# Patient Record
Sex: Male | Born: 1940 | Race: White | Hispanic: No | Marital: Married | Smoking: Former smoker
Health system: Southern US, Community
[De-identification: ages and names within clinical notes are randomized; demographics above are authoritative.]

## PROBLEM LIST (undated history)

## (undated) DIAGNOSIS — R0989 Other specified symptoms and signs involving the circulatory and respiratory systems: Secondary | ICD-10-CM

## (undated) DIAGNOSIS — E785 Hyperlipidemia, unspecified: Secondary | ICD-10-CM

## (undated) DIAGNOSIS — Z951 Presence of aortocoronary bypass graft: Secondary | ICD-10-CM

## (undated) DIAGNOSIS — Z87442 Personal history of urinary calculi: Secondary | ICD-10-CM

## (undated) DIAGNOSIS — C4491 Basal cell carcinoma of skin, unspecified: Secondary | ICD-10-CM

## (undated) DIAGNOSIS — H269 Unspecified cataract: Secondary | ICD-10-CM

## (undated) DIAGNOSIS — Z5189 Encounter for other specified aftercare: Secondary | ICD-10-CM

## (undated) DIAGNOSIS — J189 Pneumonia, unspecified organism: Secondary | ICD-10-CM

## (undated) DIAGNOSIS — K5790 Diverticulosis of intestine, part unspecified, without perforation or abscess without bleeding: Secondary | ICD-10-CM

## (undated) DIAGNOSIS — M1990 Unspecified osteoarthritis, unspecified site: Secondary | ICD-10-CM

## (undated) DIAGNOSIS — I10 Essential (primary) hypertension: Secondary | ICD-10-CM

## (undated) DIAGNOSIS — R112 Nausea with vomiting, unspecified: Secondary | ICD-10-CM

## (undated) DIAGNOSIS — T8859XA Other complications of anesthesia, initial encounter: Secondary | ICD-10-CM

## (undated) DIAGNOSIS — Z8489 Family history of other specified conditions: Secondary | ICD-10-CM

## (undated) DIAGNOSIS — I509 Heart failure, unspecified: Secondary | ICD-10-CM

## (undated) DIAGNOSIS — Z9889 Other specified postprocedural states: Secondary | ICD-10-CM

## (undated) DIAGNOSIS — T4145XA Adverse effect of unspecified anesthetic, initial encounter: Secondary | ICD-10-CM

## (undated) DIAGNOSIS — C4490 Unspecified malignant neoplasm of skin, unspecified: Secondary | ICD-10-CM

## (undated) DIAGNOSIS — R911 Solitary pulmonary nodule: Secondary | ICD-10-CM

## (undated) DIAGNOSIS — H3530 Unspecified macular degeneration: Secondary | ICD-10-CM

## (undated) DIAGNOSIS — I7100 Dissection of unspecified site of aorta: Secondary | ICD-10-CM

## (undated) DIAGNOSIS — I712 Thoracic aortic aneurysm, without rupture: Secondary | ICD-10-CM

## (undated) DIAGNOSIS — T7840XA Allergy, unspecified, initial encounter: Secondary | ICD-10-CM

## (undated) DIAGNOSIS — C61 Malignant neoplasm of prostate: Secondary | ICD-10-CM

## (undated) DIAGNOSIS — I499 Cardiac arrhythmia, unspecified: Secondary | ICD-10-CM

## (undated) HISTORY — DX: Unspecified macular degeneration: H35.30

## (undated) HISTORY — PX: OTHER SURGICAL HISTORY: SHX169

## (undated) HISTORY — PX: POLYPECTOMY: SHX149

## (undated) HISTORY — DX: Solitary pulmonary nodule: R91.1

## (undated) HISTORY — DX: Diverticulosis of intestine, part unspecified, without perforation or abscess without bleeding: K57.90

## (undated) HISTORY — DX: Thoracic aortic aneurysm, without rupture: I71.2

## (undated) HISTORY — DX: Hyperlipidemia, unspecified: E78.5

## (undated) HISTORY — DX: Encounter for other specified aftercare: Z51.89

## (undated) HISTORY — DX: Essential (primary) hypertension: I10

## (undated) HISTORY — DX: Dissection of unspecified site of aorta: I71.00

## (undated) HISTORY — DX: Cardiac arrhythmia, unspecified: I49.9

## (undated) HISTORY — DX: Unspecified malignant neoplasm of skin, unspecified: C44.90

## (undated) HISTORY — DX: Allergy, unspecified, initial encounter: T78.40XA

## (undated) HISTORY — PX: COLONOSCOPY: SHX174

## (undated) HISTORY — DX: Pneumonia, unspecified organism: J18.9

## (undated) HISTORY — DX: Unspecified cataract: H26.9

## (undated) HISTORY — DX: Basal cell carcinoma of skin, unspecified: C44.91

## (undated) HISTORY — DX: Other specified symptoms and signs involving the circulatory and respiratory systems: R09.89

## (undated) HISTORY — DX: Malignant neoplasm of prostate: C61

---

## 1993-12-11 HISTORY — PX: PROSTATECTOMY: SHX69

## 1994-08-13 HISTORY — PX: PENILE PROSTHESIS IMPLANT: SHX240

## 1995-03-14 ENCOUNTER — Encounter: Payer: Self-pay | Admitting: Family Medicine

## 1999-05-14 ENCOUNTER — Encounter: Payer: Self-pay | Admitting: Family Medicine

## 1999-05-14 LAB — CONVERTED CEMR LAB: PSA: 0 ng/mL

## 1999-06-14 HISTORY — PX: OTHER SURGICAL HISTORY: SHX169

## 1999-08-21 ENCOUNTER — Other Ambulatory Visit: Admission: RE | Admit: 1999-08-21 | Discharge: 1999-08-21 | Payer: Self-pay | Admitting: Gastroenterology

## 1999-08-21 ENCOUNTER — Encounter (INDEPENDENT_AMBULATORY_CARE_PROVIDER_SITE_OTHER): Payer: Self-pay

## 2000-04-05 ENCOUNTER — Other Ambulatory Visit: Admission: RE | Admit: 2000-04-05 | Discharge: 2000-04-05 | Payer: Self-pay | Admitting: General Surgery

## 2000-05-13 HISTORY — PX: OTHER SURGICAL HISTORY: SHX169

## 2000-06-12 ENCOUNTER — Encounter (INDEPENDENT_AMBULATORY_CARE_PROVIDER_SITE_OTHER): Payer: Self-pay | Admitting: *Deleted

## 2000-06-12 ENCOUNTER — Ambulatory Visit (HOSPITAL_COMMUNITY): Admission: RE | Admit: 2000-06-12 | Discharge: 2000-06-12 | Payer: Self-pay | Admitting: Orthopedic Surgery

## 2000-06-12 ENCOUNTER — Encounter: Payer: Self-pay | Admitting: Orthopedic Surgery

## 2000-12-11 ENCOUNTER — Encounter: Payer: Self-pay | Admitting: Family Medicine

## 2000-12-11 LAB — CONVERTED CEMR LAB: PSA: 0 ng/mL

## 2002-02-10 ENCOUNTER — Encounter: Payer: Self-pay | Admitting: Family Medicine

## 2003-03-14 ENCOUNTER — Encounter: Payer: Self-pay | Admitting: Family Medicine

## 2003-03-14 LAB — CONVERTED CEMR LAB: PSA: 0 ng/mL

## 2004-07-28 ENCOUNTER — Ambulatory Visit: Payer: Self-pay | Admitting: Family Medicine

## 2005-05-14 ENCOUNTER — Ambulatory Visit: Payer: Self-pay | Admitting: Family Medicine

## 2005-05-14 LAB — CONVERTED CEMR LAB: PSA: 0 ng/mL

## 2005-05-15 ENCOUNTER — Ambulatory Visit: Payer: Self-pay | Admitting: Family Medicine

## 2005-06-27 ENCOUNTER — Ambulatory Visit: Payer: Self-pay | Admitting: Family Medicine

## 2005-10-11 HISTORY — PX: WISDOM TOOTH EXTRACTION: SHX21

## 2006-01-23 ENCOUNTER — Ambulatory Visit: Payer: Self-pay | Admitting: Family Medicine

## 2006-05-16 ENCOUNTER — Ambulatory Visit: Payer: Self-pay | Admitting: Family Medicine

## 2006-05-28 ENCOUNTER — Ambulatory Visit: Payer: Self-pay | Admitting: Family Medicine

## 2006-06-13 HISTORY — PX: OTHER SURGICAL HISTORY: SHX169

## 2006-07-08 ENCOUNTER — Ambulatory Visit: Payer: Self-pay | Admitting: Family Medicine

## 2006-07-09 ENCOUNTER — Ambulatory Visit: Payer: Self-pay | Admitting: Family Medicine

## 2007-05-07 ENCOUNTER — Encounter: Payer: Self-pay | Admitting: Family Medicine

## 2007-05-07 DIAGNOSIS — I10 Essential (primary) hypertension: Secondary | ICD-10-CM

## 2007-05-07 DIAGNOSIS — E781 Pure hyperglyceridemia: Secondary | ICD-10-CM

## 2007-06-09 ENCOUNTER — Ambulatory Visit: Payer: Self-pay | Admitting: Family Medicine

## 2007-06-09 LAB — CONVERTED CEMR LAB
ALT: 30 units/L (ref 0–53)
AST: 36 units/L (ref 0–37)
Alkaline Phosphatase: 40 units/L (ref 39–117)
BUN: 15 mg/dL (ref 6–23)
Basophils Relative: 0.4 % (ref 0.0–1.0)
Bilirubin, Direct: 0.2 mg/dL (ref 0.0–0.3)
CO2: 33 meq/L — ABNORMAL HIGH (ref 19–32)
Calcium: 9.6 mg/dL (ref 8.4–10.5)
Creatinine, Ser: 0.9 mg/dL (ref 0.4–1.5)
Eosinophils Relative: 8.6 % — ABNORMAL HIGH (ref 0.0–5.0)
Glucose, Bld: 99 mg/dL (ref 70–99)
HDL: 36.9 mg/dL — ABNORMAL LOW (ref >39.0)
Hemoglobin: 16 g/dL (ref 13.0–17.0)
LDL Cholesterol: 94 mg/dL (ref 0–99)
Lymphocytes Relative: 36.4 % (ref 12.0–46.0)
Monocytes Absolute: 0.9 10*3/uL — ABNORMAL HIGH (ref 0.2–0.7)
Monocytes Relative: 11.1 % — ABNORMAL HIGH (ref 3.0–11.0)
Neutro Abs: 3.4 10*3/uL (ref 1.4–7.7)
Platelets: 181 10*3/uL (ref 150–400)
RDW: 13.1 % (ref 11.5–14.6)
Total Bilirubin: 0.9 mg/dL (ref 0.3–1.2)
Total Protein: 7.5 g/dL (ref 6.0–8.3)
VLDL: 14 mg/dL (ref 0–40)
WBC: 7.8 10*3/uL (ref 4.5–10.5)

## 2007-06-11 ENCOUNTER — Ambulatory Visit: Payer: Self-pay | Admitting: Family Medicine

## 2007-06-19 ENCOUNTER — Ambulatory Visit: Payer: Self-pay | Admitting: Family Medicine

## 2007-07-01 ENCOUNTER — Ambulatory Visit: Payer: Self-pay | Admitting: Family Medicine

## 2007-07-01 LAB — FECAL OCCULT BLOOD, GUAIAC: Fecal Occult Blood: NEGATIVE

## 2007-07-02 ENCOUNTER — Encounter (INDEPENDENT_AMBULATORY_CARE_PROVIDER_SITE_OTHER): Payer: Self-pay | Admitting: *Deleted

## 2007-11-03 ENCOUNTER — Telehealth (INDEPENDENT_AMBULATORY_CARE_PROVIDER_SITE_OTHER): Payer: Self-pay | Admitting: *Deleted

## 2008-02-18 ENCOUNTER — Ambulatory Visit: Payer: Self-pay | Admitting: Internal Medicine

## 2008-02-18 LAB — CONVERTED CEMR LAB
Nitrite: NEGATIVE
Specific Gravity, Urine: 1.01
Urobilinogen, UA: 0.2
WBC Urine, dipstick: NEGATIVE

## 2008-03-22 ENCOUNTER — Telehealth (INDEPENDENT_AMBULATORY_CARE_PROVIDER_SITE_OTHER): Payer: Self-pay | Admitting: Internal Medicine

## 2008-04-21 ENCOUNTER — Ambulatory Visit: Payer: Self-pay | Admitting: Gastroenterology

## 2008-05-05 ENCOUNTER — Encounter: Payer: Self-pay | Admitting: Gastroenterology

## 2008-05-05 ENCOUNTER — Ambulatory Visit: Payer: Self-pay | Admitting: Gastroenterology

## 2008-05-07 ENCOUNTER — Encounter: Payer: Self-pay | Admitting: Gastroenterology

## 2008-05-26 ENCOUNTER — Encounter: Payer: Self-pay | Admitting: Family Medicine

## 2008-06-14 ENCOUNTER — Ambulatory Visit: Payer: Self-pay | Admitting: Family Medicine

## 2008-06-14 LAB — CONVERTED CEMR LAB
ALT: 31 units/L (ref 0–53)
Albumin: 4.4 g/dL (ref 3.5–5.2)
Alkaline Phosphatase: 41 units/L (ref 39–117)
BUN: 15 mg/dL (ref 6–23)
Basophils Relative: 0.6 % (ref 0.0–3.0)
Bilirubin, Direct: 0.2 mg/dL (ref 0.0–0.3)
CO2: 32 meq/L (ref 19–32)
Calcium: 9.6 mg/dL (ref 8.4–10.5)
Cholesterol: 154 mg/dL (ref 0–200)
Glucose, Bld: 93 mg/dL (ref 70–99)
LDL Cholesterol: 99 mg/dL (ref 0–99)
Lymphocytes Relative: 32.1 % (ref 12.0–46.0)
MCHC: 33.2 g/dL (ref 30.0–36.0)
Monocytes Relative: 9.9 % (ref 3.0–12.0)
Neutrophils Relative %: 47.8 % (ref 43.0–77.0)
RBC: 5.66 M/uL (ref 4.22–5.81)
Sodium: 144 meq/L (ref 135–145)
Total CHOL/HDL Ratio: 4
Total Protein: 7.7 g/dL (ref 6.0–8.3)
VLDL: 17 mg/dL (ref 0–40)
WBC: 9.7 10*3/uL (ref 4.5–10.5)

## 2008-06-15 ENCOUNTER — Ambulatory Visit: Payer: Self-pay | Admitting: Family Medicine

## 2008-08-13 DIAGNOSIS — I7100 Dissection of unspecified site of aorta: Secondary | ICD-10-CM

## 2008-08-13 HISTORY — DX: Dissection of unspecified site of aorta: I71.00

## 2008-08-17 ENCOUNTER — Telehealth: Payer: Self-pay | Admitting: Family Medicine

## 2008-12-08 ENCOUNTER — Ambulatory Visit: Payer: Self-pay | Admitting: Family Medicine

## 2008-12-08 LAB — CONVERTED CEMR LAB: PSA: 0 ng/mL — ABNORMAL LOW (ref 0.10–4.00)

## 2008-12-27 ENCOUNTER — Ambulatory Visit: Payer: Self-pay | Admitting: Family Medicine

## 2009-04-03 ENCOUNTER — Encounter: Payer: Self-pay | Admitting: Internal Medicine

## 2009-04-03 ENCOUNTER — Emergency Department (HOSPITAL_COMMUNITY): Admission: EM | Admit: 2009-04-03 | Discharge: 2009-04-03 | Payer: Self-pay | Admitting: Emergency Medicine

## 2009-04-03 ENCOUNTER — Encounter: Payer: Self-pay | Admitting: Family Medicine

## 2009-04-03 DIAGNOSIS — I359 Nonrheumatic aortic valve disorder, unspecified: Secondary | ICD-10-CM

## 2009-04-03 DIAGNOSIS — I2510 Atherosclerotic heart disease of native coronary artery without angina pectoris: Secondary | ICD-10-CM

## 2009-04-06 ENCOUNTER — Encounter: Payer: Self-pay | Admitting: Family Medicine

## 2009-04-09 ENCOUNTER — Encounter: Payer: Self-pay | Admitting: Family Medicine

## 2009-04-13 ENCOUNTER — Encounter: Payer: Self-pay | Admitting: Family Medicine

## 2009-04-20 ENCOUNTER — Ambulatory Visit: Payer: Self-pay | Admitting: Family Medicine

## 2009-04-21 ENCOUNTER — Ambulatory Visit: Payer: Self-pay | Admitting: Internal Medicine

## 2009-04-23 ENCOUNTER — Emergency Department (HOSPITAL_COMMUNITY): Admission: EM | Admit: 2009-04-23 | Discharge: 2009-04-23 | Payer: Self-pay | Admitting: Emergency Medicine

## 2009-04-23 ENCOUNTER — Ambulatory Visit: Payer: Self-pay | Admitting: Internal Medicine

## 2009-04-25 ENCOUNTER — Encounter: Payer: Self-pay | Admitting: Cardiology

## 2009-04-25 ENCOUNTER — Ambulatory Visit: Payer: Self-pay | Admitting: Internal Medicine

## 2009-04-25 DIAGNOSIS — I7100 Dissection of unspecified site of aorta: Secondary | ICD-10-CM

## 2009-04-26 ENCOUNTER — Telehealth: Payer: Self-pay | Admitting: Internal Medicine

## 2009-04-29 ENCOUNTER — Encounter: Payer: Self-pay | Admitting: Internal Medicine

## 2009-05-03 ENCOUNTER — Encounter: Payer: Self-pay | Admitting: Internal Medicine

## 2009-05-03 ENCOUNTER — Ambulatory Visit: Payer: Self-pay

## 2009-05-04 ENCOUNTER — Encounter: Payer: Self-pay | Admitting: Family Medicine

## 2009-05-04 ENCOUNTER — Encounter: Payer: Self-pay | Admitting: Internal Medicine

## 2009-05-05 ENCOUNTER — Encounter (INDEPENDENT_AMBULATORY_CARE_PROVIDER_SITE_OTHER): Payer: Self-pay | Admitting: *Deleted

## 2009-05-05 ENCOUNTER — Telehealth: Payer: Self-pay | Admitting: Internal Medicine

## 2009-05-05 ENCOUNTER — Encounter: Payer: Self-pay | Admitting: Internal Medicine

## 2009-05-11 ENCOUNTER — Encounter (INDEPENDENT_AMBULATORY_CARE_PROVIDER_SITE_OTHER): Payer: Self-pay | Admitting: *Deleted

## 2009-05-11 ENCOUNTER — Ambulatory Visit: Payer: Self-pay | Admitting: Internal Medicine

## 2009-05-11 DIAGNOSIS — I4892 Unspecified atrial flutter: Secondary | ICD-10-CM

## 2009-05-11 DIAGNOSIS — I429 Cardiomyopathy, unspecified: Secondary | ICD-10-CM

## 2009-05-12 ENCOUNTER — Encounter: Payer: Self-pay | Admitting: Internal Medicine

## 2009-05-17 ENCOUNTER — Ambulatory Visit: Payer: Self-pay | Admitting: Family Medicine

## 2009-05-19 ENCOUNTER — Ambulatory Visit: Payer: Self-pay | Admitting: Cardiovascular Disease

## 2009-05-27 ENCOUNTER — Ambulatory Visit: Payer: Self-pay | Admitting: Internal Medicine

## 2009-05-27 DIAGNOSIS — R0989 Other specified symptoms and signs involving the circulatory and respiratory systems: Secondary | ICD-10-CM | POA: Insufficient documentation

## 2009-05-30 ENCOUNTER — Ambulatory Visit: Payer: Self-pay | Admitting: Family Medicine

## 2009-06-01 ENCOUNTER — Encounter: Payer: Self-pay | Admitting: Internal Medicine

## 2009-06-13 ENCOUNTER — Encounter: Payer: Self-pay | Admitting: Internal Medicine

## 2009-06-21 ENCOUNTER — Ambulatory Visit: Payer: Self-pay | Admitting: Family Medicine

## 2009-06-21 LAB — CONVERTED CEMR LAB
Albumin: 3.9 g/dL (ref 3.5–5.2)
Alkaline Phosphatase: 42 units/L (ref 39–117)
BUN: 15 mg/dL (ref 6–23)
Basophils Absolute: 0 10*3/uL (ref 0.0–0.1)
CO2: 30 meq/L (ref 19–32)
Calcium: 9.1 mg/dL (ref 8.4–10.5)
Cholesterol: 135 mg/dL (ref 0–200)
Creatinine, Ser: 1 mg/dL (ref 0.4–1.5)
Glucose, Bld: 91 mg/dL (ref 70–99)
HDL: 34.2 mg/dL — ABNORMAL LOW (ref >39.00)
Hemoglobin: 13.1 g/dL (ref 13.0–17.0)
Lymphocytes Relative: 18.9 % (ref 12.0–46.0)
Monocytes Relative: 11.6 % (ref 3.0–12.0)
Neutro Abs: 4.9 10*3/uL (ref 1.4–7.7)
RBC: 4.53 M/uL (ref 4.22–5.81)
RDW: 14.4 % (ref 11.5–14.6)
Total Protein: 7.8 g/dL (ref 6.0–8.3)
Triglycerides: 73 mg/dL (ref 0.0–149.0)

## 2009-06-23 ENCOUNTER — Telehealth: Payer: Self-pay | Admitting: Internal Medicine

## 2009-06-28 ENCOUNTER — Ambulatory Visit: Payer: Self-pay | Admitting: Family Medicine

## 2009-07-05 ENCOUNTER — Ambulatory Visit: Payer: Self-pay | Admitting: Internal Medicine

## 2009-07-06 ENCOUNTER — Telehealth (INDEPENDENT_AMBULATORY_CARE_PROVIDER_SITE_OTHER): Payer: Self-pay | Admitting: *Deleted

## 2009-07-08 ENCOUNTER — Encounter: Payer: Self-pay | Admitting: Internal Medicine

## 2009-07-11 ENCOUNTER — Ambulatory Visit: Payer: Self-pay

## 2009-07-11 ENCOUNTER — Encounter (HOSPITAL_COMMUNITY): Admission: RE | Admit: 2009-07-11 | Discharge: 2009-08-11 | Payer: Self-pay | Admitting: Internal Medicine

## 2009-07-11 ENCOUNTER — Ambulatory Visit: Payer: Self-pay | Admitting: Cardiology

## 2009-07-13 ENCOUNTER — Encounter: Payer: Self-pay | Admitting: Internal Medicine

## 2009-07-15 ENCOUNTER — Telehealth: Payer: Self-pay | Admitting: Internal Medicine

## 2009-07-18 ENCOUNTER — Ambulatory Visit: Payer: Self-pay | Admitting: Cardiology

## 2009-07-18 ENCOUNTER — Ambulatory Visit (HOSPITAL_COMMUNITY): Admission: RE | Admit: 2009-07-18 | Discharge: 2009-07-18 | Payer: Self-pay | Admitting: Cardiology

## 2009-08-01 ENCOUNTER — Telehealth: Payer: Self-pay | Admitting: Family Medicine

## 2009-09-01 ENCOUNTER — Ambulatory Visit: Payer: Self-pay | Admitting: Internal Medicine

## 2009-09-20 ENCOUNTER — Encounter: Payer: Self-pay | Admitting: Internal Medicine

## 2009-09-20 ENCOUNTER — Ambulatory Visit: Payer: Self-pay

## 2009-09-29 ENCOUNTER — Ambulatory Visit: Payer: Self-pay | Admitting: Internal Medicine

## 2009-11-22 ENCOUNTER — Ambulatory Visit: Payer: Self-pay | Admitting: Internal Medicine

## 2009-11-22 DIAGNOSIS — I493 Ventricular premature depolarization: Secondary | ICD-10-CM

## 2009-12-05 ENCOUNTER — Telehealth: Payer: Self-pay | Admitting: Internal Medicine

## 2010-01-12 ENCOUNTER — Ambulatory Visit: Payer: Self-pay | Admitting: Family Medicine

## 2010-02-28 ENCOUNTER — Ambulatory Visit: Payer: Self-pay

## 2010-02-28 ENCOUNTER — Encounter: Payer: Self-pay | Admitting: Internal Medicine

## 2010-03-21 ENCOUNTER — Encounter (INDEPENDENT_AMBULATORY_CARE_PROVIDER_SITE_OTHER): Payer: Self-pay | Admitting: *Deleted

## 2010-03-21 ENCOUNTER — Ambulatory Visit: Payer: Self-pay | Admitting: Internal Medicine

## 2010-04-03 ENCOUNTER — Telehealth: Payer: Self-pay | Admitting: Internal Medicine

## 2010-06-21 ENCOUNTER — Telehealth (INDEPENDENT_AMBULATORY_CARE_PROVIDER_SITE_OTHER): Payer: Self-pay | Admitting: *Deleted

## 2010-06-22 ENCOUNTER — Ambulatory Visit: Payer: Self-pay | Admitting: Family Medicine

## 2010-06-22 LAB — CONVERTED CEMR LAB
Albumin: 4.3 g/dL (ref 3.5–5.2)
BUN: 14 mg/dL (ref 6–23)
Bilirubin, Direct: 0.1 mg/dL (ref 0.0–0.3)
CO2: 29 meq/L (ref 19–32)
Calcium: 9.6 mg/dL (ref 8.4–10.5)
Creatinine, Ser: 0.9 mg/dL (ref 0.4–1.5)
Glucose, Bld: 84 mg/dL (ref 70–99)
HDL: 34.9 mg/dL — ABNORMAL LOW (ref >39.00)
PSA: 0 ng/mL — ABNORMAL LOW (ref 0.10–4.00)
Total CHOL/HDL Ratio: 4
Total Protein: 7.3 g/dL (ref 6.0–8.3)
Triglycerides: 62 mg/dL (ref 0.0–149.0)

## 2010-06-29 ENCOUNTER — Ambulatory Visit: Payer: Self-pay | Admitting: Family Medicine

## 2010-08-11 ENCOUNTER — Telehealth: Payer: Self-pay | Admitting: Internal Medicine

## 2010-09-10 LAB — CONVERTED CEMR LAB
Calcium: 8.8 mg/dL (ref 8.4–10.5)
Creatinine, Ser: 0.84 mg/dL (ref 0.40–1.50)
HCT: 34.5 % — ABNORMAL LOW (ref 39.0–52.0)
MCV: 92 fL (ref 78.0–100.0)
Platelets: 535 10*3/uL — ABNORMAL HIGH (ref 150–400)
RDW: 14.3 % (ref 11.5–15.5)

## 2010-09-14 NOTE — Assessment & Plan Note (Signed)
Summary: F3M/AMD-1:30 APPT   Visit Type:  Follow-up Primary Provider:  Shaune Leeks MD  CC:  no complaints.  History of Present Illness: Ray Fowler is a 70 y/o male with h/o HTN and hyperlipidemia. 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 CT showed extension of the dissection from the aortic root all the way down to the femoral arteries. Underwent aortic root replacement of hemiarch repair and aortic valve resuspension. Post-op course complicated by persistent HTN and atrial flutter for which we treated with amio (stopped in January).  ECHO showed EF 45-50%. Had cardiac CT in 12/10 after abnormal myvoiew. Coronaries normal except for 25% RCA. 6mm stable pulmonary nodule.   Echo last week (02/2010) EF 55-60% with grade 1 diastolic dysfx and previous AO dissection flap.  Here for routine follow-up. Overall doing very well. Very active. No CP or SOB. No longer taking diuretic. BP remain labile. He has been arranging timing of meds to correspond with BP spikes. SBP ranges 107-150. Average is typically 130. When in [place] had episode and symptomatic hypotension. Is oding better about keeping up with fluids.  Continues to have skipped beats. Come in clusters. Notices at rest but not with activity. No sustained palpitation. Drinks decaf tea and coffee.    Current Medications (verified): 1)  Lipitor 10 Mg Tabs (Atorvastatin Calcium) .... Take 1 Tablet By Mouth At Bedtime 2)  Tricor 145 Mg Tabs (Fenofibrate) .Marland Kitchen.. 1 Tablet By Mouth Once Every Other Day 3)  Mucinex .... As Needed 4)  Aspirin 81 Mg Tbec (Aspirin) .... Take One Tablet By Mouth Every Other Day 5)  Multivitamins  Tabs (Multiple Vitamin) .... Take One By Mouth Daily 6)  Lisinopril 20 Mg Tabs (Lisinopril) .... Take One Tablet By Mouth Twice Daily 7)  Coreg 25 Mg Tabs (Carvedilol) .Marland Kitchen.. 1 Tab By Mouth Twice A Day 8)  Amlodipine Besylate 5 Mg Tabs (Amlodipine Besylate) .... Take One Tablet By Mouth  Daily 9)  Flovent Hfa 110 Mcg/act Aero (Fluticasone Propionate  Hfa) .... As Needed 10)  Flonase 50 Mcg/act Susp (Fluticasone Propionate) .... 2 Once Daily As Needed 11)  Lutein 15-0.7 Mg Caps (Lutein-Zeaxanthin) .... Qd  Allergies (verified): No Known Drug Allergies  Past History:  Past Medical History: Last updated: 11/22/2009 Prostate cancer, hx of Diverticulosis, colon Hypertension Allergic rhinitis Type A Aortic dissection s/p hemi arch replacement and AoV resuspension 8/10    --c/b post-op atrial flutter. treated with amio Echo 45-50% mild MR Cardiac CT RCA 25% otherwise normal R carotid bruit: carotid u/s 2/11 normal Pulmonary nodule - stable on CT 2/11  Review of Systems       As per HPI and past medical history; otherwise all systems negative.   Vital Signs:  Patient profile:   70 year old male Height:      72 inches Weight:      168.50 pounds BMI:     22.94 Pulse rate:   76 / minute BP sitting:   144 / 95  (left arm) Cuff size:   regular  Vitals Entered By: Caralee Ates CMA (March 21, 2010 1:39 PM)  Physical Exam  General:  well appearing. no resp difficulty HEENT: normal Neck: supple. JVP flat. Carotids 2+ bilat; R bruit.  Cor: PMI nondisplaced. Iregular rate & rhythm. No rubs, gallops, murmur. Lungs: clear Abdomen: soft, nontender, nondistended. Good bowel sounds. Extremities: no cyanosis, clubbing, rash, edema Neuro: alert & orientedx3, cranial nerves grossly intact. moves all 4 extremities w/o difficulty.  affect pleasant    Impression & Recommendations:  Problem # 1:  AORTIC DISSECTION (ICD-441.00) Stable. No AI on echo. 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 Also discussed need for tight BP control. Will check renal u/s with next echo to look at kidney.  Problem # 2:  HYPERTENSION (ICD-401.9) BP seems to be drifting up. Previously had  symptomatic hypotension. Will calibrate his cuff then have him take daily BPs for several weeks to see if we need to adjust.   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 Reassured him that these are benign.  Other Orders: EKG w/ Interpretation (93000)  Patient Instructions: 1)  Your physician wants you to follow-up in:  6 months  You will receive a reminder letter in the mail two months in advance. If you don't receive a letter, please call our office to schedule the follow-up appointment. 2)  Your physician has requested that you regularly monitor and record your blood pressure readings at home.  Please use the same machine at the same time of day to check your readings and record them to bring to your follow-up visit.

## 2010-09-14 NOTE — Progress Notes (Signed)
Summary: RX  Phone Note Refill Request Call back at Home Phone [phone] Message from:  SELF on December 05, 2009 2:27 PM  Refills Requested: Medication #1:  COREG 25 MG TABS 1 tab by mouth twice a day [facility]  Initial call taken by: Harlon Flor,  December 05, 2009 2:28 PM    Prescriptions: COREG 25 MG TABS (CARVEDILOL) 1 tab by mouth twice a day  #60 x 6   Entered by:   Charlena Cross, RN, BSN   Authorized by:   Dolores Patty, MD, [facility]   Signed by:   Charlena Cross, RN, BSN on 12/05/2009   Method used:   Electronically to        [facility]retail[facility]       [address]       [place], [place]  [address]       Ph: [phone]       Fax: [phone]   RxID:   [id number]

## 2010-09-14 NOTE — Assessment & Plan Note (Signed)
Summary: YEARLY PHYSICAL/SCHALLER PT   Vital Signs:  Patient profile:   70 year old Fowler Height:      72 inches Weight:      168.50 pounds BMI:     22.94 Temp:     98.2 degrees F oral Pulse rate:   84 / minute Pulse rhythm:   regular BP sitting:   102 / 72  (left arm) Cuff size:   regular  Vitals Entered By: Delilah Shan CMA Duncan Dull) (11/Ray/2011 11:18 AM) CC: Yearly PE   History of Present Illness: H/o aortic dissection, followed by cards, s/p repair.  We talked about renal u/s today since it went down to femoral artery.  No CP/SOB.    Seasonal allergies:  flonase/flovent during ragweed and pollen season, currently.    Elevated Cholesterol: Using medications without problems:yes Muscle aches: no Other complaints:no  Hypertension:      Using medication without problems or lightheadedness: yes- occ, mild and self-limited orthostasis during periods of relative hypovolemia Chest pain with exertion:no Edema:no Short of breath:no Other issues: no  H/o prostate CA s/p resection.  Occ incontinence with stress, but o/w doing well .  labs reviewed with patient.   Allergies (verified): 1)  ! Aspirin  Past History:  Past Medical History: Last updated: 11/22/2009 Prostate cancer, hx of Diverticulosis, colon Hypertension Allergic rhinitis Type A Aortic dissection s/p hemi arch replacement and AoV resuspension 8/10    --c/b post-op atrial flutter. treated with amio Echo 45-50% mild MR Cardiac CT RCA 25% otherwise normal R carotid bruit: carotid u/s 2/11 normal Pulmonary nodule - stable on CT 2/11  Family History: Last updated: 11/Ray/11 Father: Died at age 32 of chronic kidney disease with coronary disease and strokes due to "infection" from [place] Mother: dec 92 5/10  CHF hypertension Sister A  Father, CABG BP + HTN, mother Diab + DM- Mother's side CA + Self Stroke + father  Social History: Last updated: 11/Ray/11 Marital Status: Married,  1964 Children: 3 Occupation: [employer] in maintenance, RETIRED No tobacco currently, none since 1995 alcohol: rare enjoys time with grandkids  Past Surgical History: Radical Prostatectomy (Dr Isabel Caprice) 12/1993 Penile Implant 1996 Flex-- Polyp at 38 cm, Int/Ext Hemm divertics Russella Dar) 11/00 Axillary mass removal, neurofibroma - ongoing left hand numbness  (Kuzma) 10/01 Colonoscopy, polyp Russella Dar) 08/21/99 Colonoscopy polyps, divertics, int hemms '09 05/12/03 Colonoscopy polyps, divertics, int hemms  '12  05/05/08    3 yrs. HOSP [facility]  Thoracic Aortic Dissection Pericardial Tamponade Aortic Regurg 8/22-8/28/2010 Aortic Hemiarch Repl, Aortic Resusp Aortic Valve w/ Repl of Ascending Aorta nad Hemiarch w/Graft 04/03/2009  Family History: Father: Died at age 58 of chronic kidney disease with coronary disease and strokes due to "infection" from [place] Mother: dec 92 5/10  CHF hypertension Sister A  Father, CABG BP + HTN, mother Diab + DM- Mother's side CA + Self Stroke + father  Social History: Marital Status: Married, 1964 Children: 3 Occupation: [employer] in maintenance, RETIRED No tobacco currently, none since 1995 alcohol: rare enjoys time with grandkids  Review of Systems       See HPI.  Otherwise negative.    Physical Exam  General:  GEN: nad, alert and oriented HEENT: mucous membranes moist, nasal epithelium injectec NECK: supple w/o LA, R bruit noted CV: rrr.  scar healed PULM: ctab, no inc wob, no bruit ABD: soft, +bs EXT: no edema SKIN: no acute rash  Rectal:  No external abnormalities noted. Normal sphincter tone. No  rectal masses or tenderness.No prostate tissue felt . stool heme neg.    Impression & Recommendations:  Problem # 1:  BRUIT (ICD-785.9) right, prev u/s done and no change on exam.   Problem # 2:  ALLERGIC RHINITIS, SEASONAL (ICD-477.9) No change in meds.  His updated medication list for this problem includes:     Flonase 50 Mcg/act Susp (Fluticasone propionate) .Marland Kitchen... 2 once daily as needed  Problem # 3:  HYPERTENSION (ICD-401.9) No change in meds.  Controlled.  His updated medication list for this problem includes:    Lisinopril 20 Mg Tabs (Lisinopril) .Marland Kitchen... Take one tablet by mouth twice daily    Coreg 25 Mg Tabs (Carvedilol) .Marland Kitchen... 1 tab by mouth twice a day    Amlodipine Besylate 5 Mg Tabs (Amlodipine besylate) .Marland Kitchen... Take one tablet by mouth daily  Problem # 4:  PROSTATE CANCER, HX OF, GRAPEY (ICD-V10.46) 0.00 PSA and normal exam.   Problem # 5:  HYPERTRIGLYCERIDEMIA (ICD-272.1) cont current meds.  His updated medication list for this problem includes:    Lipitor 10 Mg Tabs (Atorvastatin calcium) .Marland Kitchen... Take 1 tablet by mouth at bedtime    Tricor 145 Mg Tabs (Fenofibrate) .Marland Kitchen... 1 tablet by mouth once every other day  Complete Medication List: 1)  Lipitor 10 Mg Tabs (Atorvastatin calcium) .... Take 1 tablet by mouth at bedtime 2)  Tricor 145 Mg Tabs (Fenofibrate) .Marland Kitchen.. 1 tablet by mouth once every other day 3)  Mucinex  .... As needed 4)  Aspirin 81 Mg Tbec (Aspirin) .... Take one tablet by mouth every other day 5)  Multivitamins Tabs (Multiple vitamin) .... Take one by mouth daily 6)  Lisinopril 20 Mg Tabs (Lisinopril) .... Take one tablet by mouth twice daily 7)  Coreg 25 Mg Tabs (Carvedilol) .Marland Kitchen.. 1 tab by mouth twice a day 8)  Amlodipine Besylate 5 Mg Tabs (Amlodipine besylate) .... Take one tablet by mouth daily 9)  Flovent Hfa 110 Mcg/act Aero (Fluticasone propionate  hfa) .... As needed 10)  Flonase 50 Mcg/act Susp (Fluticasone propionate) .... 2 once daily as needed 11)  Lutein 15-0.7 Mg Caps (Lutein-zeaxanthin) .... Qd  Other Orders: Flu Vaccine 76yrs + MEDICARE PATIENTS (G3875) Administration Flu vaccine - MCR (I4332)  Patient Instructions: 1)  Take care.  Glad to see you today.  Please let me know if you want to set up the ultrasound of your kidneys.   2)  I would recheck your  labs in 1 year at a physical.  Let me know if I can do anything in the meantime.    Orders Added: 1)  Est. Patient Level IV [95188] 2)  Flu Vaccine 79yrs + MEDICARE PATIENTS [Q2039] 3)  Administration Flu vaccine - MCR [G0008]    Current Allergies (reviewed today): ! ASPIRIN  Flu Vaccine Consent Questions     Do you have a history of severe allergic reactions to this vaccine? no    Any prior history of allergic reactions to egg and/or gelatin? no    Do you have a sensitivity to the preservative Thimersol? no    Do you have a past history of Guillan-Barre Syndrome? no    Do you currently have an acute febrile illness? no    Have you ever had a severe reaction to latex? no    Vaccine information given and explained to patient? yes    Are you currently pregnant? no    Lot Number:[id number]   Exp Date:02/10/2011   Site Given  Left Deltoid  IMbmedflu Lugene Fuquay CMA Duncan Dull)  June 29, 2010 12:25 PM

## 2010-09-14 NOTE — Assessment & Plan Note (Signed)
Summary: ekg  Nurse Visit   Allergies: No Known Drug Allergies

## 2010-09-14 NOTE — Progress Notes (Signed)
Summary: TEETH CLEANING/PREMEDICATIONS  Phone Note Call from Patient Call back at Home Phone [phone]   Caller: Patient Call For: BENSIMHON Summary of Call: PATIENT IS HAVING TEETH CLEANING TOMORROW.  TODAY HE IS ONE YEAR OUT FROM HIS HEART SURGERY. DOES HE NEED TO BE PREMEDICATED FOR HIS TEETH CLEANING.  PLEASE CALL PT BACK AT HOME IF HE IS NOT THERE HE SAID TO LEAVE MESSAGE ON ANSWERING MACHINE. Initial call taken by: [place] Carbo,  April 03, 2010 9:57 AM  Follow-up for Phone Call        pt had aortic dissection 03/2009 does he need pre medication for teeth cleaning? Benedict Needy, RN  April 03, 2010 10:24 AM   Dr. Gala Romney suggests the pt premedicate with amoxicillin 2000mg  by mouth x1 dose. LMOM TCB  Follow-up by: Benedict Needy, RN,  April 03, 2010 3:13 PM    New/Updated Medications: AMOXICILLIN 500 MG CAPS (AMOXICILLIN) Take 4 tablets by mouth before dental work. For a total of 2000mg  Prescriptions: AMOXICILLIN 500 MG CAPS (AMOXICILLIN) Take 4 tablets by mouth before dental work. For a total of 2000mg   #4 x 0   Entered by:   Benedict Needy, RN   Authorized by:   Dolores Patty, MD, [facility]   Signed by:   Benedict Needy, RN on 04/03/2010   Method used:   Electronically to        [facility] ([facility]ail[facility]       [address]       [place], [place]  [address]       Ph: [phone]       Fax: [phone]   RxID:   [id number]

## 2010-09-14 NOTE — Assessment & Plan Note (Signed)
Summary: f25m   Visit Type:  Follow-up Primary Provider:  Shaune Leeks MD  CC:  no complaints.  History of Present Illness: Ray Fowler is a 70 y/o male with h/o HTN and hyperlipidemia. 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 CT showed extension of the dissection from the aortic root all the way down to the femoral arteries. Underwent aortic root replacement of hemiarch repair and aortic valve resuspension. Post-op course complicated by persistent HTN and atrial flutter for which we treated with amio (stopped in january).  ECHO showed EF 45-50%. Had cardiac CT in 12/10 after abnormal myvoiew. Coronaries normal except for 25% RCA. 6mm stable pulmonary nodule.   Here for routine follow-up. Overall doing very well. Very active. No CP or SOB. No longer taking diuretic. BP a bit labile. Averages 120/70. Main complaint is skipped beats. Come in clusters. Notices at rest but not with activity. No sustained palpitation. Drinks decaf tea and coffee. If he doesn't drink enough fluids can get lightheaded.     Current Medications (verified): 1)  Lipitor 10 Mg Tabs (Atorvastatin Calcium) .... Take 1 Tablet By Mouth At Bedtime 2)  Tricor 145 Mg Tabs (Fenofibrate) .Marland Kitchen.. 1 Tablet By Mouth Once Every Other Day 3)  Hydrochlorothiazide 12.5 Mg Tabs (Hydrochlorothiazide) .... Take One Tablet By Mouth Daily - On Hold 4)  Mucinex .... As Needed 5)  Aspirin 81 Mg Tbec (Aspirin) .... Take One Tablet By Mouth Daily 6)  Multivitamins  Tabs (Multiple Vitamin) .... Take One By Mouth Daily 7)  Lisinopril 20 Mg Tabs (Lisinopril) .... Take One Tablet By Mouth Twice Daily 8)  Coreg 25 Mg Tabs (Carvedilol) .Marland Kitchen.. 1 Tab By Mouth Twice A Day 9)  Amlodipine Besylate 5 Mg Tabs (Amlodipine Besylate) .... Take One Tablet By Mouth Daily 10)  Flovent Hfa 110 Mcg/act Aero (Fluticasone Propionate  Hfa) .... As Needed 11)  Flonase 50 Mcg/act Susp (Fluticasone Propionate) .... 2 Once Daily As  Needed 12)  Luteine .Marland Kitchen.. 1 By Mouth Daily  Allergies (verified): No Known Drug Allergies  Past History:  Past Medical History: Prostate cancer, hx of Diverticulosis, colon Hypertension Allergic rhinitis Type A Aortic dissection s/p hemi arch replacement and AoV resuspension 8/10    --c/b post-op atrial flutter. treated with amio Echo 45-50% mild MR Cardiac CT RCA 25% otherwise normal R carotid bruit: carotid u/s 2/11 normal Pulmonary nodule - stable on CT 2/11  Review of Systems       As per HPI and past medical history; otherwise all systems negative.   Vital Signs:  Patient profile:   70 year old male Height:      72 inches Weight:      165 pounds BMI:     22.46 Pulse rate:   70 / minute Pulse rhythm:   regular BP sitting:   126 / 82  (left arm) Cuff size:   large  Vitals Entered By: Mercer Pod (November 22, 2009 11:16 AM)  Physical Exam  General:  Gen: well appearing. no resp difficulty HEENT: normal Neck: supple. JVP flat. Carotids 2+ bilat; R bruit.  Cor: PMI nondisplaced. Regular rate & rhythm. No rubs, gallops, murmur. Lungs: clear Abdomen: soft, nontender, nondistended. Good bowel sounds. Extremities: no cyanosis, clubbing, rash, edema Neuro: alert & orientedx3, cranial nerves grossly intact. moves all 4 extremities w/o difficulty. 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 Reassured him that these are benign.  Avoid caffeine.   Problem # 2:  CARDIOMYOPATHY, SECONDARY (ICD-425.9) Doing well. Volume status looks good. No HF symptoms. Repeat echo in 3 months.  Problem # 3:  ATRIAL FLUTTER (ICD-427.32) Post-op. Resolved. Amiodarone stopped.  Problem # 4:  HYPERTENSION (ICD-401.9) Blood pressure well controlled. Continue current regimen. Continue adequate fluid intake to avoid hypotension.   Problem # 5:  AORTIC DISSECTION (ICD-441.00) Stable s/p  repair.

## 2010-09-14 NOTE — Letter (Signed)
Summary: Nadara Eaton letter  [facility]  [address] [place], [place] [address]   Phone: [phone]  Fax: [phone]       03/21/2010 MRN: [id number]  [facility] [address] [place], [place]  [address]  Dear Mr. Dante Gang,  [facility] [place], and [facility] announce the retirement of Arta Silence, M.D., from full-time practice at the [facility] office effective February 09, 2010 and his plans of returning part-time.  It is important to Dr. Hetty Ely and to our practice that you understand that [facility] has seven physicians in our office for your health care needs.  We will continue to offer the same exceptional care that you have today.    Dr. Hetty Ely has spoken to many of you about his plans for retirement and returning part-time in the fall.   We will continue to work with you through the transition to schedule appointments for you in the office and meet the high standards that [facility] is committed to.   Again, it is with great pleasure that we share the news that Dr. Hetty Ely will return to [facility] in October of 2011 with a reduced schedule.    If you have any questions, or would like to request an appointment with one of our physicians, please call us at [phone] and press the option for Scheduling an appointment.  We take pleasure in providing you with excellent patient care and look forward to seeing you at your next office visit.  Our [facility] Physicians are:  Tillman Abide, M.D. Laurita Quint, M.D. Roxy Manns, M.D. Kerby Nora, M.D. Hannah Beat, M.D. Ruthe Mannan, M.D. We proudly welcomed Raechel Ache, M.D. and Eustaquio Boyden, M.D. to the practice in July/August 2011.  Sincerely,  [facility]

## 2010-09-14 NOTE — Assessment & Plan Note (Signed)
Summary: F2M/AMD   Visit Type:  Follow-up Primary Provider:  Shaune Leeks MD  CC:  no complaints.  History of Present Illness: 70 y/o male with h/o HTN and hyperlipidemia. 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 CT showed extension of the dissection from the aortic root all the way down to the femoral arteries. Underwent aortic root replacement of hemiarch repair and aortic valve resuspension. Post-op course complicated by persistent HTN and atrial flutter for which we have been treating him with amiodarone. We have been following him closing for his HTN. ECHO showed EF 45-50%. Given the emergent nature of his procedure does not apper that he was cathed prior to surgery.  Had cardiac CT in 12/10 agter abnormal myvoiew. Coronaries normal except for 25% RCA. 6mm pulmonary nodule.   Here for routine follow-up. Completed with Heart Track. Checking BPs regularly and SBP ranges from 90-140. Average 110-120. When SBP < 100 gets lightheaded and has to sit down and drink water. Happens nearly every day.  No CP or SOB. No palpitations.   Current Medications (verified): 1)  Lipitor 10 Mg Tabs (Atorvastatin Calcium) .... Take 1 Tablet By Mouth At Bedtime 2)  Tricor 145 Mg Tabs (Fenofibrate) .Marland Kitchen.. 1 Tablet By Mouth Once Every Other Day 3)  Hydrochlorothiazide 25 Mg Tabs (Hydrochlorothiazide) .... Take One Tablet By Mouth Daily. 4)  Mucinex .... As Needed 5)  Aspirin 325 Mg Tabs (Aspirin) .... Take One Every Day 6)  Multivitamins  Tabs (Multiple Vitamin) .... Take One By Mouth Daily 7)  Lisinopril 20 Mg Tabs (Lisinopril) .... Take One Tablet By Mouth Twice Daily 8)  Amiodarone Hcl 200 Mg Tabs (Amiodarone Hcl) .Marland Kitchen.. 1 Tab By Mouth Daily 9)  Coreg 25 Mg Tabs (Carvedilol) .Marland Kitchen.. 1 Tab By Mouth Twice A Day 10)  Amlodipine Besylate 5 Mg Tabs (Amlodipine Besylate) .... Take One Tablet By Mouth Daily 11)  Flovent Hfa 110 Mcg/act Aero (Fluticasone Propionate  Hfa) ....  As Needed 12)  Flonase 50 Mcg/act Susp (Fluticasone Propionate) .... 2 Once Daily As Needed 13)  Luteine .Marland Kitchen.. 1 By Mouth Daily  Allergies: No Known Drug Allergies  Past History:  Past Medical History: Prostate cancer, hx of Diverticulosis, colon Hypertension Allergic rhinitis Type A Aortic dissection s/p hemi arch replacement and AoV resuspension 8/10    --c/b post-op atrial flutter. treated with amio Echo 45-50% mild MR Cardiac CT RCA 25% otherwise normal  Review of Systems       As per HPI and past medical history; otherwise all systems negative.   Vital Signs:  Patient profile:   70 year old male Height:      72 inches Weight:      166.75 pounds BMI:     22.70 Pulse rate:   80 / minute Pulse rhythm:   regular BP sitting:   142 / 80  (right arm) Cuff size:   large  Vitals Entered By: Mercer Pod (September 01, 2009 12:14 PM)  Physical Exam  General:  Gen: well appearing. no resp difficulty HEENT: normal Neck: supple. JVP 7-8. Carotids 2+ bilat; R bruit. No lymphadenopathy or thryomegaly appreciated. Cor: PMI nondisplaced. Regular rate & rhythm. No rubs, gallops, murmur. Lungs: clear Abdomen: soft, nontender, nondistended. Good bowel sounds. Extremities: no cyanosis, clubbing, rash, edema Neuro: alert & orientedx3, cranial nerves grossly intact. moves all 4 extremities w/o difficulty. 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  Continue current regimen. BP and HR well controlled.  Problem # 2:  ATRIAL FLUTTER (ICD-427.32) Maintaing SR on amio. As this was post-op, will stop amio and see how he does.  Problem # 3:  HYPERTENSION (ICD-401.9) BP labile but having multiple episdoes of symptomatic hypotension. will cut HCTZ down to 12.5. As neck veins up mildly would not stop completely at this point. Continue to follow BPs.  Problem # 4:  BRUIT (ICD-785.9) R carotid  bruit supsect this is a result of arch repair. will need f/u ultrasound at next visit.  Patient Instructions: 1)  Your physician recommends that you schedule a follow-up appointment in: 3 months 2)  Your physician has recommended you make the following change in your medication: stop amioadrone, decrease aspirin to 81 mg daily, decrease HCTZ to 12.5 mg daily Prescriptions: HYDROCHLOROTHIAZIDE 12.5 MG TABS (HYDROCHLOROTHIAZIDE) Take one tablet by mouth daily.  #30 x 6   Entered by:   Charlena Cross, RN, BSN   Authorized by:   Dolores Patty, MD, [facility]   Signed by:   Charlena Cross, RN, BSN on 09/01/2009   Method used:   Electronically to        [facility] (retail)       [address]       [place], [place]  [address]       Ph: [phone]       Fax: [phone]   RxID:   [id number]   Appended Document: F2M/AMD    Clinical Lists Changes  Orders: Added new Test order of Carotid Duplex (Carotid Duplex) - Signed

## 2010-09-14 NOTE — Assessment & Plan Note (Signed)
Summary: FOLLOW UP / LFW   Vital Signs:  Patient profile:   70 year old male Weight:      164 pounds Temp:     97.9 degrees F oral Pulse rate:   80 / minute Pulse rhythm:   irregular BP sitting:   140 / 90  (left arm) Cuff size:   regular  Vitals Entered By: Sydell Axon LPN (January 12, 1609 9:56 AM) CC: 6 Month follow-up   History of Present Illness: Pt here for 6 month followup. He has been through rehab and did well. All his tests have been fine and he was on vacation and did lots of walking, as much as ten miles a day. He is trying to cont this...is looking at elliptical. Pounding helps make his knee hurt. He is now off Amiodarone. He had some lightheadedness, even in rehab. He is now off fluid pills. He is trying to be very carefully aware of his fluids.   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 Take 1 Tablet By Mouth At Bedtime 2)  Tricor 145 Mg Tabs (Fenofibrate) .Marland Kitchen.. 1 Tablet By Mouth Once Every Other Day 3)  Hydrochlorothiazide 12.5 Mg Tabs (Hydrochlorothiazide) .... Take One Tablet By Mouth Daily - On Hold 4)  Mucinex .... As Needed 5)  Aspirin 81 Mg Tbec (Aspirin) .... Take One  Tablet By Mouth Daily 6)  Multivitamins  Tabs (Multiple Vitamin) .... Take One By Mouth Daily 7)  Lisinopril 20 Mg Tabs (Lisinopril) .... Take One Tablet By Mouth Twice Daily 8)  Coreg 25 Mg Tabs (Carvedilol) .Marland Kitchen.. 1 Tab By Mouth Twice A Day 9)  Amlodipine Besylate 5 Mg Tabs (Amlodipine Besylate) .... Take One Tablet By Mouth Daily 10)  Flovent Hfa 110 Mcg/act Aero (Fluticasone Propionate  Hfa) .... As Needed 11)  Flonase 50 Mcg/act Susp (Fluticasone Propionate) .... 2 Once Daily As Needed 12)  Luteine .Marland Kitchen.. 1 By Mouth Daily  Allergies: No Known Drug Allergies  Physical Exam  General:  Well-developed,well-nourished,in no acute distress; alert,appropriate and cooperative throughout examination, looks good. Head:  Normocephalic and atraumatic without obvious abnormalities. No apparent alopecia but nml male pattern balding. Eyes:  Conjunctiva clear bilaterally.  Ears:  External ear exam shows no significant lesions or deformities.  Otoscopic examination reveals clear canals, tympanic membranes are intact bilaterally without bulging, retraction, inflammation or discharge. Hearing is grossly normal bilaterally. Cerumen almost occluding bilat. Nose:  External nasal examination shows no deformity or inflammation. Nasal mucosa are pink and moist without lesions or exudates. Mouth:  Oral mucosa and oropharynx without lesions or exudates.  Teeth in good repair. Neck:  No deformities, masses, or tenderness noted. Chest Wall:  No deformities, masses, tenderness or gynecomastia noted. Healing midline sternal incision. Lungs:  Normal respiratory  effort, chest expands symmetrically. Lungs are clear to auscultation, no crackles or wheezes. Heart:  Normal rate and regular rhythm. S1 and S2 normal without gallop,  click, rub or other extra sounds. Noovert murmur heard. Abdomen:  Bowel sounds positive,abdomen soft and non-tender without masses, organomegaly or hernias noted.   Impression &  Recommendations:  Problem # 1:  HYPERTENSION (ICD-401.9) Assessment Unchanged BP up slightly today. Rechecked was 140/80...do not want to increase meds as he sometimes gets lightheaeded. Suggested he move Amlodipine to night time intake which would hellp his AM numbers and poss decrease lightheadedness as well. The following medications were removed from the medication list:    Hydrochlorothiazide 12.5 Mg Tabs (Hydrochlorothiazide) .Marland Kitchen... Take one tablet by mouth daily - on hold His updated medication list for this problem includes:    Lisinopril 20 Mg Tabs (Lisinopril) .Marland Kitchen... Take one tablet by mouth twice daily    Coreg 25 Mg Tabs (Carvedilol) .Marland Kitchen... 1 tab by mouth twice a day    Amlodipine Besylate 5 Mg Tabs (Amlodipine besylate) .Marland Kitchen... Take one tablet by mouth daily  BP today: 140/90 Prior BP: 126/82 (11/22/2009)  Labs Reviewed: K+: 4.0 (06/21/2009) Creat: : 1.0 (06/21/2009)   Chol: 135 (06/21/2009)   HDL: 34.20 (06/21/2009)   LDL: 86 (06/21/2009)   TG: 73.0 (06/21/2009)  Problem # 2:  ATRIAL FLUTTER (ICD-427.32) Assessment: Improved Sounds NSR this AM. Cont curr regimen. His updated medication list for this problem includes:    Aspirin 81 Mg Tbec (Aspirin) .Marland Kitchen... Take one tablet by mouth every other day    Coreg 25 Mg Tabs (Carvedilol) .Marland Kitchen... 1 tab by mouth twice a day    Amlodipine Besylate 5 Mg Tabs (Amlodipine besylate) .Marland Kitchen... Take one tablet by mouth daily  Complete Medication List: 1)  Lipitor 10 Mg Tabs (Atorvastatin calcium) .... Take 1 tablet by mouth at bedtime 2)  Tricor 145 Mg Tabs (Fenofibrate) .Marland Kitchen.. 1 tablet by mouth once every other day 3)  Mucinex  .... As needed 4)  Aspirin 81 Mg Tbec (Aspirin) .... Take one tablet by mouth every other day 5)  Multivitamins Tabs (Multiple vitamin) .... Take one by mouth daily 6)  Lisinopril 20 Mg Tabs (Lisinopril) .... Take one tablet by mouth twice daily 7)  Coreg 25 Mg Tabs (Carvedilol) .Marland Kitchen.. 1 tab by mouth twice a day 8)  Amlodipine  Besylate 5 Mg Tabs (Amlodipine besylate) .... Take one tablet by mouth daily 9)  Flovent Hfa 110 Mcg/act Aero (Fluticasone propionate  hfa) .... As needed 10)  Flonase 50 Mcg/act Susp (Fluticasone propionate) .... 2 once daily as needed 11)  Luteine  .Marland Kitchen[facility]land Kitchen. 1 by mouth daily  Patient Instructions: 1)  RTC 6 mos with Dr Para March for Comp Exam, labs prior.  Current Allergies (reviewed today): No known allergies

## 2010-09-14 NOTE — Progress Notes (Signed)
----   Converted from flag ---- ---- 06/20/2010 11:38 PM, Crawford Givens MD wrote: v10.46 psa 401.1 cmet/lipid  ---- 06/20/2010 4:13 PM, Mills Koller wrote: This patient is scheduled for CPX with you, I need lab orders with dx, please. Thanks, Terri  For Thursday AM ------------------------------

## 2010-09-14 NOTE — Progress Notes (Signed)
Summary: Lipitor  Phone Note Call from Patient Call back at Home Phone [phone]   Caller: Self Call For: Bensimhon Summary of Call: Pt is getting a refill on Lipitor and pt wants to know if he should be concerned that it is now generic. Initial call taken by: Harlon Flor,  August 11, 2010 4:39 PM  Follow-up for Phone Call        Pt just wanted to know if there was any concern with Lipitor going generic and if he should continue. Advised pt to continue, the generic is the exact same medication, the only change is that after 6 months there will be a generic medication cost decrease. Pt ok with this. Follow-up by: Lanny Hurst RN,  August 11, 2010 4:54 PM

## 2010-10-04 ENCOUNTER — Telehealth: Payer: Self-pay | Admitting: Internal Medicine

## 2010-10-05 ENCOUNTER — Telehealth: Payer: Self-pay | Admitting: Family Medicine

## 2010-10-10 NOTE — Progress Notes (Signed)
  Phone Note Call from Patient   Caller: Patient Details for Reason: Dental cleaning Feb. 29, 2012 Summary of Call: Does patient need antibiotic prior to dental cleaning/procedures?  He did get antibiotics in the past for dental work.  Please call @  [phone] to let him know either way. Initial call taken by: Bishop Dublin, CMA,  October 04, 2010 10:43 AM  Follow-up for Phone Call        yes. i would give him abx. Dolores Patty, MD, [facility]  October 04, 2010 11:42 PM      New/Updated Medications: AMOXICILLIN 500 MG CAPS (AMOXICILLIN) take 4 tablets prior to dental work for a total of 2000mg . Prescriptions: AMOXICILLIN 500 MG CAPS (AMOXICILLIN) take 4 tablets prior to dental work for a total of 2000mg .  #4 x 0   Entered by:   Bishop Dublin, CMA   Authorized by:   Dolores Patty, MD, [facility]   Signed by:   Bishop Dublin, CMA on 10/05/2010   Method used:   Historical   RxID:   [id number]   Appended Document:  Phoned to [facility] the Amoxicillin 500 mg 4 tablet prior to dental work for a total of 2000mg .

## 2010-10-10 NOTE — Progress Notes (Signed)
Summary: Rx Flovent  Phone Note Refill Request Call back at [phone] Message from:  [facility] on October 05, 2010 3:24 PM  Refills Requested: Medication #1:  FLOVENT HFA 110 MCG/ACT AERO as needed  Method Requested: Electronic Initial call taken by: Sydell Axon LPN,  October 05, 2010 3:24 PM  Follow-up for Phone Call        sent. Follow-up by: Crawford Givens MD,  October 05, 2010 4:37 PM    New/Updated Medications: FLOVENT HFA 110 MCG/ACT AERO (FLUTICASONE PROPIONATE  HFA) 1 puff two times a day, rinse after use Prescriptions: FLOVENT HFA 110 MCG/ACT AERO (FLUTICASONE PROPIONATE  HFA) 1 puff two times a day, rinse after use  #1 x 12   Entered and Authorized by:   Crawford Givens MD   Signed by:   Crawford Givens MD on 10/05/2010   Method used:   Electronically to        [facility]       [address]       [place], [place]  [address]       Ph: [phone]       Fax: [phone]   RxID:   [id number]

## 2010-10-11 ENCOUNTER — Encounter: Payer: Self-pay | Admitting: Internal Medicine

## 2010-10-17 ENCOUNTER — Encounter: Payer: Self-pay | Admitting: Internal Medicine

## 2010-10-17 ENCOUNTER — Ambulatory Visit (INDEPENDENT_AMBULATORY_CARE_PROVIDER_SITE_OTHER): Payer: MEDICARE | Admitting: Internal Medicine

## 2010-10-17 DIAGNOSIS — I10 Essential (primary) hypertension: Secondary | ICD-10-CM

## 2010-10-30 ENCOUNTER — Other Ambulatory Visit: Payer: Self-pay | Admitting: Internal Medicine

## 2010-10-30 DIAGNOSIS — I7100 Dissection of unspecified site of aorta: Secondary | ICD-10-CM

## 2010-10-31 NOTE — Assessment & Plan Note (Signed)
Summary: F6M/401.1/AMD   Visit Type:  Follow-up Primary Provider:  Shaune Leeks MD  CC:  c/o palpitations. denies chest pain and SOB.[facility]  History of Present Illness: Ray Fowler is a 70 y/o male with h/o HTN, freqeunt PVCs and hyperlipidemia. 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 CT showed extension of the dissection from the aortic root all the way down to the femoral arteries. Underwent aortic root replacement of hemiarch repair and aortic valve resuspension. Post-op course complicated by persistent HTN and atrial flutter for which we treated with amio (stopped in January 2011).   ECHO showed EF 45-50%. Had cardiac CT in 12/10 after abnormal myvoiew. Coronaries normal except for 25% RCA. 6mm stable pulmonary nodule.   Last echo (02/2010) EF 55-60% with grade 1 diastolic dysfx and previous AO dissection flap.  Here for routine follow-up. Overall doing very well. Says he feels good unless he tries to do too much like use a chain saw or weedeater. No CP or significant dyspnea.  Continues to have skipped beats. Come in clusters. Notices at rest but not with activity. No sustained palpitations. SBP typically 130-140. Working out on elliptical 4x/week.   Extensive discussion about his dissection repair. Wants to know if both kidneys are still functioning.   Current Medications (verified): 1)  Lipitor 10 Mg Tabs (Atorvastatin Calcium) .... Take 1 Tablet By Mouth At Bedtime 2)  Tricor 145 Mg Tabs (Fenofibrate) .Marland Kitchen.. 1 Tablet By Mouth Once Every Other Day 3)  Mucinex .... As Needed 4)  Aspirin 81 Mg Tbec (Aspirin) .... Take One Tablet By Mouth Every Other Day 5)  Lisinopril 20 Mg Tabs (Lisinopril) .... Take One Tablet By Mouth Twice Daily 6)  Coreg 25 Mg Tabs (Carvedilol) .Marland Kitchen.. 1 Tab By Mouth Twice A Day 7)  Amlodipine Besylate 5 Mg Tabs (Amlodipine Besylate) .... Take One Tablet By Mouth Daily 8)  Flovent Hfa 110 Mcg/act Aero (Fluticasone Propionate  Hfa)  .Marland Kitchen.. 1 Puff Two Times A Day, Rinse After Use 9)  Flonase 50 Mcg/act Susp (Fluticasone Propionate) .... 2 Once Daily As Needed 10)  Lutein 15-0.7 Mg Caps (Lutein-Zeaxanthin) .... Qd 11)  Amoxicillin 500 Mg Caps (Amoxicillin) .... Take 4 Tablets Prior To Dental Work For A Total of 2000mg .  Allergies (verified): 1)  ! Aspirin  Past History:  Past Medical History: Last updated: 11/22/2009 Prostate cancer, hx of Diverticulosis, colon Hypertension Allergic rhinitis Type A Aortic dissection s/p hemi arch replacement and AoV resuspension 8/10    --c/b post-op atrial flutter. treated with amio Echo 45-50% mild MR Cardiac CT RCA 25% otherwise normal R carotid bruit: carotid u/s 2/11 normal Pulmonary nodule - stable on CT 2/11  Past Surgical History: Last updated: 07-20-10 Radical Prostatectomy (Dr Isabel Caprice) 12/1993 Penile Implant 1996 Flex-- Polyp at 38 cm, Int/Ext Hemm divertics Russella Dar) 11/00 Axillary mass removal, neurofibroma - ongoing left hand numbness  (Kuzma) 10/01 Colonoscopy, polyp Russella Dar) 08/21/99 Colonoscopy polyps, divertics, int hemms '09 05/12/03 Colonoscopy polyps, divertics, int hemms  '12  05/05/08    3 yrs. HOSP [facility]  Thoracic Aortic Dissection Pericardial Tamponade Aortic Regurg 8/22-8/28/2010 Aortic Hemiarch Repl, Aortic Resusp Aortic Valve w/ Repl of Ascending Aorta nad Hemiarch w/Graft 04/03/2009  Family History: Last updated: 2010-07-20 Father: Died at age 83 of chronic kidney disease with coronary disease and strokes due to "infection" from [place] Mother: dec 92 5/10  CHF hypertension Sister A  Father, CABG BP + HTN, mother Diab + DM- Mother's side CA + Self Stroke +  father  Social History: Last updated: 06/29/2010 Marital Status: Married, 1964 Children: 3 Occupation: [employer] in maintenance, RETIRED No tobacco currently, none since 1995 alcohol: rare enjoys time with grandkids  Risk Factors: Alcohol Use: <1 (06/28/2009) Caffeine  Use: 0 (06/28/2009) Exercise: yes (06/28/2009)  Risk Factors: Smoking Status: quit (06/28/2009) Passive Smoke Exposure: no (06/28/2009)  Family History: Reviewed history from 06/29/2010 and no changes required. Father: Died at age 80 of chronic kidney disease with coronary disease and strokes due to "infection" from [place] Mother: dec 92 5/10  CHF hypertension Sister A  Father, CABG BP + HTN, mother Diab + DM- Mother's side CA + Self Stroke + father  Social History: Reviewed history from 06/29/2010 and no changes required. Marital Status: Married, 1964 Children: 3 Occupation: [employer] in maintenance, RETIRED No tobacco currently, none since 1995 alcohol: rare enjoys time with grandkids  Review of Systems       As per HPI and past medical history; otherwise all systems negative.   Vital Signs:  Patient profile:   70 year old male Height:      72 inches Weight:      168.75 pounds BMI:     22.97 Pulse rate:   79 / minute BP sitting:   136 / 82  (left arm) Cuff size:   regular  Vitals Entered By: Lysbeth Galas CMA (October 17, 2010 4:49 PM)  Physical Exam  General:  Well appearing. no resp difficulty. HEENT: normal NECK: supple w/o LA, R bruit noted CV: RRR. no murmur  PULM: ctab, no inc wob, no bruit. mininimal end-exp wheeze ABD: soft, +bs.  nontender EXT: no edema, cyanosis or clubbing SKIN: no acute rash    New Orders:     1)  EKG w/ Interpretation (93000)  Due: 10/17/2010     2)  Abdominal Aorta Duplex (Abd Aorta Duplex)  Due: 10/17/2010     3)  Renal Artery Duplex (Renal Artery Duplex)  Due: 10/17/2010   Impression & Recommendations:  Problem # 1:  HYPERTENSION (ICD-401.9) BP remains elevated. 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 He would like to know if both kidneys still  functioning or just one. We will get renal u/s to evaluate.   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 No further work-up at this point.   Other Orders: EKG w/ Interpretation (93000) Abdominal Aorta Duplex (Abd Aorta Duplex) Renal Artery Duplex (Renal Artery Duplex)  Patient Instructions: 1)  Your physician recommends that you schedule a follow-up appointment in: 6 months 2)  Your physician has recommended you make the following change in your medication: INCREASE Amlodipine 5mg  two times a day. 3)  Your physician has requested that you have an abdominal aorta duplex. During this test, an ultrasound is used to evaluate the aorta. Allow 30 minutes for this exam. Do not eat after midnight the day before and avoid carbonated beverages. There are no restrictions or special instructions. 4)  Your physician has requested that you have a renal artery duplex. During this test, an ultrasound is used to evaluate blood flow to the kidneys. Allow one hour for this exam. Do not eat after midnight the day before and avoid carbonated beverages. Take your medications as you usually do. Prescriptions: AMLODIPINE BESYLATE 5 MG TABS (AMLODIPINE BESYLATE) Take one tablet by mouth two  times a day.  #60 x 6   Entered by:   Lanny Hurst RN   Authorized by:   Dolores Patty, MD, [facility]   Signed by:   Lanny Hurst RN on 10/17/2010   Method used:   Electronically to        [facility]       [address]       [place], [place]  [address]       Ph: [phone]       Fax: [phone]   RxID:   [id number]

## 2010-11-06 ENCOUNTER — Other Ambulatory Visit: Payer: Self-pay | Admitting: Family Medicine

## 2010-11-06 DIAGNOSIS — I714 Abdominal aortic aneurysm, without rupture: Secondary | ICD-10-CM

## 2010-11-07 ENCOUNTER — Encounter: Payer: MEDICARE | Admitting: *Deleted

## 2010-11-07 ENCOUNTER — Encounter (INDEPENDENT_AMBULATORY_CARE_PROVIDER_SITE_OTHER): Payer: MEDICARE | Admitting: *Deleted

## 2010-11-07 DIAGNOSIS — I700 Atherosclerosis of aorta: Secondary | ICD-10-CM

## 2010-11-07 DIAGNOSIS — I723 Aneurysm of iliac artery: Secondary | ICD-10-CM

## 2010-11-07 DIAGNOSIS — I701 Atherosclerosis of renal artery: Secondary | ICD-10-CM

## 2010-11-07 DIAGNOSIS — I714 Abdominal aortic aneurysm, without rupture: Secondary | ICD-10-CM

## 2010-11-17 LAB — DIFFERENTIAL
Basophils Relative: 0 % (ref 0–1)
Lymphocytes Relative: 18 % (ref 12–46)
Lymphs Abs: 1.5 10*3/uL (ref 0.7–4.0)
Monocytes Absolute: 1.1 10*3/uL — ABNORMAL HIGH (ref 0.1–1.0)
Monocytes Relative: 13 % — ABNORMAL HIGH (ref 3–12)
Neutro Abs: 5.6 10*3/uL (ref 1.7–7.7)
Neutrophils Relative %: 66 % (ref 43–77)

## 2010-11-17 LAB — BASIC METABOLIC PANEL
CO2: 30 mEq/L (ref 19–32)
Calcium: 9.2 mg/dL (ref 8.4–10.5)
Creatinine, Ser: 0.9 mg/dL (ref 0.4–1.5)
GFR calc Af Amer: >60 mL/min (ref >60)
GFR calc non Af Amer: >60 mL/min (ref >60)
Sodium: 142 mEq/L (ref 135–145)

## 2010-11-17 LAB — POCT I-STAT, CHEM 8
BUN: 17 mg/dL (ref 6–23)
Chloride: 104 mEq/L (ref 96–112)
Creatinine, Ser: 0.8 mg/dL (ref 0.4–1.5)
Glucose, Bld: 134 mg/dL — ABNORMAL HIGH (ref 70–99)
Hemoglobin: 13.3 g/dL (ref 13.0–17.0)
Potassium: 3.7 mEq/L (ref 3.5–5.1)
Sodium: 140 mEq/L (ref 135–145)

## 2010-11-17 LAB — CBC
Hemoglobin: 12.2 g/dL — ABNORMAL LOW (ref 13.0–17.0)
MCHC: 33.1 g/dL (ref 30.0–36.0)
RBC: 4.11 MIL/uL — ABNORMAL LOW (ref 4.22–5.81)
WBC: 8.4 10*3/uL (ref 4.0–10.5)

## 2010-11-17 LAB — POCT CARDIAC MARKERS
CKMB, poc: 2 ng/mL (ref 1.0–8.0)
Myoglobin, poc: 143 ng/mL (ref 12–200)
Troponin i, poc: <0.05 ng/mL (ref 0.00–0.09)

## 2010-11-18 LAB — CROSSMATCH: ABO/RH(D): O POS

## 2010-11-18 LAB — DIFFERENTIAL
Basophils Absolute: 0.1 10*3/uL (ref 0.0–0.1)
Basophils Relative: 1 % (ref 0–1)
Lymphocytes Relative: 42 % (ref 12–46)
Monocytes Relative: 8 % (ref 3–12)
Neutro Abs: 4.9 10*3/uL (ref 1.7–7.7)
Neutrophils Relative %: 42 % — ABNORMAL LOW (ref 43–77)

## 2010-11-18 LAB — POCT CARDIAC MARKERS: Troponin i, poc: <0.05 ng/mL (ref 0.00–0.09)

## 2010-11-18 LAB — POCT I-STAT, CHEM 8
BUN: 16 mg/dL (ref 6–23)
Calcium, Ion: 1.01 mmol/L — ABNORMAL LOW (ref 1.12–1.32)
Chloride: 106 mEq/L (ref 96–112)
Creatinine, Ser: 1.3 mg/dL (ref 0.4–1.5)
Glucose, Bld: 127 mg/dL — ABNORMAL HIGH (ref 70–99)

## 2010-11-18 LAB — APTT: aPTT: 38 seconds — ABNORMAL HIGH (ref 24–37)

## 2010-11-18 LAB — CBC
Hemoglobin: 16.2 g/dL (ref 13.0–17.0)
MCHC: 33.7 g/dL (ref 30.0–36.0)
RBC: 5.23 MIL/uL (ref 4.22–5.81)

## 2010-11-18 LAB — PROTIME-INR: INR: 1.3 (ref 0.00–1.49)

## 2010-11-20 ENCOUNTER — Encounter: Payer: Self-pay | Admitting: Internal Medicine

## 2010-12-28 ENCOUNTER — Other Ambulatory Visit: Payer: Self-pay | Admitting: Internal Medicine

## 2010-12-29 NOTE — Op Note (Signed)
[place]. [facility]  Patient:    CAINAN, TRULL                   MRN: [id number] Proc. Date: 06/12/00 Adm. Date:  [id number] Attending:  Ronne Binning CC:         Angelia Mould. Derrell Lolling, M.D.   Operative Report  PREOPERATIVE DIAGNOSIS:  Mass, left axilla.  POSTOPERATIVE DIAGNOSIS:  Mass, left axilla.  PROCEDURE:  Excision mass, left axilla.  SURGEON:  Nicki Reaper, M.D.  Mammie LorenzoMikey Bussing M. Derrell Lolling, M.D.  ANESTHESIOLOGIST:  Maren Beach, M.D.  CLINICAL NOTE:  The patient is a 70 year old male with a history of a mass in his left axilla.  He has a positive Tinels.  MRI revealed a probable lymph node.  DESCRIPTION OF PROCEDURE:  The patient was brought to the operating room, where general anesthesia was carried out without difficulty, and he was prepped and draped using Betadine scrub and solution.  A transverse incision was made directly over the mass, carried down through subcutaneous tissue. Bleeders were electrocauterized, and dissection was carried down to what appeared to be the lateral interbrachial cutaneous nerve of the forearm, where a mass was encountered within the nerve.  This had a mucinous, degenerative-type structure to it.  With blunt and sharp dissection it was dissected free, preserving fascicles.  The largest component of the nerve was posterior to the mass.  This was dissected entirely and sent to pathology. The wound was copiously irrigated with saline and the subcutaneous tissue was closed with interrupted 4-0 Vicryl and the skin with interrupted 4-0 nylon.  A sterile compressive dressing was applied, and the patient tolerated the procedure well and was taken to the recovery room for observation in satisfactory condition.  He is discharged home to return to [facility] in one week, on [place]. DD:  06/12/00 TD:  06/13/00 Job: [id number]

## 2011-03-28 ENCOUNTER — Other Ambulatory Visit: Payer: Self-pay | Admitting: Family Medicine

## 2011-03-28 ENCOUNTER — Other Ambulatory Visit: Payer: Self-pay | Admitting: Internal Medicine

## 2011-04-02 ENCOUNTER — Ambulatory Visit (INDEPENDENT_AMBULATORY_CARE_PROVIDER_SITE_OTHER): Payer: Medicare Other | Admitting: Cardiovascular Disease

## 2011-04-02 ENCOUNTER — Encounter: Payer: Self-pay | Admitting: Cardiovascular Disease

## 2011-04-02 VITALS — BP 144/80 | HR 70 | Ht 72.0 in | Wt 164.0 lb

## 2011-04-02 DIAGNOSIS — I2510 Atherosclerotic heart disease of native coronary artery without angina pectoris: Secondary | ICD-10-CM

## 2011-04-02 DIAGNOSIS — I4949 Other premature depolarization: Secondary | ICD-10-CM

## 2011-04-02 DIAGNOSIS — I7100 Dissection of unspecified site of aorta: Secondary | ICD-10-CM

## 2011-04-02 DIAGNOSIS — I10 Essential (primary) hypertension: Secondary | ICD-10-CM

## 2011-04-02 NOTE — Assessment & Plan Note (Addendum)
Long discussion about his previous dissection. Currently stable. Renal ultrasound earlier in the year showing perfusion to the left renal artery off the false lumen. Previous notes indicate dissection to the femoral arteries. We'll continue aggressive blood pressure management.

## 2011-04-02 NOTE — Progress Notes (Signed)
Patient ID: Ray Fowler, male    DOB: [birth year], 70 y.o.   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 CT showed extension of the dissection from the aortic root all the way down to the femoral arteries. Underwent aortic root replacement of hemiarch repair and aortic valve resuspension. Post-op course complicated by persistent HTN and atrial flutter treated with amio (stopped in January 2011).  He presents for routine followup and for symptoms of orthostasis.   ECHO showed EF 45-50%. Had cardiac CT in 12/10 after abnormal myvoiew. Coronaries normal except for 25% RCA. 6mm stable pulmonary nodule.     Last echo (02/2010) EF 55-60% with grade 1 diastolic dysfx and previous AO dissection flap.   He reports that in general he is doing well, is active. He does have symptoms from orthostasis which requires him to stand very slowly on a regular basis. The symptoms started when his amlodipine was increased from 5 mg daily to b.i.d. He does not drink very much at baseline. He does exercise with no limitations. He travels frequently.   Renal ultrasound done earlier in the year showing true lumen feeding the left RA and false lumen to the RRA.  EKG shows normal sinus rhythm with 70 beats per minute, no significant ST or T wave changes, rare PVCs    Outpatient Encounter Prescriptions as of 04/02/2011  Medication Sig Dispense Refill  . amLODipine (NORVASC) 5 MG tablet 5 mg. Take two tablets daily.      Marland Kitchen aspirin 81 MG tablet 81 mg. Take one tablet every other day.      Marland Kitchen atorvastatin (LIPITOR) 10 MG tablet Take 10 mg by mouth at bedtime.        . carvedilol (COREG) 25 MG tablet TAKE ONE TABLET BY MOUTH TWICE DAILY  60 tablet  4  . dextromethorphan-guaiFENesin (MUCINEX DM) 30-600 MG per 12 hr tablet Take 1 tablet by mouth as needed.        . fenofibrate (TRICOR) 145 MG tablet         . fluticasone (FLONASE) 50 MCG/ACT nasal spray Place 2 sprays into the nose daily as needed.        . fluticasone (FLOVENT HFA) 110 MCG/ACT inhaler Inhale 1 puff into the lungs as needed.        Marland Kitchen lisinopril (PRINIVIL,ZESTRIL) 20 MG tablet TAKE ONE TABLET BY MOUTH TWICE DAILY  60 tablet  8     Review of Systems  Constitutional: Negative.   HENT: Negative.   Eyes: Negative.   Respiratory: Negative.   Cardiovascular: Negative.        Dizzy with standing.  Gastrointestinal: Negative.   Musculoskeletal: Negative.   Skin: Negative.   Neurological: Negative.   Hematological: Negative.   Psychiatric/Behavioral: Negative.   All other systems reviewed and are negative.    BP 144/80  Pulse 70  Ht 6' (1.829 m)  Wt 164 lb (74.39 kg)  BMI 22.24 kg/m2  Physical Exam  Nursing note and vitals reviewed. Constitutional: He is oriented to person, place, and time. He appears well-developed and well-nourished.  HENT:  Head: Normocephalic.  Nose: Nose normal.  Mouth/Throat: Oropharynx is clear and moist.  Eyes: Conjunctivae are normal. Pupils are equal, round, and reactive to light.  Neck: Normal range of motion. Neck supple. No JVD present.  Cardiovascular: Normal rate, regular rhythm, S1 normal, S2 normal,  normal heart sounds and intact distal pulses.  Exam reveals no gallop and no friction rub.   No murmur heard. Pulmonary/Chest: Effort normal and breath sounds normal. No respiratory distress. He has no wheezes. He has no rales. He exhibits no tenderness.  Abdominal: Soft. Bowel sounds are normal. He exhibits no distension. There is no tenderness.  Musculoskeletal: Normal range of motion. He exhibits no edema and no tenderness.       Well-healed sternotomy scar  Lymphadenopathy:    He has no cervical adenopathy.  Neurological: He is alert and oriented to person, place, and time. Coordination normal.  Skin: Skin is warm and dry. No rash noted. No erythema.  Psychiatric: He has a  normal mood and affect. His behavior is normal. Judgment and thought content normal.           Assessment and Plan

## 2011-04-02 NOTE — Assessment & Plan Note (Signed)
Occasional palpitations noted. Continue beta blocker.

## 2011-04-02 NOTE — Assessment & Plan Note (Signed)
He reports orthostasis, most notable when he stands. We have suggested he stay hydrated, liberalize his salt intake, possibly decrease the amlodipine to 5 mg at lunch, 2.5 mg in the evening or if he continues to have symptoms, 5 mg daily.

## 2011-04-02 NOTE — Patient Instructions (Signed)
You are doing well. Please cut back on the amlodipine to 1/2 tab at lunch and a full pill at dinner. Stay hydrated. Increase salt intake.  Please call us if you have new issues that need to be addressed before your next appt.  We will call you for a follow up Appt. In 6 months

## 2011-04-02 NOTE — Assessment & Plan Note (Signed)
Currently with no symptoms of angina. No further workup at this time. Continue current medication regimen. 

## 2011-05-15 ENCOUNTER — Encounter: Payer: Self-pay | Admitting: Gastroenterology

## 2011-05-30 ENCOUNTER — Other Ambulatory Visit: Payer: Self-pay | Admitting: Internal Medicine

## 2011-06-18 ENCOUNTER — Other Ambulatory Visit: Payer: Self-pay | Admitting: Internal Medicine

## 2011-06-26 ENCOUNTER — Encounter: Payer: Self-pay | Admitting: Gastroenterology

## 2011-07-04 ENCOUNTER — Other Ambulatory Visit: Payer: Self-pay | Admitting: Family Medicine

## 2011-07-04 DIAGNOSIS — I10 Essential (primary) hypertension: Secondary | ICD-10-CM

## 2011-07-04 DIAGNOSIS — C61 Malignant neoplasm of prostate: Secondary | ICD-10-CM

## 2011-07-09 ENCOUNTER — Other Ambulatory Visit (INDEPENDENT_AMBULATORY_CARE_PROVIDER_SITE_OTHER): Payer: Medicare Other

## 2011-07-09 DIAGNOSIS — I10 Essential (primary) hypertension: Secondary | ICD-10-CM

## 2011-07-09 DIAGNOSIS — C61 Malignant neoplasm of prostate: Secondary | ICD-10-CM

## 2011-07-09 LAB — LIPID PANEL
Cholesterol: 134 mg/dL (ref 0–200)
HDL: 38 mg/dL — ABNORMAL LOW (ref >39.00)
Triglycerides: 46 mg/dL (ref 0.0–149.0)
VLDL: 9.2 mg/dL (ref 0.0–40.0)

## 2011-07-09 LAB — COMPREHENSIVE METABOLIC PANEL
Albumin: 4.4 g/dL (ref 3.5–5.2)
Alkaline Phosphatase: 45 U/L (ref 39–117)
BUN: 18 mg/dL (ref 6–23)
Calcium: 9.2 mg/dL (ref 8.4–10.5)
Creatinine, Ser: 1 mg/dL (ref 0.4–1.5)
GFR: 79.61 mL/min (ref >60.00)
Glucose, Bld: 100 mg/dL — ABNORMAL HIGH (ref 70–99)
Potassium: 4.1 mEq/L (ref 3.5–5.1)

## 2011-07-10 ENCOUNTER — Encounter: Payer: Self-pay | Admitting: Family Medicine

## 2011-07-11 ENCOUNTER — Encounter: Payer: Self-pay | Admitting: Family Medicine

## 2011-07-11 ENCOUNTER — Ambulatory Visit (INDEPENDENT_AMBULATORY_CARE_PROVIDER_SITE_OTHER): Payer: Medicare Other | Admitting: Family Medicine

## 2011-07-11 VITALS — BP 148/84 | HR 76 | Temp 98.5°F | Ht 72.0 in | Wt 170.0 lb

## 2011-07-11 DIAGNOSIS — I7100 Dissection of unspecified site of aorta: Secondary | ICD-10-CM

## 2011-07-11 DIAGNOSIS — I10 Essential (primary) hypertension: Secondary | ICD-10-CM

## 2011-07-11 DIAGNOSIS — R0989 Other specified symptoms and signs involving the circulatory and respiratory systems: Secondary | ICD-10-CM

## 2011-07-11 DIAGNOSIS — E781 Pure hyperglyceridemia: Secondary | ICD-10-CM

## 2011-07-11 DIAGNOSIS — Z23 Encounter for immunization: Secondary | ICD-10-CM

## 2011-07-11 NOTE — Assessment & Plan Note (Signed)
Not heard by me today. Cont with Cards.

## 2011-07-11 NOTE — Assessment & Plan Note (Signed)
See dissection note. A little high today. Balance lightheadedness with BP control. I think he will get used to/tolerant to lightheadedness.

## 2011-07-11 NOTE — Progress Notes (Signed)
  Subjective:    Patient ID: Ray Fowler, male    DOB: 11/07/1940, 70 y.o.   MRN: [id number]  HPI Pt here for Comp Exam, last year done by Dr Para March. He was recently seen by Cards and told to keep BP under good control to decrease risk of anuerysm worsening.  He has been monitoring his pressure reasonably carefully. He has nocturia occasionally now. He occas looses control.     Review of Systems  Constitutional: Negative for fever, chills, diaphoresis, appetite change, fatigue and unexpected weight change.  HENT: Negative for hearing loss, ear pain, tinnitus and ear discharge.   Eyes: Negative for pain, discharge and visual disturbance.       Recent exam with early macular degeneration.  Respiratory: Negative for cough, shortness of breath and wheezing.        Alllergic sxs when pollen out. Nasal inhaler works well.  Cardiovascular: Negative for chest pain and palpitations.       No SOB w/ exertion  Gastrointestinal: Negative for nausea, vomiting, abdominal pain, diarrhea, constipation and blood in stool.       No heartburn or swallowing problems. Has mild lactose intolerance.  Genitourinary: Negative for dysuria, frequency and difficulty urinating.       Occas nocturia  Musculoskeletal: Negative for myalgias, back pain and arthralgias.  Skin: Negative for rash.       No itching or dryness.  Neurological: Negative for tremors and numbness.       No tingling or balance problems.  Hematological: Negative for adenopathy. Does not bruise/bleed easily.       Objective:   Physical Exam  Constitutional: He is oriented to person, place, and time. He appears well-developed and well-nourished. No distress.  HENT:  Head: Normocephalic and atraumatic.  Right Ear: External ear normal.  Left Ear: External ear normal.  Nose: Nose normal.  Mouth/Throat: Oropharynx is clear and moist.  Eyes: Conjunctivae and EOM are normal. Pupils are equal, round, and reactive to light. Right eye  exhibits no discharge. Left eye exhibits no discharge. No scleral icterus.  Neck: Normal range of motion. Neck supple. No thyromegaly present.  Cardiovascular: Normal rate, regular rhythm, normal heart sounds and intact distal pulses.   No murmur heard. Pulmonary/Chest: Effort normal and breath sounds normal. No respiratory distress. He has no wheezes.  Abdominal: Soft. Bowel sounds are normal. He exhibits no distension and no mass. There is no tenderness. There is no rebound and no guarding.  Genitourinary: Penis normal.       No rectal exam done.  Musculoskeletal: Normal range of motion. He exhibits no edema.  Lymphadenopathy:    He has no cervical adenopathy.  Neurological: He is alert and oriented to person, place, and time. Coordination normal.  Skin: Skin is warm and dry. No rash noted. He is not diaphoretic.  Psychiatric: He has a normal mood and affect. His behavior is normal. Judgment and thought content normal.          Assessment & Plan:  HMPE   I have personally reviewed the Medicare Annual Wellness questionnaire and have noted 1. The patient's medical and social history 2. Their use of alcohol, tobacco or illicit drugs 3. Their current medications and supplements 4. The patient's functional ability including ADL's, fall risks, home safety risks and hearing or visual             impairment. 5. Diet and physical activities 6. Evidence for depression or mood disorders

## 2011-07-11 NOTE — Assessment & Plan Note (Signed)
Discussed at length. Feel risk of pressure up vs risk of worsening dissection should be balanced toward the latter and to that end, would work slowly toward getting to 5mg  Amlodipine bid regularly. Cont other meds as well. BP Readings from Last 3 Encounters:  07/11/11 148/84  04/02/11 144/80  10/17/10 136/82

## 2011-07-11 NOTE — Patient Instructions (Signed)
Call for appt with Dr Para March when needed or this time next year for PE. Would cont occas PSA to follow cancer.

## 2011-07-11 NOTE — Assessment & Plan Note (Signed)
Good control. HDL slightly low. Exercise is all I would suggest.

## 2011-07-17 ENCOUNTER — Telehealth: Payer: Self-pay | Admitting: *Deleted

## 2011-07-17 ENCOUNTER — Encounter: Payer: Self-pay | Admitting: Gastroenterology

## 2011-07-17 ENCOUNTER — Ambulatory Visit (AMBULATORY_SURGERY_CENTER): Payer: Medicare Other | Admitting: *Deleted

## 2011-07-17 DIAGNOSIS — Z86010 Personal history of colonic polyps: Secondary | ICD-10-CM

## 2011-07-17 DIAGNOSIS — Z1211 Encounter for screening for malignant neoplasm of colon: Secondary | ICD-10-CM

## 2011-07-17 MED ORDER — METOCLOPRAMIDE HCL 10 MG PO TABS: 10.0000 mg | ORAL_TABLET | Freq: Once | ORAL | Status: DC

## 2011-07-17 MED ORDER — PEG-KCL-NACL-NASULF-NA ASC-C 100 G PO SOLR: 1.0000 | Freq: Once | ORAL | Status: DC

## 2011-07-17 NOTE — Telephone Encounter (Signed)
Reglan 10 mg 30 min before each MoviPrep dose

## 2011-07-17 NOTE — Telephone Encounter (Signed)
Attempted to reach pt with Dr. [name] Isaacs order.  LMOM on home machine with pt's instructions.  Told to call office back if he has any questions.  Reglan rx sent to [facility] on [address] in [place]

## 2011-07-17 NOTE — Telephone Encounter (Signed)
Dr. Russella Dar, This pt was here for his is previsit this a.m.  He states that he always has a lot of difficulty drinking his prep.  He has nausea and always vomits when drinking the prep.  Could he be given something to help with this?  Thank you, Baxter Hire

## 2011-07-17 NOTE — Progress Notes (Signed)
Pt states he "always has nausea after any kind of anesthesia." Writer told him to tell his room nurse this the day of the procedure.

## 2011-07-23 ENCOUNTER — Ambulatory Visit (INDEPENDENT_AMBULATORY_CARE_PROVIDER_SITE_OTHER): Payer: Medicare Other | Admitting: Family Medicine

## 2011-07-23 ENCOUNTER — Telehealth: Payer: Self-pay | Admitting: Internal Medicine

## 2011-07-23 ENCOUNTER — Telehealth: Payer: Self-pay | Admitting: Cardiovascular Disease

## 2011-07-23 ENCOUNTER — Encounter: Payer: Self-pay | Admitting: Family Medicine

## 2011-07-23 VITALS — BP 144/84 | HR 83 | Temp 98.5°F | Wt 172.0 lb

## 2011-07-23 DIAGNOSIS — J069 Acute upper respiratory infection, unspecified: Secondary | ICD-10-CM

## 2011-07-23 NOTE — Progress Notes (Signed)
duration of symptoms: since the 28th of November Rhinorrhea:yes Congestion:yes, but "I'm not as nasal as I was before." ear pain:no sore throat:yes Cough:minimal myalgias:no other concerns: post nasal gtt at night is bothersome for him Colonoscopy is planned for the 18th.    He's gradually improved but not back to baseline.    ROS: See HPI.  Otherwise negative.    Meds, vitals, and allergies reviewed.   GEN: nad, alert and oriented HEENT: mucous membranes moist, TM w/o erythema, nasal epithelium injected, OP with cobblestoning, R TM with poor movement on valsalva.   NECK: supple w/o LA CV: rrr. PULM: ctab, no inc wob, no focal dec in BS EXT: no edema

## 2011-07-23 NOTE — Telephone Encounter (Signed)
Scheduled an appt. For 2:30 today due to URI he has lingering chest congestion.

## 2011-07-23 NOTE — Telephone Encounter (Signed)
LMOM to call regarding procedure.

## 2011-07-23 NOTE — Patient Instructions (Signed)
I would sleep with your head elevated, try to get some rest and this should resolve.  Take care.

## 2011-07-23 NOTE — Telephone Encounter (Signed)
Patient is scheduled for colonoscopy on Dec. 18th.  Wants to know if he needs to stop any of his medications prior to the procedure like the aspirin 81 mg.  Also wanted to know if he needed to be on an antibiotic prior to the procedure as he has had to take this in the past when having dental procedures? If patient is not at home please leave detailed message on answering machine.

## 2011-07-24 ENCOUNTER — Encounter: Payer: Self-pay | Admitting: Family Medicine

## 2011-07-24 DIAGNOSIS — J069 Acute upper respiratory infection, unspecified: Secondary | ICD-10-CM | POA: Insufficient documentation

## 2011-07-24 NOTE — Assessment & Plan Note (Addendum)
Resolving, nontoxic, supportive tx and no other intervention needed.  Reassured, f/u prn.

## 2011-07-26 ENCOUNTER — Telehealth: Payer: Self-pay | Admitting: *Deleted

## 2011-07-26 ENCOUNTER — Telehealth: Payer: Self-pay | Admitting: Internal Medicine

## 2011-07-26 MED ORDER — OSELTAMIVIR PHOSPHATE 75 MG PO CAPS: 75.0000 mg | ORAL_CAPSULE | Freq: Two times a day (BID) | ORAL | Status: DC

## 2011-07-26 NOTE — Telephone Encounter (Signed)
Called pt, he is sick with fever and he has postponed colonoscopy for 1/31. Pt was notified per Dr. Mariah Milling that he will not need to hold ASA 81 and he does not need ABX prior to colonoscopy.

## 2011-07-26 NOTE — Telephone Encounter (Signed)
I called pt.  He has delayed the colonoscopy.  Temp up to 101 last night.  He's taking aleve and tylenol.  Minimal aches.  His chest feels tight when outside in cold air, better inside with warm air.  We talked about options.  He hadn't gotten the flu shot a longtime before his sx started.  He doesn't have sig cough.  Start tamiflu and f/u prn.  He doesn't sound ill enough to need OV at this point. He'll f/u or call back prn.

## 2011-07-26 NOTE — Telephone Encounter (Signed)
Patient was seen on Monday the 10th and last night he begun to have chills and a fever 100.0 and wants to know if something could be called in for this.

## 2011-07-26 NOTE — Telephone Encounter (Signed)
Disregard previous message, notified pt with valve hx, will send Amoxicillin 2 GM 2hrs prior to colonoscopy, and he will need to also take prior to any dental appts also per Dr. Mariah Milling. Pt's colo has been r/s so he will call the office when he needs Rx sent. Pt knows to continue ASA 81mg  for colo.

## 2011-07-30 ENCOUNTER — Emergency Department: Payer: Self-pay | Admitting: Emergency Medicine

## 2011-07-30 ENCOUNTER — Encounter: Payer: Self-pay | Admitting: Family Medicine

## 2011-07-30 ENCOUNTER — Telehealth: Payer: Self-pay | Admitting: Family Medicine

## 2011-07-30 ENCOUNTER — Ambulatory Visit (INDEPENDENT_AMBULATORY_CARE_PROVIDER_SITE_OTHER): Payer: Medicare Other | Admitting: Family Medicine

## 2011-07-30 VITALS — BP 130/60 | HR 93 | Temp 99.5°F | Wt 172.0 lb

## 2011-07-30 DIAGNOSIS — R059 Cough, unspecified: Secondary | ICD-10-CM

## 2011-07-30 DIAGNOSIS — J189 Pneumonia, unspecified organism: Secondary | ICD-10-CM | POA: Insufficient documentation

## 2011-07-30 DIAGNOSIS — R05 Cough: Secondary | ICD-10-CM

## 2011-07-30 NOTE — Assessment & Plan Note (Addendum)
With fever and hypoxia.  This is a new finding.  D/w pt and encouraged EMS tx to ER for eval and possible admission.  He agrees.  Pt's condition has worsened; prev with sats of 97% on RA at last eval and clear lungs . EMS called and in route. >25 min spent with face to face with patient, >50% counseling and/or coordinating care.

## 2011-07-30 NOTE — Telephone Encounter (Signed)
Patient Name: Latron Ribas Call Date & Time: 07/28/2011 5:59:34PM Patient Phone: [phone] PCP: Patient Gender: Male PCP Fax : Patient DOB: 04/19/1941 Practice Name: [facility] Reason for Call: Caller: Zaydin/Patient; PCP: Crawford Givens Clelia Croft); CB#: [phone]; Call regarding No better. Saw Dr. Ninfa Linden 07/25/11 for cold SX and got Tamiflu and has taken x 3 days. He still have cough and last temp was 100.7 @ 1500. Having chills 07/26/11. Feels SOB with exertion. Traiged Flu-like SX and voiding with each cough. He is SOB even at rest. Called Dr. Neena Rhymes and inst to send to the E/R. Protocol(s) Used: Flu-Like Symptoms Recommended Outcome per Protocol: Call Provider within 4 Hours Reason for Outcome: Flu-like symptoms associated with a cough and breathing that is becoming more difficult Care Advice: ~

## 2011-07-30 NOTE — Progress Notes (Signed)
He was initially getting better at last OV and he was thought to have a resolving (likely viral illness).  We talked about his condition 07/26/11 but he didn't sound toxic and we presumptively treated him with tamiflu. He came in today with 90-91% sats.  Still with fevers, worse in afternoon.   He has DOE.  All sx worse in the last few days.  No BLE edema.  He can get a deep breath but then starts coughing.    There was a phone note from Saturday.  He declined UC over the weekend.  He came in today for eval.    Meds, vitals, and allergies reviewed.   ROS: See HPI.  Otherwise, noncontributory.  nad Mmm rrr Faint exp crackles noted No inc in wob abd benign No edema o2 sat 90-91%, improved to 94-95% with 3L O2

## 2011-07-30 NOTE — Telephone Encounter (Signed)
Appt scheduled this afternoon.

## 2011-07-31 ENCOUNTER — Other Ambulatory Visit: Payer: Medicare Other | Admitting: Gastroenterology

## 2011-08-03 ENCOUNTER — Ambulatory Visit (INDEPENDENT_AMBULATORY_CARE_PROVIDER_SITE_OTHER): Payer: Medicare Other | Admitting: Family Medicine

## 2011-08-03 ENCOUNTER — Encounter: Payer: Self-pay | Admitting: Family Medicine

## 2011-08-03 VITALS — BP 138/78 | HR 96 | Temp 98.8°F | Ht 72.0 in | Wt 167.0 lb

## 2011-08-03 DIAGNOSIS — J189 Pneumonia, unspecified organism: Secondary | ICD-10-CM

## 2011-08-03 NOTE — Patient Instructions (Signed)
Come back for a chest xray in 6 weeks.  You can schedule this with Terri or call back to set it up.   We'll let you know about report.   Take care.

## 2011-08-03 NOTE — Progress Notes (Signed)
ER f/u.  Records reviewed.  Breathing is better, not sob, pulse ox is wnl.  No fevers now.  In midst of 7 days of avelox.  Some foot edema, not leg edema.  Edema better in the AM, better today than yesterday.  We had a long discussion about the prev PNA vaccine- he's up to date- and the rationale to send him to ER prev.  He isn't hypoxic now and he feels better.   Meds, vitals, and allergies reviewed.   ROS: See HPI.  Otherwise, noncontributory.  nad ncat Tm wnl  mmm rrr RUL ronchi, but o/w ctab w/o wheeze or inc in wob abd soft, not ttp Ext with trace edema

## 2011-08-05 ENCOUNTER — Encounter: Payer: Self-pay | Admitting: Family Medicine

## 2011-08-05 ENCOUNTER — Other Ambulatory Visit: Payer: Self-pay | Admitting: Family Medicine

## 2011-08-05 NOTE — Assessment & Plan Note (Signed)
Improved, not sob, not hypoxic, in midst of avelox tx. D/w pt about signs that would need eval- cough not improving, fever, sob.  D/w pt about getting cxr in about 6 weeks.  He understood all.  >25 min spent with face to face with patient, >50% counseling and/or coordinating care.

## 2011-08-14 HISTORY — PX: CARDIAC SURGERY: SHX584

## 2011-09-06 ENCOUNTER — Telehealth: Payer: Self-pay | Admitting: *Deleted

## 2011-09-06 MED ORDER — AMOXICILLIN 500 MG PO CAPS: ORAL_CAPSULE | ORAL | Status: DC

## 2011-09-06 NOTE — Telephone Encounter (Signed)
See previous phone msg, pt has now r/s colonoscopy for 09/13/11, and will need Amoxicillin Rx sent to [facility]. Notified pt I will send Rx in and notified to continue ASA 81. Pt verbalized understanding.

## 2011-09-10 ENCOUNTER — Encounter: Payer: Self-pay | Admitting: Family Medicine

## 2011-09-10 ENCOUNTER — Ambulatory Visit (INDEPENDENT_AMBULATORY_CARE_PROVIDER_SITE_OTHER)
Admission: RE | Admit: 2011-09-10 | Discharge: 2011-09-10 | Disposition: A | Discharge status: Home / Self Care | Payer: Medicare Other | Source: Ambulatory Visit | Attending: Family Medicine | Admitting: Family Medicine

## 2011-09-10 ENCOUNTER — Ambulatory Visit (INDEPENDENT_AMBULATORY_CARE_PROVIDER_SITE_OTHER): Payer: Medicare Other | Admitting: Family Medicine

## 2011-09-10 VITALS — BP 132/80 | HR 84 | Temp 98.4°F | Wt 169.8 lb

## 2011-09-10 DIAGNOSIS — J189 Pneumonia, unspecified organism: Secondary | ICD-10-CM

## 2011-09-10 DIAGNOSIS — J029 Acute pharyngitis, unspecified: Secondary | ICD-10-CM

## 2011-09-10 DIAGNOSIS — I7100 Dissection of unspecified site of aorta: Secondary | ICD-10-CM

## 2011-09-10 NOTE — Patient Instructions (Signed)
We'll contact you with your xray report. Call the GI clinic and tell them you have a cold.   Keep taking your cold meds.

## 2011-09-10 NOTE — Progress Notes (Signed)
PNA resolved from prev.  He was back to baseline.  Now with scratchy throat since Friday. Mild ST.  Temp max ~98.  He usually runs about 97, ~1 degree low usually.  Slept well last night.  He took some mucinex.  No sig rhinorrhea.  Some facial pressure.  Less cough than with prev illness.  Some postnasal gtt. Not SOB.  He had to use some afrin last night and slept better.  His appetite is still good.  He doesn't feel like he did with the PNA.    Colonoscopy planned for later this week.  "If it wasn't for the colonoscopy, I wouldn't be here today."  GEN: nad, alert and oriented HEENT: mucous membranes moist, tm w/o erythema, nasal exam w/o erythema, clear discharge noted,  OP with cobblestoning NECK: supple w/o LA CV: rrr.   PULM: ctab, no inc wob EXT: no edema SKIN: no acute rash

## 2011-09-11 ENCOUNTER — Telehealth: Payer: Self-pay | Admitting: Family Medicine

## 2011-09-11 ENCOUNTER — Other Ambulatory Visit: Payer: Self-pay | Admitting: Family Medicine

## 2011-09-11 ENCOUNTER — Telehealth: Payer: Self-pay | Admitting: Gastroenterology

## 2011-09-11 ENCOUNTER — Telehealth: Payer: Self-pay | Admitting: Cardiovascular Disease

## 2011-09-11 ENCOUNTER — Ambulatory Visit
Admission: RE | Admit: 2011-09-11 | Discharge: 2011-09-11 | Disposition: A | Discharge status: Home / Self Care | Payer: Medicare Other | Source: Ambulatory Visit | Attending: Family Medicine | Admitting: Family Medicine

## 2011-09-11 ENCOUNTER — Encounter: Payer: Self-pay | Admitting: Family Medicine

## 2011-09-11 DIAGNOSIS — I7100 Dissection of unspecified site of aorta: Secondary | ICD-10-CM

## 2011-09-11 MED ORDER — IOHEXOL 300 MG/ML  SOLN
125.0000 mL | Freq: Once | INTRAMUSCULAR | Status: AC | PRN
  Administered 2011-09-11: 125 mL via INTRAVENOUS

## 2011-09-11 NOTE — Telephone Encounter (Signed)
Pt called stating that he had a chest xray yesterday and wants Dr Mariah Milling to take a look at it and let him know what he should do. Dr Para March wants to send him for a CT scan.

## 2011-09-11 NOTE — Assessment & Plan Note (Signed)
Resolved on CXR but now with widened mediastinum.  Called pt, will check CTA.  If any CP, then to ER.  No CP when I called pt this AM.  I think he likely has a mild viral illness for the sx listed above.  No indication for abx.  Nontoxic on exam.

## 2011-09-11 NOTE — Telephone Encounter (Signed)
No charge. 

## 2011-09-11 NOTE — Telephone Encounter (Signed)
CT reviewed. Would agree with surgical consultation with vascular surgery ASAP, in [place]. Call our office if he does not get appt soon via Dr. Para March

## 2011-09-11 NOTE — Telephone Encounter (Signed)
App help of all involved.   

## 2011-09-11 NOTE — Telephone Encounter (Signed)
Called [facility] spoke with [facility], she initially set up patient an appt with Dr Alla German for 09/12/2011 at 4pm. Dawn called [place] back to say that Dr Laneta Simmers wanted to see the patient and rescheduled his appt to Monday, 09/17/2011 at 2:30pm, Alvis Lemmings will call the patient with change in appt day and time. I also have faxed a request to [facility] to obtain the patients previous records from his surgery at [facility] in 2010. Will fax them directly to [facility] at [facility] to give to Dr Laneta Simmers.

## 2011-09-11 NOTE — Telephone Encounter (Signed)
Call report taken, I called Dr. Laneta Simmers with [facility].  Prev surgery done at [facility].  I called patient.  Will ask for office eval this week with [facility].  If any chest pain, pt to go to ER.  He understood.

## 2011-09-11 NOTE — Telephone Encounter (Signed)
Pt had CXR at [facility]. I spoke to him this AM, he says he had CT scan scheduled at 10 AM today. Will notified Dr. Mariah Milling.

## 2011-09-11 NOTE — Assessment & Plan Note (Signed)
See above.  Refer for CTA. >25 min spent with face to face with patient, >50% counseling and/or coordinating care

## 2011-09-12 ENCOUNTER — Encounter: Payer: Medicare Other | Admitting: Cardiothoracic Surgery

## 2011-09-12 ENCOUNTER — Encounter: Payer: Self-pay | Admitting: Gastroenterology

## 2011-09-12 ENCOUNTER — Other Ambulatory Visit: Payer: Self-pay | Admitting: Family Medicine

## 2011-09-12 DIAGNOSIS — R918 Other nonspecific abnormal finding of lung field: Secondary | ICD-10-CM | POA: Insufficient documentation

## 2011-09-12 NOTE — Telephone Encounter (Signed)
Spoke to pt, notified of info below. He states he has appt with Dr. Evelene Croon on Mon 09/17/11. Pt is asking when this dx was determined and what was the CT compared to. I told pt I can look at notes and try to find out, otherwise could discuss at vascular visit. Pt asked that I send this information to Dr. Gala Romney as well.

## 2011-09-12 NOTE — Telephone Encounter (Signed)
Error

## 2011-09-13 ENCOUNTER — Other Ambulatory Visit: Payer: Medicare Other | Admitting: Gastroenterology

## 2011-09-13 NOTE — Telephone Encounter (Signed)
That is very prominent. Needs to be seen asap. thanks

## 2011-09-13 NOTE — Telephone Encounter (Signed)
We spoke with [facility]. They reviewed case. Ok to wait until monday

## 2011-09-13 NOTE — Telephone Encounter (Signed)
Noted. Spoke to [facility], per Dr. Laneta Simmers, wanted pt seen Monday 2/4, office notified if any symptoms occur to go to ER. Is Monday ok or does he need sooner appt? Thanks.

## 2011-09-14 NOTE — Telephone Encounter (Signed)
Ok, noted. Thanks!

## 2011-09-16 ENCOUNTER — Encounter: Payer: Self-pay | Admitting: Family Medicine

## 2011-09-17 ENCOUNTER — Encounter: Payer: Self-pay | Admitting: Surgery

## 2011-09-17 ENCOUNTER — Institutional Professional Consult (permissible substitution) (INDEPENDENT_AMBULATORY_CARE_PROVIDER_SITE_OTHER): Payer: Medicare Other | Admitting: Surgery

## 2011-09-17 ENCOUNTER — Encounter: Payer: Self-pay | Admitting: *Deleted

## 2011-09-17 VITALS — BP 146/85 | HR 88 | Resp 20 | Ht 72.0 in | Wt 165.0 lb

## 2011-09-17 DIAGNOSIS — I7100 Dissection of unspecified site of aorta: Secondary | ICD-10-CM

## 2011-09-17 DIAGNOSIS — I7101 Dissection of thoracic aorta: Secondary | ICD-10-CM

## 2011-09-17 NOTE — Progress Notes (Signed)
[address]            [place] [address]          [id number]      PCP is Crawford Givens, MD, MD Referring Provider is Joaquim Nam, MD  Chief Complaint  Patient presents with  . Thoracic Aortic Dissection    Referral from Dr Para March, CTA Chest 09/11/11     HPI:  The patient is a 71 year old gentleman history of hypertension and hyperlipidemia who presented in August 2010 with an acute type A aortic dissection. The cardiac surgeon on call here was busy operating on an emergency on a Sunday and therefore the patient was transferred to [facility] and underwent surgical repair with resuspension of his aortic valve and replacement of the ascending aorta and hemiarch with a 24 mm hemashield graft by Dr. Nevada Crane. The patient apparently had some problems with postoperative coagulopathy but this resolved and he had an otherwise uncomplicated postoperative course. He was seen a few weeks later by Dr. Kittie Plater from cardiology for atrial flutter which was treated with medication and resolved. He said that he has not had any further followup CT scans until recently. He was seen by his primary physician and treated for possible pneumonia in November of 2012. He said that he recently had a followup chest x-ray to be sure the pneumonia had resolved and this showed widening of the mediastinum. 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 There was not felt to be any evidence of leaking or rupture and the patient has had no symptoms of this. He was referred to my office for evaluation.  Past Medical History  Diagnosis Date  . Prostate cancer   . Diverticulosis   . HTN (hypertension)   . Allergic rhinitis   . Aortic dissection     Type A  . Carotid bruit     Right. Carotid u/s 2/11 normal.  . Pulmonary nodule     Stable on CT 2/11  . Hyperlipidemia     Past Surgical History    Procedure Date  . Hemi arch replacement 03/2009    and AoV resuspension w/ Repl of Ascending Aorta and Hemiarch w/ Graft; Pericardial Tamponade Aortic Regurg 8/22-8/28/10;   . Prostatectomy 12/1993    Dr. Isabel Caprice  . Penile prosthesis implant   . Flex- polyp  06/1999    at 38cm, Int/Ext Hemm divertics Russella Dar)  . Axillary mass removal 05/2000    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[place])  . [facility] 08/22-8/28/2010    Thoracic aortic dissection pericardial teamponade aortic regurg  . Colonoscopy   . Polypectomy     Family History  Problem Relation Age of Onset  . Heart failure Mother     died age 36  . Hypertension Mother   . Heart disease Mother     CHF  . Kidney failure Father   . Heart disease Father     coronary disease, CABG  . Stroke Father     due to "infection" from [place]  . Kidney disease Father     chronic  . Diabetes Other   . Hyperlipidemia Daughter   . Diabetes Other   . Colon cancer Neg Hx   . Esophageal cancer Neg Hx   . Stomach cancer Neg Hx   . Rectal  cancer Neg Hx     Social History History  Substance Use Topics  . Smoking status: Former Smoker -- 1.0 packs/day for 25 years    Types: Cigarettes    Quit date: 07/10/1994  . Smokeless tobacco: Never Used   Comment: quit 1995  . Alcohol Use: 0.5 oz/week    1 drink(s) per week     occassionally wine    Current Outpatient Prescriptions  Medication Sig Dispense Refill  . amLODipine (NORVASC) 5 MG tablet 2.5 mg 2 (two) times daily.       Marland Kitchen aspirin 81 MG tablet 81 mg. Take one tablet every other day.      Marland Kitchen atorvastatin (LIPITOR) 10 MG tablet TAKE ONE TABLET BY MOUTH NIGHTLY AT BEDTIME  30 tablet  6  . carvedilol (COREG) 25 MG tablet TAKE ONE TABLET BY MOUTH TWICE DAILY  60 tablet  6  . dextromethorphan-guaiFENesin (MUCINEX DM) 30-600 MG per 12 hr tablet Take 1 tablet by mouth as needed.        . fenofibrate (TRICOR) 145 MG tablet Take one by mouth every other day      . fluticasone  (FLONASE) 50 MCG/ACT nasal spray Place 2 sprays into the nose daily as needed.        . fluticasone (FLOVENT HFA) 110 MCG/ACT inhaler Inhale 1 puff into the lungs as needed.        Marland Kitchen lisinopril (PRINIVIL,ZESTRIL) 20 MG tablet TAKE ONE TABLET BY MOUTH TWICE DAILY  60 tablet  8  . Multiple Vitamins-Minerals (ICAPS MV PO) Take 1 tablet by mouth 1 day or 1 dose.        No Known Allergies  Review of Systems:  General: He denies fever and chills. He said no recent weight changes. Appetite has been good. He denies fatigue. Eyes: Negative ENT: Negative Endocrine: He denies diabetes and hypothyroidism. Cardiovascular: He denies any chest pain or pressure. He denies exertional dyspnea. He has had no orthopnea or PND. He denies peripheral edema and palpitations. He does report some chronic discomfort between his shoulder blades which has been present ever since his initial presentation with aortic dissection. He thought this was probably related to the prior sternotomy. It is unchanged. Respiratory: He denies cough and sputum production. He has had no hemoptysis. GI: He denies nausea and vomiting. He has had no melena or bright red blood per rectum. He denies dysphagia. GU: He denies dysuria and hematuria. Neurological: He denies any focal weakness or numbness. He denies dizziness and syncope. He has never had a TIA or stroke. Vascular: He denies claudication and phlebitis. He has never had a DVT.  BP 146/85  Pulse 88  Resp 20  Ht 6' (1.829 m)  Wt 165 lb (74.844 kg)  BMI 22.38 kg/m2  SpO2 96% Physical Exam:  He is a well-developed, white male in no distress. HEENT: Normocephalic and atraumatic. Pupils are equal and reactive to light. Extraoccular muscles are intact. Oropharynx is clear. Neck: Carotid pulses are palpable bilaterally. There are no bruits. There is no adenopathy or thyromegaly. Heart: Regular rate and rhythm with normal S1 and S2. There is no murmur, rub, or gallop. Lungs:  Clear Chest: There is a well-healed sternotomy  scar. The sternum is stable. Abdomen: Bowel sounds are present. Soft and nontender. There are no palpable masses or organomegaly. Extremities: Warm and pink. Pedal pulses are palpable bilaterally. Radial pulses are equal bilaterally. There is no peripheral edema. Neurological: Alert and oriented x3. Motor and sensory exams  are grossly normal.  Diagnostic Tests:  *RADIOLOGY REPORT*   Clinical Data: Right mediastinum noted on chest x-ray today.  Prior aortic dissection repair in 2010.  Low grade fever.  Pneumonia 1 month ago.   CT ANGIOGRAPHY CHEST   Technique:  Multidetector CT imaging of the chest using the standard protocol during bolus administration of intravenous contrast. Multiplanar reconstructed images including MIPs were obtained and reviewed to evaluate the vascular anatomy.   Contrast: OMNIPAQUE IOHEXOL 300 MG/ML IV SOLN   Comparison: Cardiac CTA 07/18/2009.   Findings:   Mediastinum:  Noncontrast images demonstrate uniform size and slight high attenuation in the wall of the ascending aorta, indicative of prior graft repair of the ascending aorta.  Post contrast images demonstrate massive aneurysmal dilatation of the aortic arch, up to 7.3 cm in diameter proximally on image 40 of series 4.  There is also a dissection of the aortic arch, descending thoracic aorta and visualized portions of the abdominal aorta.  This dissection flap extends into the brachiocephalic artery to the level of the bifurcation into the subclavian and right common carotid arteries (without definite extension into either of those vessels, both vessels fed by true lumen). The dissection flap also comes in close proximity to the ostia of both the left common carotid and left subclavian vessels, without extension into either of these vessels; both the left common carotid and left subclavian arteries are fed by the true lumen. The distal  aortic arch is also aneurysmal measuring 4.5 cm in diameter (axial image 33 of series 4). Despite the size of this dissecting aneurysm, there is no surrounding high attenuation material within the mediastinum at this time to suggest active extravasation, and no evidence of mediastinal hematoma to suggest recent leakage. There is some acentric nonenhancing material (approximate 40-50 HU) in the proximal aspect of the false lumen, which likely represent mural thrombus (best demonstrated on image 51 of series 4).   Heart size is mildly enlarged.  No definite pericardial fluid, thickening or pericardial calcification.  Calcifications are present in the left anterior descending and right coronary arteries.  Esophagus is unremarkable in appearance.  Numerous reactive size mediastinal lymph nodes are present.   Lungs/Pleura: Mild diffuse bronchial wall thickening.  Mild centrilobular emphysema.  There are a few scattered tiny pulmonary nodules in the lungs bilaterally, the largest which measures 6 mm in the lateral segment of the right middle lobe (image 43 of series 5).  No consolidative airspace disease.  Dependent atelectasis in the left lower lobe.  Areas of more pronounced bronchial wall thickening with mild cylindrical bronchiectasis and peribronchovascular ground-glass attenuation in the inferior aspects of the lower lobes lungs bilaterally, as well as inferior segment lingula, likely reflective of chronic airway infection from indolent organism such as MAI. No pleural effusions.   Upper Abdomen: The aortic dissection extends into the abdomen, where the dissection flap itself extends into the origin of the superior mesenteric artery.  The celiac axis is fed by the true lumen.  The superior mesenteric artery appears fed by both the true and false lumens (although the distal aspect of the SMA appears predominately fed by the true lumen as the dissection flap terminates after an area  of aneurysmal dilatation that measures up to 1.7 x 1.4 cm (image 132 of series 4)).  The inferior mesenteric artery arises from the true lumen.  The right renal artery arises from the true lumen.  The left renal artery arises predominately from the false lumen, however,  there does appear to be the extension of the dissection flap into the proximal and mid left renal artery.  Abdominal aorta measures 3.1 cm at the level of the renal artery origins.  Infrarenal abdominal aorta is 2.5 x 2.3 cm adjacent to the IMA origin.   Visualized portions of the liver are unremarkable.  Small gallstones in the dependent portion of the gallbladder. Gallbladder does not appear distended, nor is there pericholecystic fluid or stranding at this time to suggest acute cholecystitis. The appearance the pancreas is unremarkable. Intermediate attenuation (47 HU on the noncontrast, 85 HU on postcontrast images) enhancing lesion in the superior aspect of the spleen measuring 2.0 cm. Multiple low attenuation lesions in the left kidney are too small to characterize.  Multiple low attenuation lesions in the right kidney appears similar to prior study, and are consistent with cysts, with the largest measuring at least 8.6 x 8.2 cm (these are incompletely visualized).  There is likely some mass effect upon the collecting system, as there appears to be mild hydronephrosis with some pooling of contrast material within the right renal collecting system dependently.   Musculoskeletal:  No aggressive appearing lytic or blastic lesions are noted in the visualized portions of the skeleton. As post median sternotomy.   IMPRESSION:   1.  Status post ascending aorta graft repair for previously noted type B dissection, now with enlarging dissecting thoracoabdominal aortic aneurysm which measures up to 7.3 cm in the proximal aortic arch.  There is extension of the dissection flap into the brachial cephalic artery, the  superior mesenteric artery and the left renal artery, as detailed above. 2.  At this time, there does not appear to be active extravasation from the aortic arch, or evidence of mediastinal hematoma to suggest recent bleeding.  Additionally, no pericardial fluid or thickening. 3. 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   Above findings were immediately communicated by phone to Dr. Para March by Dr. Llana Aliment on 09/11/2011 at 11:30 a.m. At that time, surgical consultation was recommended.   4.  Mild centrilobular emphysema and mild diffuse bronchial wall thickening. 5.  Multiple tiny pulmonary nodules in the lungs bilaterally, largest of which measures up to 6 mm in diameter in the lateral segment of the right middle lobe.  Given the smoking related changes in the lungs, the patient appears to be high risk for bronchogenic carcinoma, accordingly a follow-up chest CT at 6-12 months is recommended.   This recommendation follows the consensus statement: Guidelines for Management of Small Pulmonary Nodules Detected on CT Scans: A Statement from the Fleischner Society as published in Radiology 2005; 237:395-400. Online at: DietDisorder.cz. 6. Cholelithiasis without findings to suggest acute cholecystitis. 7.  Multiple renal cysts again noted. The largest cysts are present within the right kidney, and they may be clinically relevant, as they appear to exert some mass effect on the adjacent collecting system.  At this time, there appears to be some mild right-sided hydronephrosis with pooling of contrast material within the collecting system. 8.  Indeterminate splenic  lesion, as above.  This could be further characterized with MRI if clinically indicated.   Original Report Authenticated By: Florencia Reasons, M.D.      --------------------------------------------------------------------  Transthoracic Echocardiography  Patient:    Baylon, Santelli  MR #:       [id number]  Study Date: 02/28/2010  Gender:     M  Age:        83  Height:     182.9cm  Weight:     74.4kg  BSA:        1.34m^2  Pt. Status:  Room:    ADMITTING    [place]   ATTENDING    [place], [facility]   SONOGRAPHER  Al-Rammal, Missy   PERFORMING   [place], [place]   ORDERING     Bensimhon, Daniel   REFERRING    Bensimhon, Hardie Shackleton    Bensimhon  cc:   --------------------------------------------------------------------  History:  PMH: s/p Type A aortic dissection in 03/2009, which  extended from the aortic root through the femoral arteries,  complicated by tamponade. Acquired from the patient and from the  patient's chart. Atrial flutter. Coronary artery disease. Aorta  dissection. Risk factors: Frequent PVC's---bigeminy during much of  exam. Former tobacco use. Hypertension. Dyslipidemia.   --------------------------------------------------------------------  Study Conclusions   - Left ventricle: The cavity size was normal. Wall thickness was    normal. Systolic function was normal. The estimated ejection    fraction was in the range of 55% to 60%. Wall motion was normal;    there were no regional wall motion abnormalities. Doppler    parameters are consistent with abnormal left ventricular    relaxation (grade 1 diastolic dysfunction).  - Aortic valve: Valve area: 3.38cm^2 (Vmax).  - Mitral valve: Mild regurgitation.  - Left atrium: The atrium was mildly dilated.  - Pulmonary arteries: Systolic pressure was mildly increased.  Impressions:   - Dissection noted in abdominal aorta and aortic arch; arch appears    to be severely dilated;  suggest CTA or MRA to further assess.  Transthoracic echocardiography. M-mode, complete 2D, spectral  Doppler, and color Doppler. Height: Height: 182.9cm. Height: 72in.  Weight: Weight: 74.4kg. Weight: 163.7lb. Body mass index: BMI:  22.2kg/m^2. Body surface area: BSA: 1.54m^2. Blood pressure: 148/90.  Patient status: Outpatient. Location: [facility].   --------------------------------------------------------------------   --------------------------------------------------------------------  Left ventricle: The cavity size was normal. Wall thickness was  normal. Systolic function was normal. The estimated ejection  fraction was in the range of 55% to 60%. Wall motion was normal;  there were no regional wall motion abnormalities. Doppler parameters  are consistent with abnormal left ventricular relaxation (grade 1  diastolic dysfunction).   --------------------------------------------------------------------  Aortic valve: Trileaflet; normal thickness leaflets. Mobility was  not restricted. Doppler: Transvalvular velocity was within the  normal range. There was no stenosis. No regurgitation.  Valve area:  3.38cm^2 (Vmax). Indexed valve area: 1.72cm^2/m^2 (Vmax).   --------------------------------------------------------------------  Aorta: Aortic arch significantly dilated, with thrombus, and  probable old dissection flap. Abdominal aorta shows calcified  dissection flap. Aortic root: The aortic root was normal in size.   --------------------------------------------------------------------  Mitral valve: Structurally normal valve. Mobility was not  restricted. Doppler: Transvalvular velocity was within the normal  range. There was no evidence for stenosis. Mild regurgitation.  Peak  gradient: 3mm Hg (D).   --------------------------------------------------------------------  Left atrium: The atrium was mildly dilated.    --------------------------------------------------------------------  Right ventricle: The cavity size was normal. Systolic function was  normal.   --------------------------------------------------------------------  Pulmonic valve: The valve appears to be grossly normal. Doppler:  Transvalvular velocity was within the normal range. There was no  evidence for stenosis.   --------------------------------------------------------------------  Tricuspid valve: Structurally normal valve. Doppler: Transvalvular  velocity was within the normal range. Mild regurgitation.   --------------------------------------------------------------------  Pulmonary artery: Systolic pressure was mildly increased.   --------------------------------------------------------------------  Right atrium: The atrium was normal in size.   --------------------------------------------------------------------  Pericardium: There was no pericardial effusion.   --------------------------------------------------------------------  Systemic veins:  Inferior vena cava: The vessel was normal in size; the respirophasic  diameter changes were in the normal range (= 50%); findings are  consistent with normal central venous pressure.   --------------------------------------------------------------------  Post procedure conclusions  Ascending Aorta:   - Aortic arch significantly dilated, with thrombus, and probable old    dissection flap. Abdominal aorta shows calcified dissection flap.   --------------------------------------------------------------------   2D measurements            Normal  Doppler measurements       Normal  Left ventricle                     Aortic valve  LVID ED,       51.7 mm     43-52   Peak vel, S   120 cm/s     ------  chord, PLAX                        Area, Vmax   3.38 cm^2     ------  LVID ES,       35.1 mm     23-38   Area index   1.72 cm^2/m^2 ------  chord, PLAX                         (Vmax)  FS, chord,       32 %      >29     Mitral valve  PLAX                               Peak E vel   91.8 cm/s     ------  LVPW, ED       9.75 mm     ------  Peak A vel    107 cm/s     ------  IVS/LVPW       1.07        <1.3    Peak            3 mm Hg    ------  ratio, ED                          gradient, D  Vol ED, MOD1     96 ml     ------  Peak E/A      0.9          ------  Vol ES, MOD1     42 ml     ------  ratio  EF, MOD1         56 %      ------  Max regurg    517 cm/s     ------  Vol index, ED,   49 ml/m^2 ------  vel  MOD1                               Tricuspid valve  Vol index, ES,   21 ml/m^2 ------  Regurg peak  259 cm/s     ------  MOD1                               vel  Vol ED, MOD2     92 ml     ------  Peak RV-RA     27 mm Hg    ------  Vol ES, MOD2     41 ml     ------  gradient, S  EF, MOD2         55 %      ------  Stroke vol,      51 ml     ------  MOD2  Vol index, ED,   47 ml/m^2 ------  MOD2  Vol index, ES,   21 ml/m^2 ------  MOD2  Stroke index,    26 ml/m^2 ------  MOD2  Ventricular septum  IVS, ED        10.4 mm     ------  Aorta  Root diam, ED    36 mm     ------  Left atrium  AP dim           37 mm     ------  AP dim index   1.89 cm/m^2 <2.2   --------------------------------------------------------------------  Prepared and Electronically Authenticated by   Olga Millers, MD, [facility]     CORONARY CTA WITH CALCIUM SCORE 07/18/2009 11:03:00 AM    Ordering Physician: Marca Ancona    Reading Physician: Laurey Morale    Contrast: 100 cc    Indications: Abnormal myoview, history of aortic dissection    Procedure: Patient was pre-medicated with total of 15 mg IV metoprolol.    DETAILED FINDINGS:    Quality of Study: Good    Left Main: Mixed plaque in distal left main with no significant stenosis.    Left Anterior Descending: Mixed plaque in ostial LAD with < 25% stenosis.  There are two moderate-sized diagonals.  The  remainder of the LAD system has minimal plaque.    Left Circumflex: Large early OM1, moderate OM2, beyond OM2 the AV circumflex is a small vessel.  Minimal plaque in the circumflex.    Right Coronary Artery: Large, dominant vessel.  Soft plaque in the ostial RCA with no significant stenosis.    Coronary Calcium Score: 6.1 (0.5 RCA, 5.6 LAD).  This is the 22nd percentile for age, gender, and race.  Low risk.    The remainder of the thoracic findings including the aorta are reported separately by [facility] radiology.    IMPRESSION: There is minimal plaque with no significant stenosis in the coronary system.  The overall calcium score and the calcium score percentile are both low, signifying low risk.      Addendum Ends OVER-READ INTERPRETATION - CT CHEST    The following report is an over-read performed by radiologist Dr. Lorenda Ishihara. Molli Posey, M.D. of [facility] on 07/18/2009 3:13:37 PM.  This over-read does not include interpretation of cardiac or coronary anatomy or pathology.  The CTA interpretation by the cardiologist is attached.    Comparison:  Chest CT from 04/03/2009    Findings: 6 mm right middle lobe pulmonary nodule is identified. Compressive atelectasis is identified in the posterior aspect of both lower lobes.  Dissection flap is visualized in the descending thoracic aorta, as seen previously.  The transverse aorta and arch vessels have not been included on this study.  A tiny gas  locule identified in the right atrial appendage is probably from the intravenous access.    In the upper abdomen, a 1.7 cm low density lesion is identified in the spleen, not visible on the previous study, probably secondary to bolus timing.    IMPRESSION: Stable appearance of the 6 mm right middle lobe pulmonary nodule since 04/03/2009.  While this interval stability is reassuring, follow-up imaging is recommended. If the patient is at high risk for bronchogenic  carcinoma, follow-up chest CT at 6-12 months is recommended.  If the patient is at low risk for bronchogenic carcinoma, follow-up chest CT at 12 months is recommended.  This recommendation follows the consensus statement: Guidelines for Management of Small Pulmonary Nodules Detected on CT Scans: A Statement from the Fleischner Society as published in Radiology 2005; 237:395-400. Online at: DietDisorder.cz.    17 mm low density lesion in the spleen is probably a cyst or pseudocyst.  This could be sequelae of remote infarction.    Dissection flap identified in the descending thoracic aorta.   Provider: Ilean Skill, [place]     Imaging     CT Heart Morp W/Cta Cor W/Score W/Ca W/Cm &amp;/Or Wo/Cm (Order #[id number]) on 07/18/2009 - Imaging Information        Impression:  He has a 7.3 cm aortic aneurysm occurring within a chronically dissected aortic arch. Review of his CT angiogram when he came in with aortic dissection in 2010 showed that his aortic arch was enlarged to about 4.0-4.5 centimeters. The aortic dissection continued down throughout the thoracic and abdominal aorta. I think replacing his aortic arch would be an extremely high risk procedure under the current circumstances. The distal aortic arch is still significantly enlarged. 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 This will still be a technically challenging procedure due to his aortic dissection and I think it is best to have him evaluated at [facility] by Dr. Italy Hughes who is much more experience at stenting aortic dissections. I reviewed the CT scans with the patient and his wife and discussed the treatment options with them. I answered all their questions and they're in agreement with referral to Dr.  Kizzie Bane.  Plan:  I will discuss the case with Dr. Kizzie Bane and will call the patient to set up a referral after that.

## 2011-09-28 ENCOUNTER — Other Ambulatory Visit: Payer: Self-pay | Admitting: Family Medicine

## 2011-10-02 NOTE — Telephone Encounter (Signed)
Received refill request electronically. Is it okay to refill? 

## 2011-10-12 DIAGNOSIS — I712 Thoracic aortic aneurysm, without rupture: Secondary | ICD-10-CM

## 2011-10-12 DIAGNOSIS — I7122 Aneurysm of the aortic arch, without rupture: Secondary | ICD-10-CM

## 2011-10-12 HISTORY — PX: ABDOMINAL AORTIC ANEURYSM REPAIR: SHX42

## 2011-10-12 HISTORY — DX: Thoracic aortic aneurysm, without rupture: I71.2

## 2011-10-12 HISTORY — DX: Aneurysm of the aortic arch, without rupture: I71.22

## 2011-10-22 ENCOUNTER — Encounter: Payer: Self-pay | Admitting: Family Medicine

## 2011-10-27 ENCOUNTER — Inpatient Hospital Stay (HOSPITAL_COMMUNITY)
Admission: EM | Admit: 2011-10-27 | Discharge: 2011-10-31 | DRG: 194 | Disposition: A | Discharge status: Home / Self Care | Payer: Medicare Other | Attending: Critical Care Medicine | Admitting: Critical Care Medicine

## 2011-10-27 ENCOUNTER — Other Ambulatory Visit: Payer: Self-pay

## 2011-10-27 ENCOUNTER — Encounter (HOSPITAL_COMMUNITY): Payer: Self-pay

## 2011-10-27 ENCOUNTER — Emergency Department (HOSPITAL_COMMUNITY): Payer: Medicare Other

## 2011-10-27 DIAGNOSIS — Z833 Family history of diabetes mellitus: Secondary | ICD-10-CM

## 2011-10-27 DIAGNOSIS — Z79899 Other long term (current) drug therapy: Secondary | ICD-10-CM

## 2011-10-27 DIAGNOSIS — Z87891 Personal history of nicotine dependence: Secondary | ICD-10-CM

## 2011-10-27 DIAGNOSIS — E876 Hypokalemia: Secondary | ICD-10-CM | POA: Diagnosis present

## 2011-10-27 DIAGNOSIS — Z951 Presence of aortocoronary bypass graft: Secondary | ICD-10-CM

## 2011-10-27 DIAGNOSIS — Z9889 Other specified postprocedural states: Secondary | ICD-10-CM

## 2011-10-27 DIAGNOSIS — R0902 Hypoxemia: Secondary | ICD-10-CM | POA: Diagnosis present

## 2011-10-27 DIAGNOSIS — J13 Pneumonia due to Streptococcus pneumoniae: Secondary | ICD-10-CM

## 2011-10-27 DIAGNOSIS — J9601 Acute respiratory failure with hypoxia: Secondary | ICD-10-CM

## 2011-10-27 DIAGNOSIS — E785 Hyperlipidemia, unspecified: Secondary | ICD-10-CM | POA: Diagnosis present

## 2011-10-27 DIAGNOSIS — Z8546 Personal history of malignant neoplasm of prostate: Secondary | ICD-10-CM

## 2011-10-27 DIAGNOSIS — J9600 Acute respiratory failure, unspecified whether with hypoxia or hypercapnia: Secondary | ICD-10-CM

## 2011-10-27 DIAGNOSIS — I4892 Unspecified atrial flutter: Secondary | ICD-10-CM | POA: Diagnosis present

## 2011-10-27 DIAGNOSIS — K5730 Diverticulosis of large intestine without perforation or abscess without bleeding: Secondary | ICD-10-CM | POA: Diagnosis present

## 2011-10-27 DIAGNOSIS — I429 Cardiomyopathy, unspecified: Secondary | ICD-10-CM | POA: Diagnosis present

## 2011-10-27 DIAGNOSIS — Z823 Family history of stroke: Secondary | ICD-10-CM

## 2011-10-27 DIAGNOSIS — D62 Acute posthemorrhagic anemia: Secondary | ICD-10-CM | POA: Diagnosis present

## 2011-10-27 DIAGNOSIS — I10 Essential (primary) hypertension: Secondary | ICD-10-CM | POA: Diagnosis present

## 2011-10-27 DIAGNOSIS — I2510 Atherosclerotic heart disease of native coronary artery without angina pectoris: Secondary | ICD-10-CM | POA: Diagnosis present

## 2011-10-27 DIAGNOSIS — J189 Pneumonia, unspecified organism: Principal | ICD-10-CM | POA: Diagnosis present

## 2011-10-27 DIAGNOSIS — Z8249 Family history of ischemic heart disease and other diseases of the circulatory system: Secondary | ICD-10-CM

## 2011-10-27 DIAGNOSIS — Z7982 Long term (current) use of aspirin: Secondary | ICD-10-CM

## 2011-10-27 DIAGNOSIS — I71019 Dissection of thoracic aorta, unspecified: Secondary | ICD-10-CM | POA: Diagnosis present

## 2011-10-27 HISTORY — DX: Presence of aortocoronary bypass graft: Z95.1

## 2011-10-27 LAB — CBC
MCH: 27.5 pg (ref 26.0–34.0)
MCHC: 32 g/dL (ref 30.0–36.0)
MCHC: 31.9 g/dL (ref 30.0–36.0)
MCV: 86.2 fL (ref 78.0–100.0)
Platelets: 309 10*3/uL (ref 150–400)
RDW: 15.1 % (ref 11.5–15.5)

## 2011-10-27 LAB — CARDIAC PANEL(CRET KIN+CKTOT+MB+TROPI)
Relative Index: 1.8 (ref 0.0–2.5)
Total CK: 147 U/L (ref 7–232)
Troponin I: <0.3 ng/mL (ref <0.30)

## 2011-10-27 LAB — BASIC METABOLIC PANEL
BUN: 14 mg/dL (ref 6–23)
GFR calc Af Amer: >90 mL/min (ref >90)
GFR calc non Af Amer: >90 mL/min (ref >90)
Potassium: 4 mEq/L (ref 3.5–5.1)
Sodium: 137 mEq/L (ref 135–145)

## 2011-10-27 LAB — COMPREHENSIVE METABOLIC PANEL
AST: 29 U/L (ref 0–37)
Albumin: 3.1 g/dL — ABNORMAL LOW (ref 3.5–5.2)
Alkaline Phosphatase: 53 U/L (ref 39–117)
BUN: 11 mg/dL (ref 6–23)
CO2: 30 mEq/L (ref 19–32)
Chloride: 98 mEq/L (ref 96–112)
Creatinine, Ser: 0.69 mg/dL (ref 0.50–1.35)
GFR calc non Af Amer: >90 mL/min (ref >90)
Potassium: 3.7 mEq/L (ref 3.5–5.1)
Total Bilirubin: 0.7 mg/dL (ref 0.3–1.2)

## 2011-10-27 LAB — DIFFERENTIAL
Basophils Absolute: 0.1 10*3/uL (ref 0.0–0.1)
Eosinophils Absolute: 0.3 10*3/uL (ref 0.0–0.7)
Lymphocytes Relative: 13 % (ref 12–46)
Neutro Abs: 7.5 10*3/uL (ref 1.7–7.7)
Neutrophils Relative %: 70 % (ref 43–77)

## 2011-10-27 MED ORDER — FUROSEMIDE 20 MG PO TABS
20.0000 mg | ORAL_TABLET | Freq: Two times a day (BID) | ORAL | Status: DC
  Administered 2011-10-27 – 2011-10-31 (×8): 20 mg via ORAL
  Filled 2011-10-27 (×9): qty 1

## 2011-10-27 MED ORDER — ADULT MULTIVITAMIN W/MINERALS CH
1.0000 | ORAL_TABLET | Freq: Every day | ORAL | Status: DC
  Administered 2011-10-28 – 2011-10-31 (×4): 1 via ORAL
  Filled 2011-10-27 (×4): qty 1

## 2011-10-27 MED ORDER — PIPERACILLIN-TAZOBACTAM 3.375 G IVPB
3.3750 g | Freq: Once | INTRAVENOUS | Status: AC
  Administered 2011-10-27: 3.375 g via INTRAVENOUS
  Filled 2011-10-27: qty 50

## 2011-10-27 MED ORDER — SODIUM CHLORIDE 0.9 % IV SOLN: INTRAVENOUS | Status: DC

## 2011-10-27 MED ORDER — VANCOMYCIN HCL 1000 MG IV SOLR
1250.0000 mg | Freq: Two times a day (BID) | INTRAVENOUS | Status: DC
  Administered 2011-10-28 – 2011-10-30 (×5): 1250 mg via INTRAVENOUS
  Filled 2011-10-27 (×7): qty 1250

## 2011-10-27 MED ORDER — ASPIRIN EC 81 MG PO TBEC
81.0000 mg | DELAYED_RELEASE_TABLET | Freq: Every day | ORAL | Status: DC
  Administered 2011-10-27 – 2011-10-31 (×5): 81 mg via ORAL
  Filled 2011-10-27 (×5): qty 1

## 2011-10-27 MED ORDER — ATORVASTATIN CALCIUM 10 MG PO TABS
10.0000 mg | ORAL_TABLET | Freq: Every day | ORAL | Status: DC
  Administered 2011-10-27 – 2011-10-30 (×4): 10 mg via ORAL
  Filled 2011-10-27 (×5): qty 1

## 2011-10-27 MED ORDER — ALBUTEROL SULFATE (5 MG/ML) 0.5% IN NEBU
2.5000 mg | INHALATION_SOLUTION | Freq: Four times a day (QID) | RESPIRATORY_TRACT | Status: DC
  Administered 2011-10-27 – 2011-10-29 (×6): 2.5 mg via RESPIRATORY_TRACT
  Filled 2011-10-27 (×6): qty 0.5

## 2011-10-27 MED ORDER — HEPARIN SODIUM (PORCINE) 5000 UNIT/ML IJ SOLN
5000.0000 [IU] | Freq: Three times a day (TID) | INTRAMUSCULAR | Status: DC
  Administered 2011-10-27 – 2011-10-31 (×10): 5000 [IU] via SUBCUTANEOUS
  Filled 2011-10-27 (×14): qty 1

## 2011-10-27 MED ORDER — VANCOMYCIN HCL IN DEXTROSE 1-5 GM/200ML-% IV SOLN
1000.0000 mg | Freq: Once | INTRAVENOUS | Status: AC
  Administered 2011-10-27: 1000 mg via INTRAVENOUS
  Filled 2011-10-27: qty 200

## 2011-10-27 MED ORDER — FERROUS SULFATE 325 (65 FE) MG PO TABS
325.0000 mg | ORAL_TABLET | Freq: Two times a day (BID) | ORAL | Status: DC
  Administered 2011-10-28 – 2011-10-31 (×7): 325 mg via ORAL
  Filled 2011-10-27 (×9): qty 1

## 2011-10-27 MED ORDER — ACETAMINOPHEN 325 MG PO TABS
650.0000 mg | ORAL_TABLET | ORAL | Status: DC | PRN
  Administered 2011-10-28: 650 mg via ORAL
  Filled 2011-10-27: qty 2

## 2011-10-27 MED ORDER — PIPERACILLIN-TAZOBACTAM 3.375 G IVPB
3.3750 g | Freq: Three times a day (TID) | INTRAVENOUS | Status: DC
  Administered 2011-10-27 – 2011-10-30 (×7): 3.375 g via INTRAVENOUS
  Filled 2011-10-27 (×12): qty 50

## 2011-10-27 MED ORDER — OXYCODONE HCL 5 MG PO TABS
5.0000 mg | ORAL_TABLET | Freq: Four times a day (QID) | ORAL | Status: DC | PRN
  Administered 2011-10-28 – 2011-10-30 (×6): 5 mg via ORAL
  Filled 2011-10-27 (×2): qty 1
  Filled 2011-10-27: qty 2
  Filled 2011-10-27 (×3): qty 1

## 2011-10-27 MED ORDER — ASPIRIN 81 MG PO TABS: 81.0000 mg | ORAL_TABLET | Freq: Every day | ORAL | Status: DC

## 2011-10-27 MED ORDER — POTASSIUM CHLORIDE ER 10 MEQ PO TBCR
10.0000 meq | EXTENDED_RELEASE_TABLET | Freq: Two times a day (BID) | ORAL | Status: DC
  Administered 2011-10-27 – 2011-10-31 (×8): 10 meq via ORAL
  Filled 2011-10-27 (×9): qty 1

## 2011-10-27 MED ORDER — SENNOSIDES-DOCUSATE SODIUM 8.6-50 MG PO TABS
2.0000 | ORAL_TABLET | Freq: Two times a day (BID) | ORAL | Status: DC
  Administered 2011-10-27 – 2011-10-29 (×5): 2 via ORAL
  Filled 2011-10-27 (×9): qty 2

## 2011-10-27 MED ORDER — SENNA-DOCUSATE SODIUM 8.6-50 MG PO TABS: 2.0000 | ORAL_TABLET | Freq: Two times a day (BID) | ORAL | Status: DC

## 2011-10-27 MED ORDER — FENOFIBRATE 160 MG PO TABS
160.0000 mg | ORAL_TABLET | Freq: Every day | ORAL | Status: DC
  Administered 2011-10-27 – 2011-10-31 (×5): 160 mg via ORAL
  Filled 2011-10-27 (×5): qty 1

## 2011-10-27 MED ORDER — PANTOPRAZOLE SODIUM 40 MG PO TBEC
40.0000 mg | DELAYED_RELEASE_TABLET | Freq: Every day | ORAL | Status: DC
  Administered 2011-10-27 – 2011-10-31 (×5): 40 mg via ORAL
  Filled 2011-10-27 (×5): qty 1

## 2011-10-27 MED ORDER — ACETAMINOPHEN 650 MG PO TABS: 650.0000 mg | ORAL_TABLET | ORAL | Status: DC | PRN

## 2011-10-27 MED ORDER — METOPROLOL TARTRATE 25 MG PO TABS
25.0000 mg | ORAL_TABLET | Freq: Two times a day (BID) | ORAL | Status: DC
  Administered 2011-10-27 – 2011-10-28 (×3): 25 mg via ORAL
  Filled 2011-10-27 (×5): qty 1

## 2011-10-27 NOTE — ED Notes (Signed)
[facility] has completed evaluation and states he will admit to ICU

## 2011-10-27 NOTE — ED Notes (Signed)
Pt resting quietly with family at bedside.

## 2011-10-27 NOTE — H&P (Addendum)
Name: Ray Fowler MRN: [id number] DOB: 06-02-1941    LOS: 0  PCCM ADMISSION NOTE  History of Present Illness: The patient is a 71 year old gentleman history of hypertension and hyperlipidemia who presented in August 2010 with an acute type A aortic dissection. The patient was transferred to [facility] and underwent surgical repair with resuspension of his aortic valve and replacement of the ascending aorta and hemiarch with a 24 mm hemashield graft by Dr. Nevada Crane. The patient apparently had some problems with postoperative coagulopathy but this resolved and he had an otherwise uncomplicated postoperative course. He was seen a few weeks later by Dr. Kittie Plater from cardiology for atrial flutter which was treated with medication and resolved. He said that he has not had any further followup CT scans until recently. He was seen by his primary physician and treated for possible pneumonia in November of 2012. He said that he recently had a followup chest x-ray to be sure the pneumonia had resolved and this showed widening of the mediastinum. 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 The patient was subsequently referred by Dr. Laneta Simmers of thoracic surgery to [facility] for a stent procedure of the descending aorta along with bypass surgery. The patient had an innominate artery bypass grafting repair done and an aortic repair to the left common carotid with associated stenting procedure. The patient apparently was just discharged from [facility] on 10/26/2011. The patient's had prior pneumonias noted. The patient had  overnight increasing cough and congestion. The patient presents to [facility] emergency room 10/27/11 with HCAP PNA. The patient was initially offered transfer to [facility] the patient declined this and wish to be admitted to [facility]   Critical care was called to admit this patient due to his  recent operative status.  Lines / Drains: none  Cultures: BCx2 3/16 Sputum c/s 3/16 UC 3/16   Antibiotics: Zosyn (hcap) 3/16 Vanco (hcap)_ 3/16  Tests / Events: none    Past Medical History  Diagnosis Date  . Prostate cancer   . Diverticulosis   . HTN (hypertension)   . Allergic rhinitis   . Aortic dissection     Type A  . Carotid bruit     Right. Carotid u/s 2/11 normal.  . Pulmonary nodule     Stable on CT 2/11  . Hyperlipidemia   . S/P CABG x 1    Past Surgical History  Procedure Date  . Hemi arch replacement 03/2009    and AoV resuspension w/ Repl of Ascending Aorta and Hemiarch w/ Graft; Pericardial Tamponade Aortic Regurg 8/22-8/28/10;   . Prostatectomy 12/1993    Dr. Isabel Caprice  . Penile prosthesis implant   . Flex- polyp  06/1999    at 38cm, Int/Ext Hemm divertics Russella Dar)  . Axillary mass removal 05/2000    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Kuzma)  . [facility]-8/28/2010    Thoracic aortic dissection pericardial teamponade aortic regurg  . Colonoscopy   . Polypectomy   . Cardiac surgery    Prior to Admission medications   Medication Sig Start Date End Date Taking? Authorizing Provider  Acetaminophen 650 MG TABS Take 1,300 mg by mouth every 4 (four) hours as needed. For pain   Yes Historical Provider, MD  aspirin 81 MG tablet 81 mg. Take one tablet every other day.   Yes Historical Provider, MD  atorvastatin (LIPITOR) 10 MG tablet Take 10  mg by mouth at bedtime.   Yes Historical Provider, MD  dextromethorphan-guaiFENesin (MUCINEX DM) 30-600 MG per 12 hr tablet Take 1 tablet by mouth as needed.     Yes Historical Provider, MD  fenofibrate (TRICOR) 145 MG tablet Take one by mouth every other day 03/28/11  Yes Arta Silence, MD  ferrous sulfate 325 (65 FE) MG tablet Take 325 mg by mouth 2 (two) times daily with a meal.   Yes Historical Provider, MD  fluticasone (FLONASE) 50 MCG/ACT nasal spray USE TWO SPRAYS EACH NOSTRIL EVERY DAY  09/28/11  Yes Joaquim Nam, MD  fluticasone (FLOVENT HFA) 110 MCG/ACT inhaler Inhale 1 puff into the lungs as needed.     Yes Historical Provider, MD  furosemide (LASIX) 20 MG tablet Take 20 mg by mouth 2 (two) times daily.   Yes Historical Provider, MD  metoprolol tartrate (LOPRESSOR) 25 MG tablet Take 25 mg by mouth 2 (two) times daily.   Yes Historical Provider, MD  Multiple Vitamin (MULITIVITAMIN WITH MINERALS) TABS Take 1 tablet by mouth daily.   Yes Historical Provider, MD  oxyCODONE (OXY IR/ROXICODONE) 5 MG immediate release tablet Take 5 mg by mouth every 6 (six) hours as needed. For pain   Yes Historical Provider, MD  potassium chloride (K-DUR) 10 MEQ tablet Take 10 mEq by mouth 2 (two) times daily.   Yes Historical Provider, MD  sennosides-docusate sodium (SENOKOT-S) 8.6-50 MG tablet Take 2 tablets by mouth 2 (two) times daily.   Yes Historical Provider, MD   Allergies No Known Allergies  Family History Family History  Problem Relation Age of Onset  . Heart failure Mother     died age 51  . Hypertension Mother   . Heart disease Mother     CHF  . Kidney failure Father   . Heart disease Father     coronary disease, CABG  . Stroke Father     due to "infection" from [place]  . Kidney disease Father     chronic  . Diabetes Other   . Hyperlipidemia Daughter   . Diabetes Other   . Colon cancer Neg Hx   . Esophageal cancer Neg Hx   . Stomach cancer Neg Hx   . Rectal cancer Neg Hx     Social History  reports that he quit smoking about 17 years ago. His smoking use included Cigarettes. He has a 25 pack-year smoking history. He has never used smokeless tobacco. He reports that he drinks about .5 ounces of alcohol per week. He reports that he does not use illicit drugs.  Review Of Systems   Constitutional:   No  weight loss, night sweats, ++  Fevers, no  Chills,++  fatigue, lassitude. HEENT:   No headaches,  Difficulty swallowing,  Tooth/dental problems,  Sore throat,                 No sneezing, itching, ear ache, nasal congestion, post nasal drip,   CV:  No chest pain,  Orthopnea, PND, swelling in lower extremities, anasarca, dizziness, palpitations  GI  No heartburn, indigestion, abdominal pain, nausea, vomiting, diarrhea, change in bowel habits, loss of appetite  Resp: Notes  shortness of breath with exertion and at rest.  No excess mucus, no productive cough,  Notes  non-productive cough,  No coughing up of blood.  No change in color of mucus.  No wheezing.  No chest wall deformity  Skin: no rash or lesions.  GU: no dysuria, change in color  of urine, no urgency or frequency.  No flank pain.  MS:  No joint pain or swelling.  No decreased range of motion.  No back pain.  Psych:  No change in mood or affect. No depression or anxiety.  No memory loss.  Vital Signs: Temp:  [98.2 F (36.8 C)-98.4 F (36.9 C)] 98.4 F (36.9 C) (03/16 1727) Pulse Rate:  [107-127] 127  (03/16 1727) Resp:  [22] 22  (03/16 1727) BP: (111-133)/(66-89) 133/89 mmHg (03/16 1727) SpO2:  [92 %-98 %] 98 % (03/16 1727)    Physical Examination: General:  Ill-appearing white male in no distress Neuro:  Awake and alert moves all fours no focal deficits   HEENT:  Dry mucous membranes no other lesions seen Neck:  Neck supple no thyromegaly   Cardiovascular:  Chest wall shows recent surgical scars with staples in place both along the neck  and median sternotomy Lungs:  Distant breath sounds, bilateral coarse rhonchi and coarse breath sounds Abdomen:  Soft nontender bowel sounds active no organomegaly Musculoskeletal:  Full range of motion no joint deformity Skin:  Clear      Labs and Imaging:  Reviewed.  Please refer to the Assessment and Plan section for relevant results. Dg Chest Port 1 View  10/27/2011  *RADIOLOGY REPORT*  Clinical Data: Cough, short of breath  PORTABLE CHEST - 1 VIEW  Comparison: No CT 09/11/2011  Findings: Interval stent graft repair of thoracic aortic  dissection.  The graft appears well expanded.  There is left lower lobe atelectasis and left retrocardiac density.  There is mild air space disease in the right lower lobe.  No pneumothorax.  IMPRESSION:  1.  Stent graft appears well expanded. 2.  Left lower lobe atelectasis versus infiltrate. 3.  Mild air space disease in the right lower lobe representing infection or edema.  Original Report Authenticated By: Genevive Bi, M.D.    Lab 10/27/11 1459  NA 137  K 4.0  CL 96  CO2 30  GLUCOSE 101*  BUN 14  CREATININE 0.73  CALCIUM 9.1  MG --  PHOS --    Lab 10/27/11 1459  HGB 8.0*  HCT 25.0*  WBC 10.7*  PLT 336    Assessment and Plan: Patient Active Hospital Problem List: HCAP (healthcare-associated pneumonia) (10/27/2011)   Assessment: Bilateral lower lobe infiltrates compatible with atelectasis versus airspace disease compatible with hospital acquired pneumonia    Plan: Cover with Zosyn and vancomycin for now and titrate oxygen obtain cultures   CORONARY ARTERY DISEASE (04/03/2009)   Assessment: Status post bypass surgery    Plan: Stable to this time without evidence of active ongoing ischemia    CARDIOMYOPATHY, 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)   Assessment: 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    Plan: Monitor volume status    Atrial flutter (05/11/2009)   Assessment: History of atrial flutter but currently now in sinus rhythm    Plan: Monitor on telemetry   Dissecting aortic aneurysm (any part), 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/2011)   Assessment: Status post repair with stenting procedure of ascending aortic aneurysm and bypass surgery    Plan: Will confer with [facility]  hospital but at this point the patient desires admission to [facility] and due to has agreed to admit this patient but will await and see how the patient progressed   H/O ascending aorta repair (10/27/2011)   Assessment: As above    Plan: As above   Hypoxemia (10/27/2011)   Assessment: Due to bilateral pulmonary  infiltrates  Plan: Titrate oxygen, bronchodilator therapy, flutter valve and incentive spirometry  Post op anemia Assessment:  Need records from [facility] Plan Observation for now,  Transfuse if Hgb < 8.0 Cont Fe supp  Best practices / Disposition: -->ICU status under PCCM -->full code -->Heparin for DVT Px -->Protonix for GI Px --->diet  2 gm Na -->family updated at bedside  The patient is critically ill with multiple organ systems failure and requires high complexity decision making for assessment and support, frequent evaluation and titration of therapies, application of advanced monitoring technologies and extensive interpretation of multiple databases. Critical Care Time devoted to patient care services described in this note is 35 minutes.  Shan Levans  M.D. Pulmonary and Critical Care Medicine [facility] Cell: [phone]   Pager: [phone] 10/27/2011, 6:15 PM

## 2011-10-27 NOTE — ED Notes (Signed)
MD at bedside. Dr. Nino Parsley

## 2011-10-27 NOTE — ED Provider Notes (Cosign Needed)
5:46 PM Pt's case reviewed with Dr. Madilyn Fireman, one of the cardiothoracic surgeons who had cared for pt at [facility].  He offered to admit pt there.  However, pt wants to stay at [facility].  Will contact Pulmonary and Critical Care to admit him to ICU.    6:24 PM Shan Levans, M.D., of PCCM, saw and admitted pt to ICU.   Carleene Cooper III, MD 10/27/11 [phone]

## 2011-10-27 NOTE — ED Notes (Signed)
Xray at bedside for stat p xray

## 2011-10-27 NOTE — ED Provider Notes (Signed)
History     CSN: [id number]  Arrival date & time 10/27/11  1358   First MD Initiated Contact with Patient 10/27/11 1409      Chief Complaint  Patient presents with  . Nasal Congestion    (Consider location/radiation/quality/duration/timing/severity/associated sxs/prior treatment) The history is provided by the patient and the spouse. The history is limited by the condition of the patient.   the patient is a 71 year old, male, who presents to emergency department complaining of a cough, and shortness of breath.  He recently was released from [facility], where he had an aortic dissection repair.  He denies pain.  He has had a non-productive "forced" cough.  He denies fevers, chills, lightheadedness.  Level V caveat applies for urgent need for intervention, and critical illness  Past Medical History  Diagnosis Date  . Prostate cancer   . Diverticulosis   . HTN (hypertension)   . Allergic rhinitis   . Aortic dissection     Type A  . Carotid bruit     Right. Carotid u/s 2/11 normal.  . Pulmonary nodule     Stable on CT 2/11  . Hyperlipidemia   . S/P CABG x 1     Past Surgical History  Procedure Date  . Hemi arch replacement 03/2009    and AoV resuspension w/ Repl of Ascending Aorta and Hemiarch w/ Graft; Pericardial Tamponade Aortic Regurg 8/22-8/28/10;   . Prostatectomy 12/1993    Dr. Isabel Caprice  . Penile prosthesis implant   . Flex- polyp  06/1999    at 38cm, Int/Ext Hemm divertics Russella Dar)  . Axillary mass removal 05/2000    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Kuzma)  . [facility] 08/22-8/28/2010    Thoracic aortic dissection pericardial teamponade aortic regurg  . Colonoscopy   . Polypectomy   . Cardiac surgery     Family History  Problem Relation Age of Onset  . Heart failure Mother     died age 32  . Hypertension Mother   . Heart disease Mother     CHF  . Kidney failure Father   . Heart disease Father     coronary disease, CABG  . Stroke Father      due to "infection" from [place]  . Kidney disease Father     chronic  . Diabetes Other   . Hyperlipidemia Daughter   . Diabetes Other   . Colon cancer Neg Hx   . Esophageal cancer Neg Hx   . Stomach cancer Neg Hx   . Rectal cancer Neg Hx     History  Substance Use Topics  . Smoking status: Former Smoker -- 1.0 packs/day for 25 years    Types: Cigarettes    Quit date: 07/10/1994  . Smokeless tobacco: Never Used   Comment: quit 1995  . Alcohol Use: 0.5 oz/week    1 drink(s) per week     occassionally wine      Review of Systems  Constitutional: Negative for fever and chills.  Respiratory: Positive for cough and shortness of breath. Negative for chest tightness.   Cardiovascular: Negative for chest pain and leg swelling.  Gastrointestinal: Negative for nausea and vomiting.  Neurological: Negative for dizziness and light-headedness.  All other systems reviewed and are negative.    Allergies  Review of patient's allergies indicates no known allergies.  Home Medications   Current Outpatient Rx  Name Route Sig Dispense Refill  . AMLODIPINE BESYLATE 5 MG PO TABS  2.5 mg 2 (two) times  daily.     . ASPIRIN 81 MG PO TABS  81 mg. Take one tablet every other day.    . ATORVASTATIN CALCIUM 10 MG PO TABS  TAKE ONE TABLET BY MOUTH NIGHTLY AT BEDTIME 30 tablet 6  . CARVEDILOL 25 MG PO TABS  TAKE ONE TABLET BY MOUTH TWICE DAILY 60 tablet 6  . DM-GUAIFENESIN ER 30-600 MG PO TB12 Oral Take 1 tablet by mouth as needed.      . FENOFIBRATE 145 MG PO TABS  Take one by mouth every other day    . FLUTICASONE PROPIONATE 50 MCG/ACT NA SUSP  USE TWO SPRAYS EACH NOSTRIL EVERY DAY 16 g 6  . FLUTICASONE PROPIONATE  HFA 110 MCG/ACT IN AERO Inhalation Inhale 1 puff into the lungs as needed.      Marland Kitchen LISINOPRIL 20 MG PO TABS  TAKE ONE TABLET BY MOUTH TWICE DAILY 60 tablet 8  . ICAPS MV PO Oral Take 1 tablet by mouth 1 day or 1 dose.      BP 124/66  Pulse 107  Temp(Src) 98.2 F (36.8 C)  (Oral)  Resp 22  SpO2 93%  Physical Exam  Vitals reviewed. Constitutional: He is oriented to person, place, and time. He appears well-developed and well-nourished. He appears distressed.       Anxious and ill appearing  HENT:  Head: Normocephalic and atraumatic.  Eyes: Conjunctivae are normal. Pupils are equal, round, and reactive to light.  Neck: Normal range of motion. Neck supple.  Cardiovascular:  No murmur heard.      Tachycardia  Pulmonary/Chest: Effort normal and breath sounds normal.       Hacking cough  Abdominal: He exhibits no distension.  Musculoskeletal: Normal range of motion.  Neurological: He is alert and oriented to person, place, and time.  Skin: Skin is warm and dry.       Multiple postoperative surgical incisions on the chest and left anterior neck.  They appear to be healing with no signs of infection.  Psychiatric: He has a normal mood and affect. Judgment and thought content normal.    ED Course  Procedures (including critical care time) 71 year old, male, recently.  Postop presents to the emergency department with a hacking cough, shortness of breath, and room air, hypoxia.  His oxygen improves on nasal cannula to 93 range.  His lungs are clear to auscultation.  I will perform a chest x-ray, to look for pneumonia, but I am also concerned that he may have a pulmonary embolism.  We will establish an IV perform laboratory testing, and a chest x-ray is indicated.  We will perform a CT of his chest and I will consult his surgeon and [facility], as well.   Labs Reviewed  CBC  DIFFERENTIAL  BASIC METABOLIC PANEL   No results found.   No diagnosis found.  ED ECG REPORT   Date: 10/27/2011  EKG Time: 3:46 PM  Rate: 113  Rhythm: sinus tachycardia, occ pvc  Axis: nl  Intervals:none  ST&T Change: none  Narrative Interpretation: sinus tachycardia with occ pvc     3:56 PM I called Dr. Barbara Cower at [facility].  [phone].  His colleague on call  is in the OR.  He will call when finished.   I turned the pt over to Dr. Ignacia Palma.  He will complete treatment and our hope to transfer to [facility] for further eval and tx.     MDM  Postoperative cough, tachycardia, and hypoxia  Cheri Guppy, MD 10/27/11 [phone]

## 2011-10-27 NOTE — ED Notes (Signed)
Pt had cardiac surgery and was d/c from [facility] yesterday.  Pt states he has had congestion x 4 days, worse today and that he is exhausted from coughing

## 2011-10-27 NOTE — ED Notes (Signed)
O2 off pt became sweaty and shob with o2 sat to 88%.

## 2011-10-27 NOTE — Progress Notes (Signed)
ANTIBIOTIC CONSULT NOTE - INITIAL  Pharmacy Consult for Vancomycin/Zosyn Indication: HCAP  No Known Allergies  Patient Measurements: Height: 6' 0.05" (183 cm) Weight: 164 lb 14.5 oz (74.8 kg) IBW/kg (Calculated) : 77.71   Vital Signs: Temp: 98.4 F (36.9 C) (03/16 1727) Temp src: Oral (03/16 1400) BP: 125/82 mmHg (03/16 1800) Pulse Rate: 119  (03/16 1800) Intake/Output from previous day:   Intake/Output from this shift:    Labs:  Basename 10/27/11 1459  WBC 10.7*  HGB 8.0*  PLT 336  LABCREA --  CREATININE 0.73   Estimated Creatinine Clearance: 92.2 ml/min (by C-G formula based on Cr of 0.73). No results found for this basename: VANCOTROUGH:2,VANCOPEAK:2,VANCORANDOM:2,GENTTROUGH:2,GENTPEAK:2,GENTRANDOM:2,TOBRATROUGH:2,TOBRAPEAK:2,TOBRARND:2,AMIKACINPEAK:2,AMIKACINTROU:2,AMIKACIN:2, in the last 72 hours   Microbiology: No results found for this or any previous visit (from the past 720 hour(s)).  Medical History: Past Medical History  Diagnosis Date  . Prostate cancer   . Diverticulosis   . HTN (hypertension)   . Allergic rhinitis   . Aortic dissection     Type A  . Carotid bruit     Right. Carotid u/s 2/11 normal.  . Pulmonary nodule     Stable on CT 2/11  . Hyperlipidemia   . S/P CABG x 1     Medications:   (Not in a hospital admission) Assessment: 71 y/o male patient admitted with chest congestion, cough requiring broad spectrum antibiotics for HCAP. Received 1g Vanc and 3.375g zosyn in ED.  Goal of Therapy:  Vancomycin trough level 15-20 mcg/ml  Plan:  Vancomycin 1250mg  IV q12 and zosyn 3.375g IV q8h, will monitor renal fxn. Measure antibiotic drug levels at steady state Follow up culture results  Verlene Mayer, PharmD, BCPS Pager [phone] 10/27/2011,7:28 PM

## 2011-10-28 ENCOUNTER — Inpatient Hospital Stay (HOSPITAL_COMMUNITY): Payer: Medicare Other

## 2011-10-28 ENCOUNTER — Encounter (HOSPITAL_COMMUNITY): Payer: Self-pay

## 2011-10-28 DIAGNOSIS — I7101 Dissection of thoracic aorta: Secondary | ICD-10-CM

## 2011-10-28 DIAGNOSIS — Z9889 Other specified postprocedural states: Secondary | ICD-10-CM

## 2011-10-28 DIAGNOSIS — R0902 Hypoxemia: Secondary | ICD-10-CM

## 2011-10-28 DIAGNOSIS — J189 Pneumonia, unspecified organism: Principal | ICD-10-CM

## 2011-10-28 LAB — CBC
HCT: 23.5 % — ABNORMAL LOW (ref 39.0–52.0)
Hemoglobin: 7.6 g/dL — ABNORMAL LOW (ref 13.0–17.0)
MCH: 27.8 pg (ref 26.0–34.0)
MCHC: 32.3 g/dL (ref 30.0–36.0)
RDW: 15.2 % (ref 11.5–15.5)

## 2011-10-28 LAB — URINALYSIS, ROUTINE W REFLEX MICROSCOPIC
Glucose, UA: NEGATIVE mg/dL
Hgb urine dipstick: NEGATIVE
Leukocytes, UA: NEGATIVE
Protein, ur: NEGATIVE mg/dL
Specific Gravity, Urine: 1.012 (ref 1.005–1.030)
Urobilinogen, UA: 0.2 mg/dL (ref 0.0–1.0)

## 2011-10-28 LAB — STREP PNEUMONIAE URINARY ANTIGEN: Strep Pneumo Urinary Antigen: NEGATIVE

## 2011-10-28 LAB — LEGIONELLA ANTIGEN, URINE

## 2011-10-28 LAB — BASIC METABOLIC PANEL
BUN: 11 mg/dL (ref 6–23)
Calcium: 8.3 mg/dL — ABNORMAL LOW (ref 8.4–10.5)
Creatinine, Ser: 0.73 mg/dL (ref 0.50–1.35)
GFR calc Af Amer: >90 mL/min (ref >90)
GFR calc non Af Amer: >90 mL/min (ref >90)
Glucose, Bld: 94 mg/dL (ref 70–99)

## 2011-10-28 LAB — CARDIAC PANEL(CRET KIN+CKTOT+MB+TROPI)
Relative Index: 1.7 (ref 0.0–2.5)
Relative Index: INVALID (ref 0.0–2.5)
Total CK: 81 U/L (ref 7–232)
Troponin I: <0.3 ng/mL (ref <0.30)

## 2011-10-28 MED ORDER — SODIUM CHLORIDE 0.9 % IV SOLN
INTRAVENOUS | Status: DC
  Administered 2011-10-28: 14:00:00 via INTRAVENOUS

## 2011-10-28 NOTE — Progress Notes (Signed)
Name: Ray Fowler MRN: [id number] DOB: 1941-02-04    LOS: 1  PCCM Progress  NOTE  History of Present Illness: The patient is a 71 year old gentleman history of hypertension and hyperlipidemia who presented in August 2010 with an acute type A aortic dissection. The patient was transferred to [facility] and underwent surgical repair with resuspension of his aortic valve and replacement of the ascending aorta and hemiarch with a 24 mm hemashield graft by Dr. Nevada Crane. The patient apparently had some problems with postoperative coagulopathy but this resolved and he had an otherwise uncomplicated postoperative course. He was seen a few weeks later by Dr. Kittie Plater from cardiology for atrial flutter which was treated with medication and resolved. He said that he has not had any further followup CT scans until recently. He was seen by his primary physician and treated for possible pneumonia in November of 2012. He said that he recently had a followup chest x-ray to be sure the pneumonia had resolved and this showed widening of the mediastinum. 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 The patient was subsequently referred by Dr. Laneta Simmers of thoracic surgery to [facility] for a stent procedure of the descending aorta along with bypass surgery. The patient had an innominate artery bypass grafting repair done and an aortic repair to the left common carotid with associated stenting procedure. The patient apparently was just discharged from [facility] on 10/26/2011. The patient's had prior pneumonias noted. The patient had  overnight increasing cough and congestion. The patient presents to [facility] emergency room 10/27/11 with HCAP PNA. The patient was initially offered transfer to [facility] the patient declined this and wish to be admitted to [facility]   Critical care was called to admit this patient due to his  recent operative status.  Lines / Drains: none  Cultures: BCx2 3/16 Sputum c/s 3/16 UC 3/16   Antibiotics: Zosyn (hcap) 3/16 Vanco (hcap)_ 3/16  Tests / Events: None  Subjective:  Sitting up in bed, eating well  Decreased dyspnea and cough   Vital Signs: Temp:  [98.2 F (36.8 C)-99.2 F (37.3 C)] 98.5 F (36.9 C) (03/17 1203) Pulse Rate:  [100-127] 100  (03/17 1100) Resp:  [13-22] 15  (03/17 1100) BP: (92-142)/(57-89) 113/68 mmHg (03/17 1100) SpO2:  [91 %-98 %] 94 % (03/17 1100) Weight:  [74.2 kg (163 lb 9.3 oz)-77.747 kg (171 lb 6.4 oz)] 74.2 kg (163 lb 9.3 oz) (03/17 0600) I/O last 3 completed shifts: In: 750 [I.V.:450; IV Piggyback:300] Out: 900 [Urine:900]  Physical Examination: General:  NAD  Neuro:  Awake and alert moves all fours no focal deficits   HEENT:  Dry mucous membranes no other lesions seen Neck:  Neck supple no thyromegaly   Cardiovascular:  Chest wall shows recent surgical scars with staples in place both along the neck  and median sternotomy Lungs:  Distant breath sounds, Coarse BS  Abdomen:  Soft nontender bowel sounds active no organomegaly Musculoskeletal:  Full range of motion no joint deformity Skin:  Clear      Labs and Imaging:  Reviewed.  Please refer to the Assessment and Plan section for relevant results. Portable Chest Xray In Am  10/28/2011  *RADIOLOGY REPORT*  Clinical Data: Hypertension, coronary bypass, pneumonia, thoracic stent for prior dissection  PORTABLE CHEST - 1 VIEW  Comparison: 10/27/2011  Findings: Thoracic stent graft noted.  Postop changes from median sternotomy.  Skin staples still evident.  Bibasilar streaky atelectasis noted.  Left lower lobe retrocardiac atelectasis / consolidation persist.  Trace pleural effusions suspected.  No pneumothorax.  Minimal interval change.  IMPRESSION: Basilar atelectasis and left lower lobe retrocardiac consolidation. No interval change.  Stable postoperative findings.  Original Report  Authenticated By: Judie Petit. Ruel Favors, M.D.   Dg Chest Port 1 View  10/27/2011  *RADIOLOGY REPORT*  Clinical Data: Cough, short of breath  PORTABLE CHEST - 1 VIEW  Comparison: No CT 09/11/2011  Findings: Interval stent graft repair of thoracic aortic dissection.  The graft appears well expanded.  There is left lower lobe atelectasis and left retrocardiac density.  There is mild air space disease in the right lower lobe.  No pneumothorax.  IMPRESSION:  1.  Stent graft appears well expanded. 2.  Left lower lobe atelectasis versus infiltrate. 3.  Mild air space disease in the right lower lobe representing infection or edema.  Original Report Authenticated By: Genevive Bi, M.D.    Lab 10/28/11 0440 10/27/11 2139 10/27/11 1459  NA 137 138 137  K 3.8 3.7 --  CL 99 98 96  CO2 29 30 30   GLUCOSE 94 156* 101*  BUN 11 11 14   CREATININE 0.73 0.69 0.73  CALCIUM 8.3* 8.8 9.1  MG -- -- --  PHOS -- -- --    Lab 10/28/11 0440 10/27/11 2139 10/27/11 1459  HGB 7.6* 8.2* 8.0*  HCT 23.5* 25.7* 25.0*  WBC 10.1 10.6* 10.7*  PLT 337 309 336    Assessment and Plan: Patient Active Hospital Problem List: HCAP (healthcare-associated pneumonia) (10/27/2011)   Assessment: Bilateral lower lobe infiltrates compatible with atelectasis versus airspace disease compatible with hospital acquired pneumonia , RPt cxr improved 3/17- favor atx over pna, also note  Low pct   Plan:  Cont Zosyn and vancomycin  Follow cx data & scale down abx quickly  IS   CORONARY ARTERY DISEASE (04/03/2009)   Assessment: Status post bypass surgery    Plan: Stable to this time without evidence of active ongoing ischemia    CARDIOMYOPATHY, 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)   Assessment: 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    Plan: Monitor volume status , dc IVFs   Atrial flutter (05/11/2009)   Assessment: History of atrial flutter but currently now in sinus rhythm    Plan: Monitor on telemetry   Dissecting aortic aneurysm (any  part), 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/2011)   Assessment: Status post repair with stenting procedure of ascending aortic aneurysm and bypass surgery    Plan:  Monitor closely   H/O ascending aorta repair (10/27/2011)   Assessment: As above    Plan: As above   Hypoxemia (10/27/2011)   Assessment: Due to bilateral pulmonary infiltrates    Plan: Titrate oxygen,  flutter valve and incentive spirometry pulm hygiene   Post op anemia Assessment:  Need records from [facility] Plan Observation for now, ?? Transfuse if Hgb < 8.0 Cont Fe supp   Lab 10/28/11 0440 10/27/11 2139 10/27/11 1459  HGB 7.6* 8.2* 8.0*  HCT 23.5* 25.7* 25.0*  WBC 10.1 10.6* 10.7*  PLT 337 309 336    Best practices / Disposition: -->ICU status under PCCM, can transfer to tele in 24 h if stable, as fu appt at [facility] on 3/24 -->full code -->Heparin for DVT Px -->Protonix for GI Px --->diet  2 gm Na -->family updated at bedside  Cyril Mourning MD. FCCP. [facility] Pager [phone] If no response call [phone]

## 2011-10-29 ENCOUNTER — Inpatient Hospital Stay (HOSPITAL_COMMUNITY): Payer: Medicare Other

## 2011-10-29 ENCOUNTER — Telehealth: Payer: Self-pay | Admitting: Family Medicine

## 2011-10-29 DIAGNOSIS — J9819 Other pulmonary collapse: Secondary | ICD-10-CM

## 2011-10-29 DIAGNOSIS — J189 Pneumonia, unspecified organism: Secondary | ICD-10-CM

## 2011-10-29 LAB — CBC
HCT: 25.1 % — ABNORMAL LOW (ref 39.0–52.0)
Hemoglobin: 8 g/dL — ABNORMAL LOW (ref 13.0–17.0)
MCV: 86.6 fL (ref 78.0–100.0)
WBC: 9.5 10*3/uL (ref 4.0–10.5)

## 2011-10-29 LAB — URINE CULTURE
Colony Count: NO GROWTH
Culture: NO GROWTH

## 2011-10-29 LAB — BASIC METABOLIC PANEL
BUN: 9 mg/dL (ref 6–23)
CO2: 31 mEq/L (ref 19–32)
Chloride: 102 mEq/L (ref 96–112)
Creatinine, Ser: 0.75 mg/dL (ref 0.50–1.35)
Glucose, Bld: 99 mg/dL (ref 70–99)
Potassium: 3.9 mEq/L (ref 3.5–5.1)

## 2011-10-29 MED ORDER — ALBUTEROL SULFATE (5 MG/ML) 0.5% IN NEBU
2.5000 mg | INHALATION_SOLUTION | Freq: Two times a day (BID) | RESPIRATORY_TRACT | Status: DC
  Administered 2011-10-29 – 2011-10-31 (×4): 2.5 mg via RESPIRATORY_TRACT
  Filled 2011-10-29 (×4): qty 0.5

## 2011-10-29 MED ORDER — METOPROLOL TARTRATE 50 MG PO TABS
50.0000 mg | ORAL_TABLET | Freq: Two times a day (BID) | ORAL | Status: DC
  Administered 2011-10-29 – 2011-10-31 (×5): 50 mg via ORAL
  Filled 2011-10-29 (×5): qty 1

## 2011-10-29 MED ORDER — ALBUTEROL SULFATE (5 MG/ML) 0.5% IN NEBU: 2.5000 mg | INHALATION_SOLUTION | RESPIRATORY_TRACT | Status: DC | PRN

## 2011-10-29 NOTE — Telephone Encounter (Signed)
Noted, patient admitted

## 2011-10-29 NOTE — Progress Notes (Signed)
UR Completed.  Ray Fowler [phone] 10/29/2011  

## 2011-10-29 NOTE — Progress Notes (Signed)
Ray Fowler is a 71 y.o. male former smoker admitted on 10/27/2011 with increased cough, chest congestion with concern for HCAP vs Atelectasis. He had repair of type A thoracic aortic dissection August 2010.  He was treated for PNA in November 2012.  He had CABG and an aortic repair to the left common carotid with associated stenting procedure at [facility] recently, and d/c from there 3/15. PMHx HTN, Hyperlipidemia, A flutter, Prostate cancer, Carotid bruit  Lines: Condom cath 3/16>>>  Cultures: MRSA screen 3/16>>negative Urine 3/17>> Blood 3/17>>  Antibiotics: Vancomycin 3/16>> Zosyn 3/16>>  Tests/events:  SUBJECTIVE: Still has cough, but less sputum.  Mild soreness at surgery site.  Denies abdominal pain.  OBJECTIVE:  Blood pressure 135/78, pulse 116, temperature 98.3 F (36.8 C), temperature source Oral, resp. rate 21, height 6\' 1"  (1.854 m), weight 157 lb 3 oz (71.3 kg), SpO2 95.00%. Wt Readings from Last 3 Encounters:  10/29/11 157 lb 3 oz (71.3 kg)  09/17/11 165 lb (74.844 kg)  09/10/11 169 lb 12 oz (76.998 kg)   Body mass index is 20.74 kg/(m^2).  I/O last 3 completed shifts: In: 2510 [P.O.:900; I.V.:660; IV Piggyback:950] Out: 3350 [Urine:3350]  Physical Exam: General - no distress HEENT - wears glasses, no sinus tenderness Cardiac - s1s2 tachycardic, regular, no murmur Chest - surgical wound site clean, decreased breath sounds Lt base, no wheeze Abd - soft, nontender Ext - no edema GU - condom catheter in place Neuro - normal strength Psych - normal mood, behavior  CBC    Component Value Date/Time   WBC 9.5 10/29/2011 0521   RBC 2.90* 10/29/2011 0521   HGB 8.0* 10/29/2011 0521   HCT 25.1* 10/29/2011 0521   PLT 358 10/29/2011 0521   MCV 86.6 10/29/2011 0521   MCH 27.6 10/29/2011 0521   MCHC 31.9 10/29/2011 0521   RDW 15.3 10/29/2011 0521   LYMPHSABS 1.4 10/27/2011 1459   MONOABS 1.4* 10/27/2011 1459   EOSABS 0.3 10/27/2011 1459   BASOSABS 0.1 10/27/2011 1459      BMET    Component Value Date/Time   NA 140 10/29/2011 0521   K 3.9 10/29/2011 0521   CL 102 10/29/2011 0521   CO2 31 10/29/2011 0521   GLUCOSE 99 10/29/2011 0521   BUN 9 10/29/2011 0521   CREATININE 0.75 10/29/2011 0521   CALCIUM 8.7 10/29/2011 0521   GFRNONAA >90 10/29/2011 0521   GFRAA >90 10/29/2011 0521    Lab Results  Component Value Date   ALT 19 10/27/2011   AST 29 10/27/2011   ALKPHOS 53 10/27/2011   BILITOT 0.7 10/27/2011    Dg Chest Port 1 View  10/29/2011  *RADIOLOGY REPORT*  Clinical Data: Pneumonia  PORTABLE CHEST - 1 VIEW  Comparison: 10/28/2011  Findings: Patchy left lower lobe opacity, atelectasis versus pneumonia, with associated small left pleural effusion.  Right lower lobe atelectasis.  No pneumothorax.  Stable cardiomediastinal silhouette.  Aortic stent.  Sternotomy wires.  Mediastinal clips.  Overlying skin staples.  IMPRESSION: Patchy left lower lobe opacity, atelectasis versus pneumonia, with associated small left pleural effusion.  Stable cardiomediastinal silhouette.  Original Report Authenticated By: Charline Bills, M.D.   Portable Chest Xray In Am  10/28/2011  *RADIOLOGY REPORT*  Clinical Data: Hypertension, coronary bypass, pneumonia, thoracic stent for prior dissection  PORTABLE CHEST - 1 VIEW  Comparison: 10/27/2011  Findings: Thoracic stent graft noted.  Postop changes from median sternotomy.  Skin staples still evident.  Bibasilar streaky atelectasis noted.  Left lower lobe  retrocardiac atelectasis / consolidation persist.  Trace pleural effusions suspected.  No pneumothorax.  Minimal interval change.  IMPRESSION: Basilar atelectasis and left lower lobe retrocardiac consolidation. No interval change.  Stable postoperative findings.  Original Report Authenticated By: Judie Petit. Ruel Favors, M.D.   Dg Chest Port 1 View  10/27/2011  *RADIOLOGY REPORT*  Clinical Data: Cough, short of breath  PORTABLE CHEST - 1 VIEW  Comparison: No CT 09/11/2011  Findings: Interval stent  graft repair of thoracic aortic dissection.  The graft appears well expanded.  There is left lower lobe atelectasis and left retrocardiac density.  There is mild air space disease in the right lower lobe.  No pneumothorax.  IMPRESSION:  1.  Stent graft appears well expanded. 2.  Left lower lobe atelectasis versus infiltrate. 3.  Mild air space disease in the right lower lobe representing infection or edema.  Original Report Authenticated By: Genevive Bi, M.D.    ASSESSMENT/PLAN:  Pulmonary infiltrates with atelectasis +/- HCAP -D3/x vancomycin, zosyn -continue bronchial hygiene -f/u PA/Lateral CXR 3/19 -titrate oxygen to keep SpO2 > 92% -continue scheduled neb tx for now  S/p Thoracic aorta repair, CABG with Hx of HTN/hyperlipidemia -will need f/u at [facility] later this month -followed by Dr. Mariah Milling with [facility] cardiology as outpt -continue lasix, ASA, lipitor, fenofibrate -will increase lopressor  Sinus tachycardia with hx of A flutter -monitor on tele -increase lopressor  Anemia -f/u CBC -transfuse for Hb < 8 given recent cardiac interventions -continue FeSO4   Hx of prostate cancer -condom catheter  Hypokalemia -continue KCL while getting scheduled lasix  Disposition -Activity as tolerated -Diet>>Low NA -SUP with protonix -DVT proph with SQ heparin -Transfer to telemetry>>keep on PCCM service as likely to d/c home soon  Naziyah Tieszen Pager:  [phone] 10/29/2011, 9:23 AM

## 2011-10-29 NOTE — Telephone Encounter (Signed)
Triage Record Num: [id number] Operator: Charlton Haws Patient Name: Ray Fowler Call Date & Time: 10/27/2011 12:47:19PM Patient Phone: [phone] PCP: Crawford Givens Patient Gender: Male PCP Fax : Patient DOB: 11/04/1940 Practice Name: [facility] Reason for Call: Caller: Doylene Bode; PCP: Crawford Givens Clelia Croft); CB#: [phone]; Call regarding Difficulty Breathing; can't talk and breathe at same time, CABG, aortic aneurysm, stents w/in last week, D/C from [facility] 10/26/11; Denies Chest pain; All emergent sxs per Breathing Problems Protocol r/o except worsening breathing problems and known cardiac condition & treatment not available.Disp: EMS/911 Protocol(s) Used: Breathing Problems Recommended Outcome per Protocol: Activate EMS 911 Reason for Outcome: Worsening breathing problems AND known cardiac or respiratory condition (coronary artery disease, angina, heart failure, asthma, chronic bronchitis, COPD) NOT responding to treatment OR treatment not available. Care Advice: ~ Do not give the patient anything to eat or drink. ~ IMMEDIATE ACTION Write down provider's name. List or place the following in a bag for transport with the patient: current prescription and/or nonprescription medications; alternative treatments, therapies and medications; and street drugs. ~ 10/27/2011 12:58:39PM Page 1 of 1 [email or web address]

## 2011-10-30 ENCOUNTER — Inpatient Hospital Stay (HOSPITAL_COMMUNITY): Payer: Medicare Other

## 2011-10-30 DIAGNOSIS — J9819 Other pulmonary collapse: Secondary | ICD-10-CM

## 2011-10-30 DIAGNOSIS — J189 Pneumonia, unspecified organism: Secondary | ICD-10-CM

## 2011-10-30 LAB — BASIC METABOLIC PANEL
BUN: 7 mg/dL (ref 6–23)
CO2: 28 mEq/L (ref 19–32)
Calcium: 9.2 mg/dL (ref 8.4–10.5)
Chloride: 99 mEq/L (ref 96–112)
Creatinine, Ser: 0.72 mg/dL (ref 0.50–1.35)
Glucose, Bld: 111 mg/dL — ABNORMAL HIGH (ref 70–99)

## 2011-10-30 LAB — CBC
HCT: 27.9 % — ABNORMAL LOW (ref 39.0–52.0)
MCH: 27.5 pg (ref 26.0–34.0)
MCV: 87.2 fL (ref 78.0–100.0)
Platelets: 463 10*3/uL — ABNORMAL HIGH (ref 150–400)
RDW: 15.4 % (ref 11.5–15.5)
WBC: 11.1 10*3/uL — ABNORMAL HIGH (ref 4.0–10.5)

## 2011-10-30 MED ORDER — LEVOFLOXACIN 750 MG PO TABS
750.0000 mg | ORAL_TABLET | ORAL | Status: DC
  Administered 2011-10-30 – 2011-10-31 (×2): 750 mg via ORAL
  Filled 2011-10-30 (×2): qty 1

## 2011-10-30 MED ORDER — POTASSIUM CHLORIDE CRYS ER 20 MEQ PO TBCR
40.0000 meq | EXTENDED_RELEASE_TABLET | Freq: Once | ORAL | Status: AC
  Administered 2011-10-30: 40 meq via ORAL
  Filled 2011-10-30: qty 2

## 2011-10-30 NOTE — Progress Notes (Signed)
Ray Fowler is a 71 y.o. male former smoker admitted on 10/27/2011 with increased cough, chest congestion with concern for HCAP vs Atelectasis. He had repair of type A thoracic aortic dissection August 2010.  He was treated for PNA in November 2012.  He had CABG and an aortic repair to the left common carotid with associated stenting procedure at [facility] recently, and d/c from there 3/15. PMHx HTN, Hyperlipidemia, A flutter, Prostate cancer, Carotid bruit  Lines: Condom cath 3/16>>>  Cultures: MRSA screen 3/16>>negative Urine 3/17>> Blood 3/17>>  Antibiotics: Vancomycin 3/16>>3/19 Zosyn 3/16>>3/19 Levaquin 3/19 >>  Tests/events:  SUBJECTIVE: Trying to cough up sputum, overall feels better, has walked today.  OBJECTIVE:  Blood pressure 134/81, pulse 105, temperature 98.6 F (37 C), temperature source Oral, resp. rate 20, height 6\' 1"  (1.854 m), weight 71.3 kg (157 lb 3 oz), SpO2 92.00%. Wt Readings from Last 3 Encounters:  10/29/11 71.3 kg (157 lb 3 oz)  09/17/11 74.844 kg (165 lb)  09/10/11 76.998 kg (169 lb 12 oz)   Body mass index is 20.74 kg/(m^2).  I/O last 3 completed shifts: In: 1570 [P.O.:1140; I.V.:80; IV Piggyback:350] Out: 3700 [Urine:3700]  Physical Exam: Gen: no acute distress HEENT: NCAT, OP clear PULM: Insp crackles R base > L base CV: RRR, no mgr AB: BS+, soft, nontender Ext: warm, no edema  CBC    Component Value Date/Time   WBC 11.1* 10/30/2011 0500   RBC 3.20* 10/30/2011 0500   HGB 8.8* 10/30/2011 0500   HCT 27.9* 10/30/2011 0500   PLT 463* 10/30/2011 0500   MCV 87.2 10/30/2011 0500   MCH 27.5 10/30/2011 0500   MCHC 31.5 10/30/2011 0500   RDW 15.4 10/30/2011 0500   LYMPHSABS 1.4 10/27/2011 1459   MONOABS 1.4* 10/27/2011 1459   EOSABS 0.3 10/27/2011 1459   BASOSABS 0.1 10/27/2011 1459    BMET    Component Value Date/Time   NA 139 10/30/2011 0500   K 3.4* 10/30/2011 0500   CL 99 10/30/2011 0500   CO2 28 10/30/2011 0500   GLUCOSE 111* 10/30/2011  0500   BUN 7 10/30/2011 0500   CREATININE 0.72 10/30/2011 0500   CALCIUM 9.2 10/30/2011 0500   GFRNONAA >90 10/30/2011 0500   GFRAA >90 10/30/2011 0500    Lab Results  Component Value Date   ALT 19 10/27/2011   AST 29 10/27/2011   ALKPHOS 53 10/27/2011   BILITOT 0.7 10/27/2011    Dg Chest 2 View  10/30/2011  *RADIOLOGY REPORT*  Clinical Data: Follow up pulmonary infiltrates  CHEST - 2 VIEW  Comparison: 10/29/2011  Findings: Cardiomediastinal silhouette is stable.  There is small left pleural effusion with left basilar atelectasis or infiltrate. Status post median sternotomy again noted.  Stable aortic stent. Skin staples upper chest wall are stable.  No pulmonary edema. Bony thorax is stable.  IMPRESSION: Small left pleural effusion with left basilar atelectasis or infiltrate.  Status post median sternotomy did.  Stable aortic stent.  No pulmonary edema.  Original Report Authenticated By: Natasha Mead, M.D.   Dg Chest Port 1 View  10/29/2011  *RADIOLOGY REPORT*  Clinical Data: Pneumonia  PORTABLE CHEST - 1 VIEW  Comparison: 10/28/2011  Findings: Patchy left lower lobe opacity, atelectasis versus pneumonia, with associated small left pleural effusion.  Right lower lobe atelectasis.  No pneumothorax.  Stable cardiomediastinal silhouette.  Aortic stent.  Sternotomy wires.  Mediastinal clips.  Overlying skin staples.  IMPRESSION: Patchy left lower lobe opacity, atelectasis versus pneumonia, with associated  small left pleural effusion.  Stable cardiomediastinal silhouette.  Original Report Authenticated By: Charline Bills, M.D.    ASSESSMENT/PLAN:  Pulmonary infiltrates with atelectasis +/- HCAP -D/C vancomycin, zosyn; start levaquin -continue bronchial hygiene -PA/Lateral CXR 3/19 improved -titrate oxygen to keep SpO2 > 92% -continue scheduled neb tx for now  S/p Thoracic aorta repair, CABG with Hx of HTN/hyperlipidemia -will need f/u at [facility] later this month -followed by Dr. Mariah Milling with [facility]  cardiology as outpt -continue lasix, ASA, lipitor, fenofibrate -maintain lopressor  Sinus tachycardia with hx of A flutter -monitor on tele -maintain lopressor at dose  Anemia -f/u CBC -transfuse for Hb < 8 given recent cardiac interventions -continue FeSO4   Hx of prostate cancer -condom catheter  Hypokalemia -continue KCL while getting scheduled lasix (gave extra dose 3/19)  Disposition -Activity as tolerated -Diet>>Low NA -SUP with protonix -DVT proph with SQ heparin -Transfer to telemetry>>keep on PCCM service as likely to d/c home soon   Yolonda Kida PCCM Pager: [phone] If no response, call [phone]

## 2011-10-31 LAB — CBC
HCT: 27 % — ABNORMAL LOW (ref 39.0–52.0)
Hemoglobin: 8.6 g/dL — ABNORMAL LOW (ref 13.0–17.0)
RDW: 15.5 % (ref 11.5–15.5)
WBC: 9.3 10*3/uL (ref 4.0–10.5)

## 2011-10-31 LAB — BASIC METABOLIC PANEL
Chloride: 99 mEq/L (ref 96–112)
GFR calc Af Amer: >90 mL/min (ref >90)
Potassium: 3.6 mEq/L (ref 3.5–5.1)

## 2011-10-31 MED ORDER — SENNA-DOCUSATE SODIUM 8.6-50 MG PO TABS: 2.0000 | ORAL_TABLET | Freq: Two times a day (BID) | ORAL | Status: DC | PRN

## 2011-10-31 MED ORDER — LEVOFLOXACIN 750 MG PO TABS: 750.0000 mg | ORAL_TABLET | ORAL | Status: AC

## 2011-10-31 MED ORDER — ALBUTEROL SULFATE HFA 108 (90 BASE) MCG/ACT IN AERS: 2.0000 | INHALATION_SPRAY | RESPIRATORY_TRACT | Status: DC | PRN

## 2011-10-31 NOTE — Discharge Summary (Signed)
Ray Fowler [place] PCCM Pager: [phone] If no response, call [phone]  

## 2011-10-31 NOTE — Discharge Summary (Signed)
Physician Discharge Summary  Patient ID: Ray Fowler MRN: [id number] DOB/AGE: 12-27-1940 71 y.o.  Admit date: 10/27/2011 Discharge date: 10/31/2011    Discharge Diagnoses:  Principal Problem:  *HCAP (healthcare-associated pneumonia) Active Problems:  CORONARY ARTERY DISEASE  CARDIOMYOPATHY, SECONDARY  Atrial flutter  Dissecting aortic aneurysm (any part), thoracic  H/O ascending aorta repair  Hypoxemia  Postoperative anemia due to acute blood loss    Brief Summary: Ray Fowler is a 71 y.o. y/o male male former smoker admitted on 10/27/2011 with increased cough, chest congestion with concern for HCAP vs Atelectasis.  He had repair of type A thoracic aortic dissection August 2010. He was treated for PNA in November 2012. He had CABG and an aortic repair to the left common carotid with associated stenting procedure at [facility] recently, and d/c from there 3/15.  HCAP - In setting recent Aneurysm repair.  Initially rx with vanc/zosyn, pulmonary hygiene, close f/u CXR.  Cultures neg. Will cont Levaquin for total 10days abx. CXR improving. On RA. Will f/u as outpt with f/u CXR.   S/p Thoracic aorta repair, CABG - will need f/u at [facility] later this month as prev scheduled. Continue lasix, ASA, lipitor, fenofibrate and maintain lopressor.  Cardiology f/u as needed.   Sinus tachycardia with hx of A flutter - resolved. Monitored on tele. Cont lopressor. Now NSR.   Anemia  - cbc's were followed closely.  No transfusions needed this admit.  Cont FeSO4, F/u with PCP.   Hypokalemia - Resolved. Continue KCL while getting scheduled lasix (gave extra dose 3/19)   Cultures:  MRSA screen 3/16>>negative  Urine 3/17>> neg Blood 3/17>> ngtd>>>  Antibiotics:  Vancomycin 3/16>>3/19  Zosyn 3/16>>3/19  Levaquin 3/19 >> planned 3/26   Discharge Exam: General: chronically ill appearing male, NAD in bed Neuro: awake and alert, appropriate, MAE CV: s1s2 rrr, no m/r/g, surgical incisions  healing with staples PULM: resps even non labored on RA, R>L scattered ronchi GI: abd soft +bs Extremities:  Warm and dry, no edema     Discharge Labs  BMET  Lab 10/31/11 0527 10/30/11 0500 10/29/11 0521 10/28/11 0440 10/27/11 2139  NA 137 139 140 137 138  K 3.6 3.4* -- -- --  CL 99 99 102 99 98  CO2 29 28 31 29 30   GLUCOSE 94 111* 99 94 156*  BUN 8 7 9 11 11   CREATININE 0.73 0.72 0.75 0.73 0.69  CALCIUM 9.1 9.2 8.7 8.3* 8.8  MG -- -- -- -- --  PHOS -- -- -- -- --     CBC   Lab 10/31/11 0527 10/30/11 0500 10/29/11 0521  HGB 8.6* 8.8* 8.0*  HCT 27.0* 27.9* 25.1*  WBC 9.3 11.1* 9.5  PLT 473* 463* 358     Discharge Orders    Future Appointments: Provider: Department: Dept Phone: Center:   11/12/2011 9:30 AM Joaquim Nam, MD [facility] [phone] [id number]   03/11/2012 10:00 AM [facility] [phone] [facility]       Follow-up Information    Follow up with Crawford Givens, MD on 11/12/2011. (9:30am )    Contact information:   [address] [place] [address] [phone]       Follow up with Max Fickle, MD on 11/13/2011. (9:30am with Chest X-Ray)    Contact information:   [address] [place] [address] [place] [address] [phone]       Follow up with Dr. Italy Hughes on 11/08/2011. (as previously  scheduled )    Contact information:   Thoracic Surgery  [facility] [address] [place], [place] [address] [phone]           Phillips, G[place]lette  Home Medication Instructions [id number]:[id number]   Printed on:10/31/11 1048  Medication Information                    aspirin 81 MG tablet 81 mg. Take one tablet every other day.           fluticasone (FLOVENT HFA) 110 MCG/ACT inhaler Inhale 1 puff into the lungs as needed.             dextromethorphan-guaiFENesin (MUCINEX DM) 30-600 MG per 12 hr tablet Take 1 tablet by mouth as needed.             fenofibrate (TRICOR) 145 MG tablet Take one by mouth every other  day           fluticasone (FLONASE) 50 MCG/ACT nasal spray USE TWO SPRAYS EACH NOSTRIL EVERY DAY           metoprolol tartrate (LOPRESSOR) 25 MG tablet Take 25 mg by mouth 2 (two) times daily.           ferrous sulfate 325 (65 FE) MG tablet Take 325 mg by mouth 2 (two) times daily with a meal.           potassium chloride (K-DUR) 10 MEQ tablet Take 10 mEq by mouth 2 (two) times daily.           furosemide (LASIX) 20 MG tablet Take 20 mg by mouth 2 (two) times daily.           oxyCODONE (OXY IR/ROXICODONE) 5 MG immediate release tablet Take 5 mg by mouth every 6 (six) hours as needed. For pain           Acetaminophen 650 MG TABS Take 1,300 mg by mouth every 4 (four) hours as needed. For pain           atorvastatin (LIPITOR) 10 MG tablet Take 10 mg by mouth at bedtime.           Multiple Vitamin (MULITIVITAMIN WITH MINERALS) TABS Take 1 tablet by mouth daily.           levofloxacin (LEVAQUIN) 750 MG tablet Take 1 tablet (750 mg total) by mouth daily.           sennosides-docusate sodium (SENOKOT-S) 8.6-50 MG tablet Take 2 tablets by mouth 2 (two) times daily as needed for constipation.           albuterol (PROVENTIL HFA;VENTOLIN HFA) 108 (90 BASE) MCG/ACT inhaler Inhale 2 puffs into the lungs every 4 (four) hours as needed for wheezing or shortness of breath.              Disposition: Home  Discharged Condition: Ray Fowler has met maximum benefit of inpatient care and is medically stable and cleared for discharge.  Patient is pending follow up as above.      Time spent on disposition:  Greater than 35 minutes.   SignedDanford Bad, NP 10/31/2011  10:45 AM Pager: [phone]  *Care during the described time interval was provided by me and/or other providers on the critical care team. I have reviewed this patient's available data, including medical history, events of note, physical examination and test results as part of my evaluation.

## 2011-11-02 LAB — CULTURE, BLOOD (ROUTINE X 2)
Culture  Setup Time: 201303162007
Culture: NO GROWTH

## 2011-11-07 ENCOUNTER — Encounter: Payer: Self-pay | Admitting: Cardiovascular Disease

## 2011-11-08 ENCOUNTER — Ambulatory Visit: Payer: Medicare Other | Admitting: Family Medicine

## 2011-11-12 ENCOUNTER — Ambulatory Visit: Payer: Medicare Other | Admitting: Family Medicine

## 2011-11-12 ENCOUNTER — Ambulatory Visit (INDEPENDENT_AMBULATORY_CARE_PROVIDER_SITE_OTHER): Payer: Medicare Other | Admitting: Family Medicine

## 2011-11-12 ENCOUNTER — Encounter: Payer: Self-pay | Admitting: Family Medicine

## 2011-11-12 ENCOUNTER — Telehealth: Payer: Self-pay

## 2011-11-12 VITALS — BP 146/88 | HR 51 | Temp 98.4°F | Wt 154.0 lb

## 2011-11-12 DIAGNOSIS — I10 Essential (primary) hypertension: Secondary | ICD-10-CM

## 2011-11-12 DIAGNOSIS — I2510 Atherosclerotic heart disease of native coronary artery without angina pectoris: Secondary | ICD-10-CM

## 2011-11-12 DIAGNOSIS — D649 Anemia, unspecified: Secondary | ICD-10-CM

## 2011-11-12 DIAGNOSIS — I7100 Dissection of unspecified site of aorta: Secondary | ICD-10-CM

## 2011-11-12 LAB — CBC WITH DIFFERENTIAL/PLATELET
Eosinophils Relative: 5.5 % — ABNORMAL HIGH (ref 0.0–5.0)
HCT: 31.5 % — ABNORMAL LOW (ref 39.0–52.0)
Hemoglobin: 9.9 g/dL — ABNORMAL LOW (ref 13.0–17.0)
Lymphs Abs: 0.8 10*3/uL (ref 0.7–4.0)
MCV: 85.7 fl (ref 78.0–100.0)
Monocytes Absolute: 0.9 10*3/uL (ref 0.1–1.0)
Neutro Abs: 3.9 10*3/uL (ref 1.4–7.7)
Platelets: 328 10*3/uL (ref 150.0–400.0)
WBC: 5.9 10*3/uL (ref 4.5–10.5)

## 2011-11-12 LAB — BASIC METABOLIC PANEL
BUN: 12 mg/dL (ref 6–23)
Chloride: 100 mEq/L (ref 96–112)
Glucose, Bld: 128 mg/dL — ABNORMAL HIGH (ref 70–99)
Potassium: 3.6 mEq/L (ref 3.5–5.1)
Sodium: 140 mEq/L (ref 135–145)

## 2011-11-12 NOTE — Progress Notes (Signed)
Seen at [facility] for repeat aortic surgery.  Was discharged, then went back to [facility] for PNA.  Off avelox now.  No fevers.   Some cough, but better.  Better if sleeping elevated, dry cough.  His sleep is interrupted.  He naps during the day.  No edema.    H/o GERD and istaking reglan 5mg  bid.  If not better, then will need GI eval.  We talked about this at the OV.    Per patient, his f/u CT will be done by [facility] vascular surgery in 04/2012.  He's due for repeat labs today and has plan for f/u CXR with pulmonary.    PMH and SH reviewed  ROS: See HPI, otherwise noncontributory.  Meds, vitals, and allergies reviewed.   nad ncat Mmm rrr Midline sternotomy healing No inc in wob Lungs w/o wheeze for sig dec in BS Ext w/o edema

## 2011-11-12 NOTE — Patient Instructions (Signed)
We'll contact you with your lab report. Call Dr. Russella Dar if the swallowing doesn't get better.  Take care.   See Shirlee Limerick about your referral before you leave today (to see Dr. Mariah Milling).

## 2011-11-12 NOTE — Telephone Encounter (Signed)
Pt was seen today by Dr Para March and pts wife called back and said pt is taking Metoclopramide 10 mg one tab by mouth twice a day. Pt is not taking Metoclopramide 5 mg twice a day. Pt uses [facility] and pt can be reached at [phone].

## 2011-11-13 ENCOUNTER — Ambulatory Visit (INDEPENDENT_AMBULATORY_CARE_PROVIDER_SITE_OTHER): Payer: Medicare Other | Admitting: Pulmonary Disease

## 2011-11-13 ENCOUNTER — Ambulatory Visit
Admission: RE | Admit: 2011-11-13 | Discharge: 2011-11-13 | Disposition: A | Discharge status: Home / Self Care | Payer: 59 | Source: Ambulatory Visit | Attending: Pulmonary Disease | Admitting: Pulmonary Disease

## 2011-11-13 ENCOUNTER — Ambulatory Visit (INDEPENDENT_AMBULATORY_CARE_PROVIDER_SITE_OTHER)
Admission: RE | Admit: 2011-11-13 | Discharge: 2011-11-13 | Disposition: A | Discharge status: Home / Self Care | Payer: 59 | Source: Ambulatory Visit | Attending: Pulmonary Disease | Admitting: Pulmonary Disease

## 2011-11-13 ENCOUNTER — Other Ambulatory Visit: Payer: Self-pay | Admitting: Pulmonary Disease

## 2011-11-13 ENCOUNTER — Encounter: Payer: Self-pay | Admitting: Pulmonary Disease

## 2011-11-13 VITALS — BP 110/74 | HR 100 | Temp 97.9°F | Ht 72.0 in | Wt 156.0 lb

## 2011-11-13 DIAGNOSIS — R05 Cough: Secondary | ICD-10-CM

## 2011-11-13 DIAGNOSIS — J189 Pneumonia, unspecified organism: Secondary | ICD-10-CM | POA: Insufficient documentation

## 2011-11-13 DIAGNOSIS — R059 Cough, unspecified: Secondary | ICD-10-CM | POA: Insufficient documentation

## 2011-11-13 MED ORDER — METOCLOPRAMIDE HCL 10 MG PO TABS: 10.0000 mg | ORAL_TABLET | Freq: Two times a day (BID) | ORAL | Status: DC

## 2011-11-13 NOTE — Telephone Encounter (Signed)
Wife advised. 

## 2011-11-13 NOTE — Progress Notes (Signed)
Subjective:    Patient ID: Ray Fowler, male    DOB: Nov 11, 1940, 71 y.o.   MRN: [id number]  HPI  71 y/o male known to me after a 10/2011 admission to [facility] for HCAP.  He had recently undergone a vascular surgery procedure (innominate artery bypass graft, aortic repair, left common carotid stenting) but then developed pneumonia in the left lower lobe and was admitted to [facility] for the same.  He was hospitalized from 3/16 through 3/20.  At the time of his hospital discharge he was ambulating without oxygen and his infiltrate had improved.  Pulmonary toilette was encouraged.  He was given a course of Levaquin to complete as an outpatient.  He states that he is still coughing up scant sputum, and still doesn't quite feel like himself yet.  His energy and appetite are gradually improving.    He states that he was put on Flovent several years ago for intermittent wheezing and cough that would develop in allergy season.  He doesn't need it year round and states that prior to the surgery and pneumonia he had no trouble with breathing, cough, etc.   Past Medical History  Diagnosis Date  . Prostate cancer   . Diverticulosis   . HTN (hypertension)   . Allergic rhinitis   . Aortic dissection     Type A  . Carotid bruit     Right. Carotid u/s 2/11 normal.  . Pulmonary nodule     Stable on CT 2/11  . Hyperlipidemia   . S/P CABG x 1      Family History  Problem Relation Age of Onset  . Heart failure Mother     died age 37  . Hypertension Mother   . Heart disease Mother     CHF  . Kidney failure Father   . Heart disease Father     coronary disease, CABG  . Stroke Father     due to "infection" from [place]  . Kidney disease Father     chronic  . Diabetes Other   . Hyperlipidemia Daughter   . Diabetes Other   . Colon cancer Neg Hx   . Esophageal cancer Neg Hx   . Stomach cancer Neg Hx   . Rectal cancer Neg Hx      History   Social History  . Marital Status: Married    Spouse Name:  N/A    Number of Children: 3  . Years of Education: N/A   Occupational History  . Retired     Therapist, [employer] in [employer]   Social History Main Topics  . Smoking status: Former Smoker -- 1.0 packs/day for 25 years    Types: Cigarettes    Quit date: 07/10/1994  . Smokeless tobacco: Never Used   Comment: quit 1995  . Alcohol Use: 0.5 oz/week    1 drink(s) per week     occassionally wine  . Drug Use: No  . Sexually Active: Yes   Other Topics Concern  . Not on file   Social History Narrative   Enjoys time with grandkids     No Known Allergies   Outpatient Prescriptions Prior to Visit  Medication Sig Dispense Refill  . Acetaminophen 650 MG TABS Take 1,300 mg by mouth every 4 (four) hours as needed. For pain      . albuterol (PROVENTIL HFA;VENTOLIN HFA) 108 (90 BASE) MCG/ACT inhaler Inhale 2 puffs into the lungs every 4 (four) hours as needed for wheezing or shortness of  breath.  1 Inhaler  0  . aspirin 81 MG tablet 81 mg. Take one tablet every other day.      Marland Kitchen atorvastatin (LIPITOR) 10 MG tablet Take 10 mg by mouth at bedtime.      . fenofibrate (TRICOR) 145 MG tablet Take one by mouth every other day      . ferrous sulfate 325 (65 FE) MG tablet Take 325 mg by mouth 2 (two) times daily with a meal.      . fluticasone (FLONASE) 50 MCG/ACT nasal spray USE TWO SPRAYS EACH NOSTRIL EVERY DAY  16 g  6  . fluticasone (FLOVENT HFA) 110 MCG/ACT inhaler Inhale 1 puff into the lungs as needed.        . metoCLOPramide (REGLAN) 10 MG tablet Take 1 tablet (10 mg total) by mouth once.  2 tablet  0  . metoCLOPramide (REGLAN) 10 MG tablet Take 1 tablet (10 mg total) by mouth 2 (two) times daily.      . metoprolol tartrate (LOPRESSOR) 25 MG tablet Take 25 mg by mouth 2 (two) times daily.      . Multiple Vitamin (MULITIVITAMIN WITH MINERALS) TABS Take 1 tablet by mouth daily.      Marland Kitchen oxyCODONE (OXY IR/ROXICODONE) 5 MG immediate release tablet Take 5 mg by mouth every 6 (six) hours as  needed. For pain      . sennosides-docusate sodium (SENOKOT-S) 8.6-50 MG tablet Take 2 tablets by mouth 2 (two) times daily as needed for constipation.          Review of Systems  Constitutional: Positive for activity change, appetite change and fatigue. Negative for fever, chills and unexpected weight change.  HENT: Positive for neck stiffness. Negative for nosebleeds, congestion, sore throat, rhinorrhea, neck pain and postnasal drip.   Respiratory: Positive for cough, chest tightness and shortness of breath. Negative for choking, wheezing and stridor.   Cardiovascular: Positive for chest pain. Negative for palpitations and leg swelling.  Gastrointestinal: Positive for constipation. Negative for nausea, vomiting, abdominal pain and diarrhea.  Genitourinary: Negative for dysuria, flank pain, enuresis and difficulty urinating.  Musculoskeletal: Negative for myalgias, back pain and arthralgias.  Skin: Negative for color change, rash and wound.  Neurological: Negative for dizziness, facial asymmetry, weakness, light-headedness and headaches.       Objective:   Physical Exam  Filed Vitals:   11/13/11 0931  BP: 110/74  Pulse: 100  Temp: 97.9 F (36.6 C)  TempSrc: Oral  Height: 6' (1.829 m)  Weight: 156 lb (70.761 kg)  SpO2: 97%   Gen: well appearing, no acute distress HEENT: NCAT, PERRL, EOMi, OP clear, neck supple without masses PULM: diminished left base, no insp crackles Chest; mediansternotomy and left carotid surgical incision sites are not red, swollen or tender CV: RRR, no mgr, no JVD AB: BS+, soft, nontender, no hsm Ext: warm, no edema, no clubbing, no cyanosis Derm: no rash or skin breakdown Neuro: A&Ox4, CN II-XII intact, strength 5/5 in all 4 extremities  11/14/11 CXR PA/Lat (my read): large aortic stent in place, no left base infiltrate, trace atelectasis left base     Assessment & Plan:   HCAP This occurred 10/27/2011 requiring hospitalization, complicated by  splinting related to his recent aortic surgery.  He has improved greatly but still has some mucus production.  Plan: -CXR pa/lat today -if persistent infiltrate I will have him take another course of antibiotics -I advised him to restart using the incentive spirometer and flutter valve. -I advised  him to walk more for improved pulmonary toilette/clearance  Cough Due to his recent pneumonia; improving.  However he notes that he typically gets wheezing and cough with seasonal allergies.  This may represent asthma or COPD given his smoking history.  Plan: -defer PFT's now given recent median sternotomy -rtc in 4-6 months and order PFT's then -continue inhaled fluticasone for now    Updated Medication List Outpatient Encounter Prescriptions as of 11/13/2011  Medication Sig Dispense Refill  . Acetaminophen 650 MG TABS Take 1,300 mg by mouth every 4 (four) hours as needed. For pain      . albuterol (PROVENTIL HFA;VENTOLIN HFA) 108 (90 BASE) MCG/ACT inhaler Inhale 2 puffs into the lungs every 4 (four) hours as needed for wheezing or shortness of breath.  1 Inhaler  0  . aspirin 81 MG tablet 81 mg. Take one tablet every other day.      Marland Kitchen atorvastatin (LIPITOR) 10 MG tablet Take 10 mg by mouth at bedtime.      . fenofibrate (TRICOR) 145 MG tablet Take one by mouth every other day      . ferrous sulfate 325 (65 FE) MG tablet Take 325 mg by mouth 2 (two) times daily with a meal.      . fluticasone (FLONASE) 50 MCG/ACT nasal spray USE TWO SPRAYS EACH NOSTRIL EVERY DAY  16 g  6  . fluticasone (FLOVENT HFA) 110 MCG/ACT inhaler Inhale 1 puff into the lungs as needed.        . metoCLOPramide (REGLAN) 10 MG tablet Take 1 tablet (10 mg total) by mouth 2 (two) times daily.      . metoprolol tartrate (LOPRESSOR) 25 MG tablet Take 25 mg by mouth 2 (two) times daily.      . Multiple Vitamin (MULITIVITAMIN WITH MINERALS) TABS Take 1 tablet by mouth daily.      Marland Kitchen oxyCODONE (OXY IR/ROXICODONE) 5 MG immediate  release tablet Take 5 mg by mouth every 6 (six) hours as needed. For pain      . sennosides-docusate sodium (SENOKOT-S) 8.6-50 MG tablet Take 2 tablets by mouth 2 (two) times daily as needed for constipation.      . metoCLOPramide (REGLAN) 10 MG tablet Take 10 mg by mouth 2 (two) times daily.      Marland Kitchen DISCONTD: metoCLOPramide (REGLAN) 10 MG tablet Take 1 tablet (10 mg total) by mouth once.  2 tablet  0

## 2011-11-13 NOTE — Assessment & Plan Note (Signed)
Due to his recent pneumonia; improving.  However he notes that he typically gets wheezing and cough with seasonal allergies.  This may represent asthma or COPD given his smoking history.  Plan: -defer PFT's now given recent median sternotomy -rtc in 4-6 months and order PFT's then -continue inhaled fluticasone for now

## 2011-11-13 NOTE — Telephone Encounter (Signed)
Noted, I updated the med list.  If the continue to have trouble with swallowing, then I want him to talk with GI about this.  Thanks.

## 2011-11-13 NOTE — Assessment & Plan Note (Addendum)
This occurred 10/27/2011 requiring hospitalization, complicated by splinting related to his recent aortic surgery.  He has improved greatly but still has some mucus production.  Plan: -CXR pa/lat today -if persistent infiltrate I will have him take another course of antibiotics -I advised him to restart using the incentive spirometer and flutter valve. -I advised him to walk more for improved pulmonary toilette/clearance

## 2011-11-13 NOTE — Patient Instructions (Signed)
We will refer you for a CXR to the [facility] office today. If it is abnormal we will call in antibiotics tomorrow. Continue using your incentive spirometer three times a day Continue using your flutter valve three times a day If your voice does not recover in 2-4 weeks let us know and we will refer you to ENT. Try to walk outside more, make it a goal to eventually walk 35 minutes daily. We will see you back in 4-6 months, sooner if needed.

## 2011-11-14 ENCOUNTER — Other Ambulatory Visit: Payer: Self-pay | Admitting: Family Medicine

## 2011-11-14 ENCOUNTER — Telehealth: Payer: Self-pay | Admitting: *Deleted

## 2011-11-14 NOTE — Telephone Encounter (Signed)
Sent!

## 2011-11-14 NOTE — Telephone Encounter (Signed)
Received refill electronically from pharmacy. Chart shows that this was prescribed by cardiologist. Is it okay to refill medication?

## 2011-11-14 NOTE — Telephone Encounter (Signed)
Message copied by Christen Butter on Wed Nov 14, 2011 10:35 AM ------      Message from: Veto Kemps B      Created: Wed Nov 14, 2011  2:10 AM       L,            Can you let Ray Fowler that his CXR was normal and he doesn't need any antibiotics?            Thanks,      Kipp Brood

## 2011-11-14 NOTE — Telephone Encounter (Signed)
Spoke with pt and notified of results per Dr.McQuaid. Pt verbalized understanding and denied any questions. 

## 2011-11-15 ENCOUNTER — Encounter: Payer: Self-pay | Admitting: Cardiovascular Disease

## 2011-11-15 ENCOUNTER — Ambulatory Visit (INDEPENDENT_AMBULATORY_CARE_PROVIDER_SITE_OTHER): Payer: 59 | Admitting: Cardiovascular Disease

## 2011-11-15 ENCOUNTER — Encounter: Payer: Self-pay | Admitting: Family Medicine

## 2011-11-15 VITALS — BP 113/83 | HR 101 | Ht 72.0 in | Wt 155.0 lb

## 2011-11-15 DIAGNOSIS — R490 Dysphonia: Secondary | ICD-10-CM

## 2011-11-15 DIAGNOSIS — I10 Essential (primary) hypertension: Secondary | ICD-10-CM

## 2011-11-15 DIAGNOSIS — D62 Acute posthemorrhagic anemia: Secondary | ICD-10-CM

## 2011-11-15 DIAGNOSIS — I2510 Atherosclerotic heart disease of native coronary artery without angina pectoris: Secondary | ICD-10-CM

## 2011-11-15 DIAGNOSIS — I7100 Dissection of unspecified site of aorta: Secondary | ICD-10-CM

## 2011-11-15 DIAGNOSIS — I7101 Dissection of thoracic aorta: Secondary | ICD-10-CM

## 2011-11-15 DIAGNOSIS — I4892 Unspecified atrial flutter: Secondary | ICD-10-CM

## 2011-11-15 NOTE — Assessment & Plan Note (Signed)
Anemia is slowly improving. This is likely contributing to underlying tachycardia and fatigue. We will hold off on increasing his beta blocker at this time given his borderline low blood pressure.

## 2011-11-15 NOTE — Assessment & Plan Note (Signed)
Recovering from his surgery, status post sternotomy one month ago, walking and slowly improving. We have encouraged that he continue his rehabilitation as he is doing on his own.

## 2011-11-15 NOTE — Progress Notes (Signed)
Patient ID: Ray Fowler, male    DOB: 21-Jan-1941, 71 y.o.   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 Post-op course complicated by persistent HTN and atrial flutter treated with amio (stopped in January 2011).  symptoms of orthostasis on his last clinic visit.   ECHO showed EF 45-50%. Had cardiac CT in 12/10 after abnormal myvoiew. Coronaries normal except for 25% RCA. 6mm stable pulmonary nodule.    echo (02/2010) EF 55-60% with grade 1 diastolic dysfx and previous AO dissection flap.   He developed pneumonia in November with chest x-ray showing dilated aorta. 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 He had arch the branching procedure involving neo-ascending aorta to the right common carotid and right subclavian arteries individually, and branch to the left common carotid artery, concomittant left carotid to the subclavian artery bypass in the neck with endovascular exclusion of the aneurysm with endograft to extend from the hemi-arch back autograft down to the distal descending thoracic aorta.  He is now postop one-month surgery and reports continued fatigue. He is walking with his wife on a regular basis. He does have mild discomfort in his chest and takes a pain medication when necessary. He has hoarseness on a frequent basis. He is currently on iron and hematocrit is improving, most recently 31.5. He has noticed that his heart rate is elevated with minimal exertion and even at rest.   EKG shows normal sinus rhythm with 101 beats per minute, no significant ST or T wave changes, rare PVCs   Outpatient Encounter Prescriptions as of 11/15/2011  Medication Sig  Dispense Refill  . Acetaminophen 650 MG TABS Take 1,300 mg by mouth every 4 (four) hours as needed. For pain      . albuterol (PROVENTIL HFA;VENTOLIN HFA) 108 (90 BASE) MCG/ACT inhaler Inhale 2 puffs into the lungs every 4 (four) hours as needed for wheezing or shortness of breath.  1 Inhaler  0  . aspirin 81 MG tablet 81 mg. Take one tablet every other day.      Marland Kitchen atorvastatin (LIPITOR) 10 MG tablet Take 10 mg by mouth at bedtime.      . fenofibrate (TRICOR) 145 MG tablet Take one by mouth every other day      . ferrous sulfate 325 (65 FE) MG tablet Take 325 mg by mouth 2 (two) times daily with a meal.      . FLOVENT HFA 110 MCG/ACT inhaler INHALE ONE PUFF BY MOUTH TWICE DAILY (RINSE AFTER USE)  12 g  11  . fluticasone (FLONASE) 50 MCG/ACT nasal spray USE TWO SPRAYS EACH NOSTRIL EVERY DAY  16 g  6  . metoCLOPramide (REGLAN) 10 MG tablet Take 1 tablet (10 mg total) by mouth 2 (two) times daily.      . metoprolol tartrate (LOPRESSOR) 25 MG tablet Take 25 mg by mouth 2 (two) times daily.      . Multiple Vitamin (MULITIVITAMIN WITH MINERALS) TABS Take 1 tablet by mouth daily.      Marland Kitchen oxyCODONE (OXY IR/ROXICODONE) 5 MG immediate release tablet Take 5 mg by mouth every 6 (six) hours as needed. For pain      .  sennosides-docusate sodium (SENOKOT-S) 8.6-50 MG tablet Take 2 tablets by mouth 2 (two) times daily as needed for constipation.      . metoCLOPramide (REGLAN) 10 MG tablet Take 10 mg by mouth 2 (two) times daily.        Review of Systems  Constitutional: Positive for fatigue and unexpected weight change.  HENT: Negative.   Eyes: Negative.   Respiratory: Negative.   Cardiovascular: Positive for chest pain.  Gastrointestinal: Negative.   Musculoskeletal: Negative.   Skin: Negative.   Neurological: Negative.   Hematological: Negative.   Psychiatric/Behavioral: Negative.   All other systems reviewed and are negative.    BP 113/83  Pulse 101  Ht 6' (1.829 m)  Wt 155 lb (70.308 kg)  BMI  21.02 kg/m2  Physical Exam  Nursing note and vitals reviewed. Constitutional: He is oriented to person, place, and time. He appears well-developed and well-nourished.  HENT:  Head: Normocephalic.  Nose: Nose normal.  Mouth/Throat: Oropharynx is clear and moist.  Eyes: Conjunctivae are normal. Pupils are equal, round, and reactive to light.  Neck: Normal range of motion. Neck supple. No JVD present.  Cardiovascular: Normal rate, regular rhythm, S1 normal, S2 normal, normal heart sounds and intact distal pulses.  Exam reveals no gallop and no friction rub.   No murmur heard. Pulmonary/Chest: Effort normal and breath sounds normal. No respiratory distress. He has no wheezes. He has no rales. He exhibits no tenderness.  Abdominal: Soft. Bowel sounds are normal. He exhibits no distension. There is no tenderness.  Musculoskeletal: Normal range of motion. He exhibits no edema and no tenderness.       Well-healed sternotomy scar  Lymphadenopathy:    He has no cervical adenopathy.  Neurological: He is alert and oriented to person, place, and time. Coordination normal.  Skin: Skin is warm and dry. No rash noted. No erythema.  Psychiatric: He has a normal mood and affect. His behavior is normal. Judgment and thought content normal.           Assessment and Plan

## 2011-11-15 NOTE — Assessment & Plan Note (Signed)
Repeat CT per [facility].  Appears stable and okay for outpatient f/u.  Nontoxic.  Pain controlled.  Ambulating.  His PNA appears to be clinically resolving, will defer to pulm. If the dysphagia/GERD continues, then he'll f/u with GI.  See notes on labs.  [facility] records reviewed.  >25 min spent with face to face with patient, >50% counseling and/or coordinating care

## 2011-11-15 NOTE — Assessment & Plan Note (Signed)
We have suggested he continue on metoprolol 25 mg twice a day. No changes made. We have suggested he monitor his heart rate and blood pressure at home.

## 2011-11-15 NOTE — Patient Instructions (Addendum)
You are doing well. Please try omeprazole 20 mg once a day for a few weeks  Try miralex or milk of magnesia for constipation, daily  Please call us if you have new issues that need to be addressed before your next appt.  Your physician wants you to follow-up in: 6 months.  You will receive a reminder letter in the mail two months in advance. If you don't receive a letter, please call our office to schedule the follow-up appointment.

## 2011-11-15 NOTE — Assessment & Plan Note (Signed)
He has followup with Dr. Kizzie Bane at [facility] in 6 months and 12 months with CT scans pending at those times.

## 2011-11-15 NOTE — Assessment & Plan Note (Signed)
We have suggested he try omeprazole 20 mg daily for continued waxing and waning worsens. Unable to exclude GERD. Uncertain if he will need to stay on his Reglan as he has no nausea or GI issues. I suspect he had some trauma to his vocal cords during intubation.

## 2011-11-21 ENCOUNTER — Telehealth: Payer: Self-pay | Admitting: Cardiovascular Disease

## 2011-11-21 NOTE — Telephone Encounter (Signed)
Pt calling states that his BP is running 67-100/50. States he has no energy. Wants to know what he should do

## 2011-11-21 NOTE — Telephone Encounter (Signed)
Systolic number is not above 100 and just does not have any energy.  States he just has no appetite and he is trying to eat.  Heart rate around 100 with all the checks.  One time when he first woke up it was around 70.  Will forward to Dr Mariah Milling for review.

## 2011-11-22 NOTE — Telephone Encounter (Signed)
Patient called,was told what Dr.Gollan advised.States he is already eating more.Advised to decrease metoprolol in half,states pill too small to cut.Patient was reassured and told to call back if not better.

## 2011-11-22 NOTE — Telephone Encounter (Signed)
Low blood pressure is likely from recent weight loss from his surgery. Would continue to eat as much as possible, add milk shakes for calories He can decrease his metoprolol in half though his heart rate might increase. Blood pressure will be linked to body weight. He is likely still recovering from recent surgery and that his his fatigue. Does he need home PT?

## 2011-11-23 ENCOUNTER — Other Ambulatory Visit: Payer: Self-pay | Admitting: *Deleted

## 2011-11-23 MED ORDER — METOPROLOL TARTRATE 25 MG PO TABS: 25.0000 mg | ORAL_TABLET | Freq: Two times a day (BID) | ORAL | Status: DC

## 2011-11-27 ENCOUNTER — Telehealth: Payer: Self-pay | Admitting: Cardiovascular Disease

## 2011-11-27 NOTE — Telephone Encounter (Signed)
Pt walked into clinic wanting BP checked. Spoke with Jasmine December about this was told to add him tomorrow for a nurse visit. Pt stated that he didn't want to come tomorrow. Can you call pt tomorrow and f/u to see if he wants to come in or how he is doing.

## 2011-11-28 NOTE — Telephone Encounter (Signed)
SPOKE WITH PT  B/P  AT 2:00 PM WAS 111/78 AND HR 101  INSTRUCTED PT  TO CONT TO TAKE METOPROLOL BASED ON  LATEST READING  AND TO CONT TO MONITOR  B/P  AND HEART RATE  WILL CALL ON Friday  WITH ADDITIONAL READINGS WILL FORWARD TO DR Mariah Milling FOR REVIEW .Zack Seal

## 2011-11-28 NOTE — Telephone Encounter (Signed)
Can we called to check on his blood pressure and heart rate today If blood pressure continues to be low, he should hold metoprolol, Increase fluids, increase salt intake Continue to monitor her pressure over the next few days Would recommend calling our office before the weekend

## 2011-11-28 NOTE — Telephone Encounter (Signed)
Patient is having problems with his blood pressure running low this am 84/62 with 60 heart rate; he drank water and took his blood pressure again the reading is 100/71 pulse 61. He is taking the metoprolol 25 mg 1/2 tablet twice a day which was with the above BP/HR readings. He is feeling fatigue and not sure if it is anemia or heart rate/blood pressure/metoprolol. Told the patient to stay on the metoprolol 25 mg 1/2 tablet twice a day. Told the patient needs to come in for an EKG/blood pressure check today or at least this week but patient refused. He will continue to monitor blood pressure and heart rate readings and will bring to the office for Dr. Mariah Milling to review.

## 2011-11-28 NOTE — Telephone Encounter (Signed)
LMTCB ./CY 

## 2011-11-28 NOTE — Telephone Encounter (Signed)
LMOM for patient to call the office. 

## 2011-12-09 ENCOUNTER — Encounter: Payer: Self-pay | Admitting: Family Medicine

## 2011-12-20 ENCOUNTER — Other Ambulatory Visit: Payer: Self-pay | Admitting: Family Medicine

## 2012-02-20 ENCOUNTER — Encounter: Payer: Self-pay | Admitting: Cardiovascular Disease

## 2012-02-20 ENCOUNTER — Ambulatory Visit (INDEPENDENT_AMBULATORY_CARE_PROVIDER_SITE_OTHER): Payer: Medicare Other | Admitting: Cardiovascular Disease

## 2012-02-20 VITALS — BP 140/80 | HR 88 | Ht 72.0 in | Wt 158.2 lb

## 2012-02-20 DIAGNOSIS — I10 Essential (primary) hypertension: Secondary | ICD-10-CM

## 2012-02-20 DIAGNOSIS — I7101 Dissection of thoracic aorta: Secondary | ICD-10-CM

## 2012-02-20 DIAGNOSIS — I4892 Unspecified atrial flutter: Secondary | ICD-10-CM

## 2012-02-20 NOTE — Patient Instructions (Addendum)
You are doing well. No medication changes were made.  Please come in for blood pressure check and comparison with your blood pressure cuff  Please call us if you have new issues that need to be addressed before your next appt.  Your physician wants you to follow-up in: 6 months.  You will receive a reminder letter in the mail two months in advance. If you don't receive a letter, please call our office to schedule the follow-up appointment.

## 2012-02-20 NOTE — Assessment & Plan Note (Signed)
He is maintaining normal sinus rhythm. 

## 2012-02-20 NOTE — Assessment & Plan Note (Signed)
Blood pressure is borderline elevated. He does report having low blood pressures at home with occasional symptoms of dizziness. We have suggested he monitor his blood pressure at home and provide [place] with some numbers of possible

## 2012-02-20 NOTE — Assessment & Plan Note (Signed)
He has followup with [facility] in the fall prior to leaving for [place]. They will do a CT scan at that time. No new complaints

## 2012-02-20 NOTE — Progress Notes (Signed)
Patient ID: Ray Fowler, male    DOB: 31-Jul-1941, 71 y.o.   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 Post-op course complicated by persistent HTN and atrial flutter treated with amio (stopped in January 2011).  symptoms of orthostasis on his last clinic visit.   ECHO showed EF 45-50%. Had cardiac CT in 12/10 after abnormal myvoiew. Coronaries normal except for 25% RCA. 6mm stable pulmonary nodule.    echo (02/2010) EF 55-60% with grade 1 diastolic dysfx and previous AO dissection flap.    pneumonia in November with chest x-ray showing dilated aorta. 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  He reports that he is relatively stable. He is trying to stay active get his blood pressure is somewhat labile though he does not have any clear numbers. He reports that sometimes it is low. Rare episodes of lightheadedness or dizziness. He still questioning why he developed the descending aorta dilatation. He is planning on going overseas to [place] in the fall. He is scheduled to see [facility] prior to his travel. Rare episodes of chest discomfort.   EKG shows normal sinus rhythm with 88 beats per minute, no significant ST or T wave changes   Outpatient Encounter Prescriptions as of  02/20/2012  Medication Sig Dispense Refill  . aspirin 81 MG tablet 81 mg. Take one tablet every other day.      Marland Kitchen atorvastatin (LIPITOR) 10 MG tablet Take 10 mg by mouth at bedtime.      Marland Kitchen FLOVENT HFA 110 MCG/ACT inhaler INHALE ONE PUFF BY MOUTH TWICE DAILY (RINSE AFTER USE)  12 g  11  . fluticasone (FLONASE) 50 MCG/ACT nasal spray       . metoprolol tartrate (LOPRESSOR) 25 MG tablet Take 1 tablet (25 mg total) by mouth 2 (two) times daily.  60 tablet  6  . Multiple Vitamin (MULITIVITAMIN WITH MINERALS) TABS Take 1 tablet by mouth daily.      . TRICOR 145 MG tablet TAKE ONE TABLET BY MOUTH ONE TIME DAILY  30 each  11    Review of Systems  Constitutional: Positive for fatigue.  HENT: Negative.   Eyes: Negative.   Respiratory: Negative.   Gastrointestinal: Negative.   Musculoskeletal: Negative.   Skin: Negative.   Neurological: Positive for weakness.  Hematological: Negative.   Psychiatric/Behavioral: Negative.   All other systems reviewed and are negative.    BP 140/80  Pulse 88  Ht 6' (1.829 m)  Wt 158 lb 4 oz (71.782 kg)  BMI 21.46 kg/m2  Physical Exam  Nursing note and vitals reviewed. Constitutional: He is oriented to person, place, and time. He appears well-developed and well-nourished.  HENT:  Head: Normocephalic.  Nose: Nose normal.  Mouth/Throat: Oropharynx is clear and moist.  Eyes: Conjunctivae are normal. Pupils are equal, round,  and reactive to light.  Neck: Normal range of motion. Neck supple. No JVD present.  Cardiovascular: Normal rate, regular rhythm, S1 normal, S2 normal and intact distal pulses.  Exam reveals no gallop and no friction rub.   Murmur heard.  Crescendo systolic murmur is present with a grade of 2/6  Pulmonary/Chest: Effort normal and breath sounds normal. No respiratory distress. He has no wheezes. He has no rales. He exhibits no tenderness.  Abdominal: Soft. Bowel sounds are normal. He exhibits no distension. There is no tenderness.    Musculoskeletal: Normal range of motion. He exhibits no edema and no tenderness.       Well-healed sternotomy scar  Lymphadenopathy:    He has no cervical adenopathy.  Neurological: He is alert and oriented to person, place, and time. Coordination normal.  Skin: Skin is warm and dry. No rash noted. No erythema.  Psychiatric: He has a normal mood and affect. His behavior is normal. Judgment and thought content normal.           Assessment and Plan

## 2012-02-22 IMAGING — CR DG CHEST 2V
2 series · 2 of 2 positions shown · non-contrast
Comparison: 04/03/2009.

CLINICAL DATA: Pneumonia.

CHEST - 2 VIEW

[view not recorded (1 of 2)]
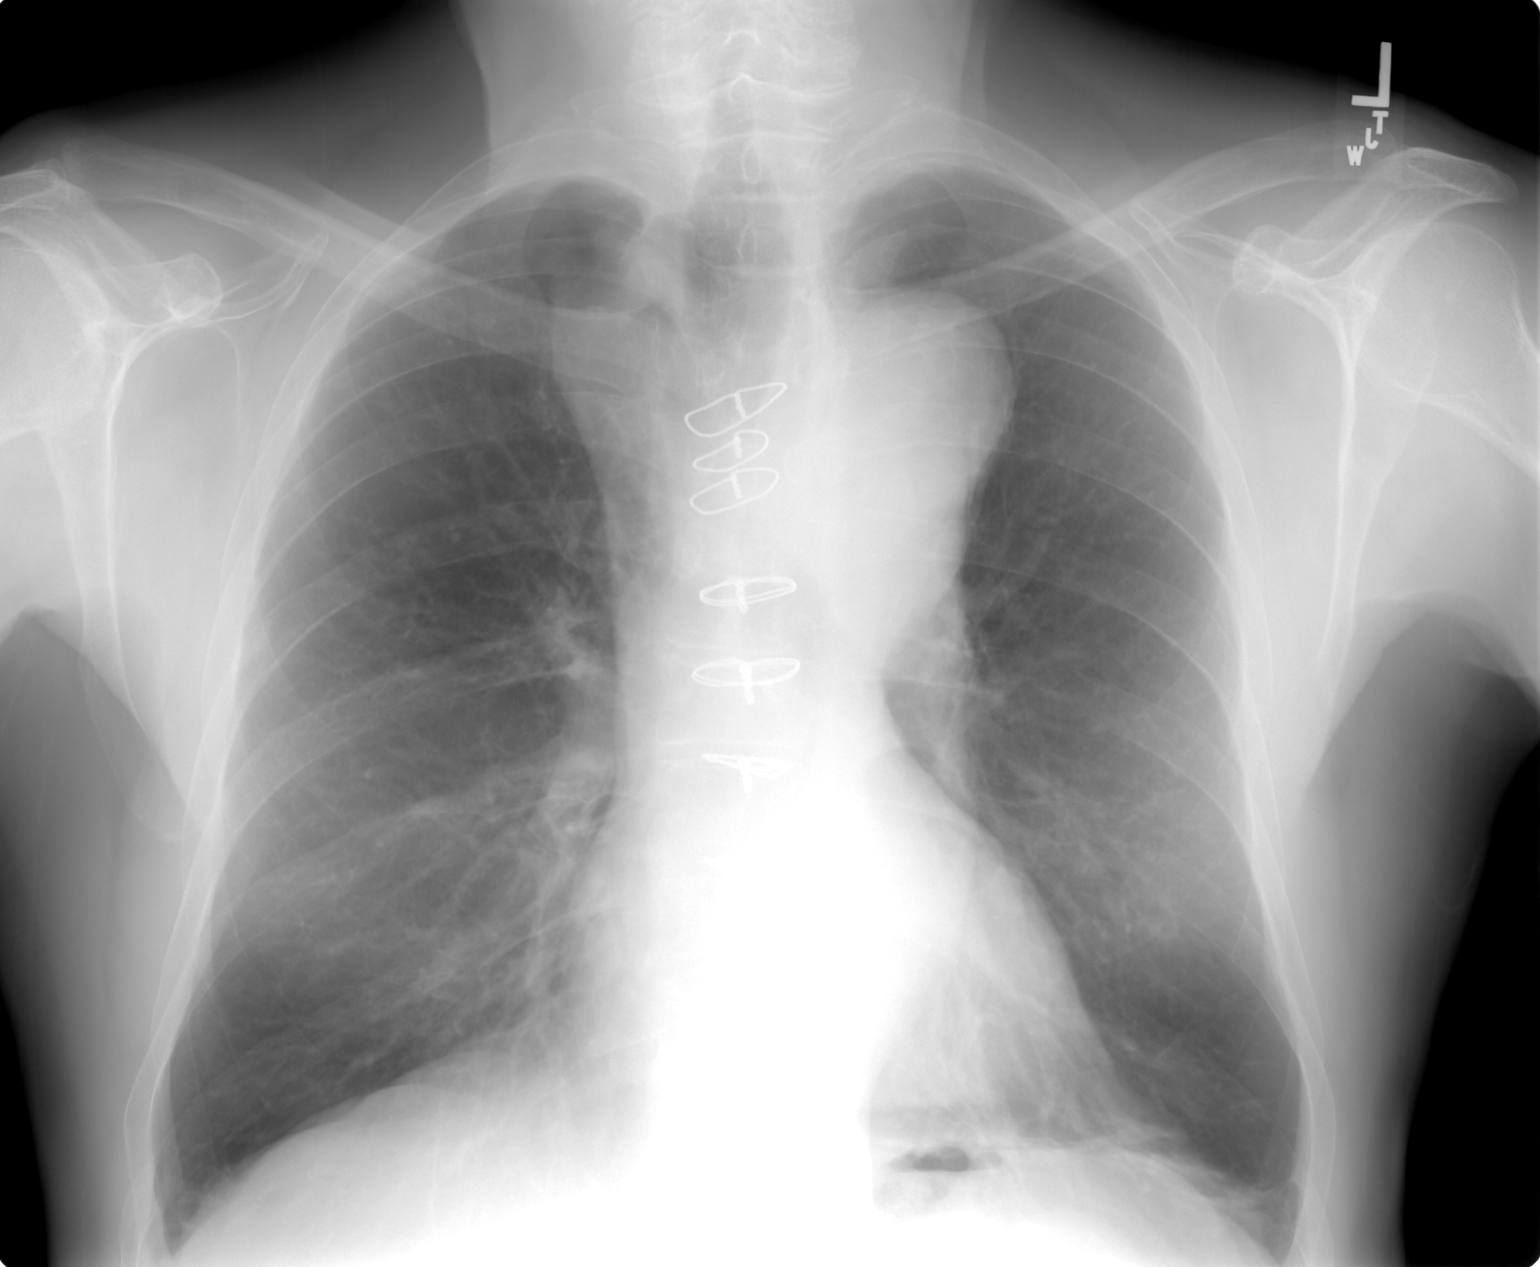

[view not recorded (2 of 2)]
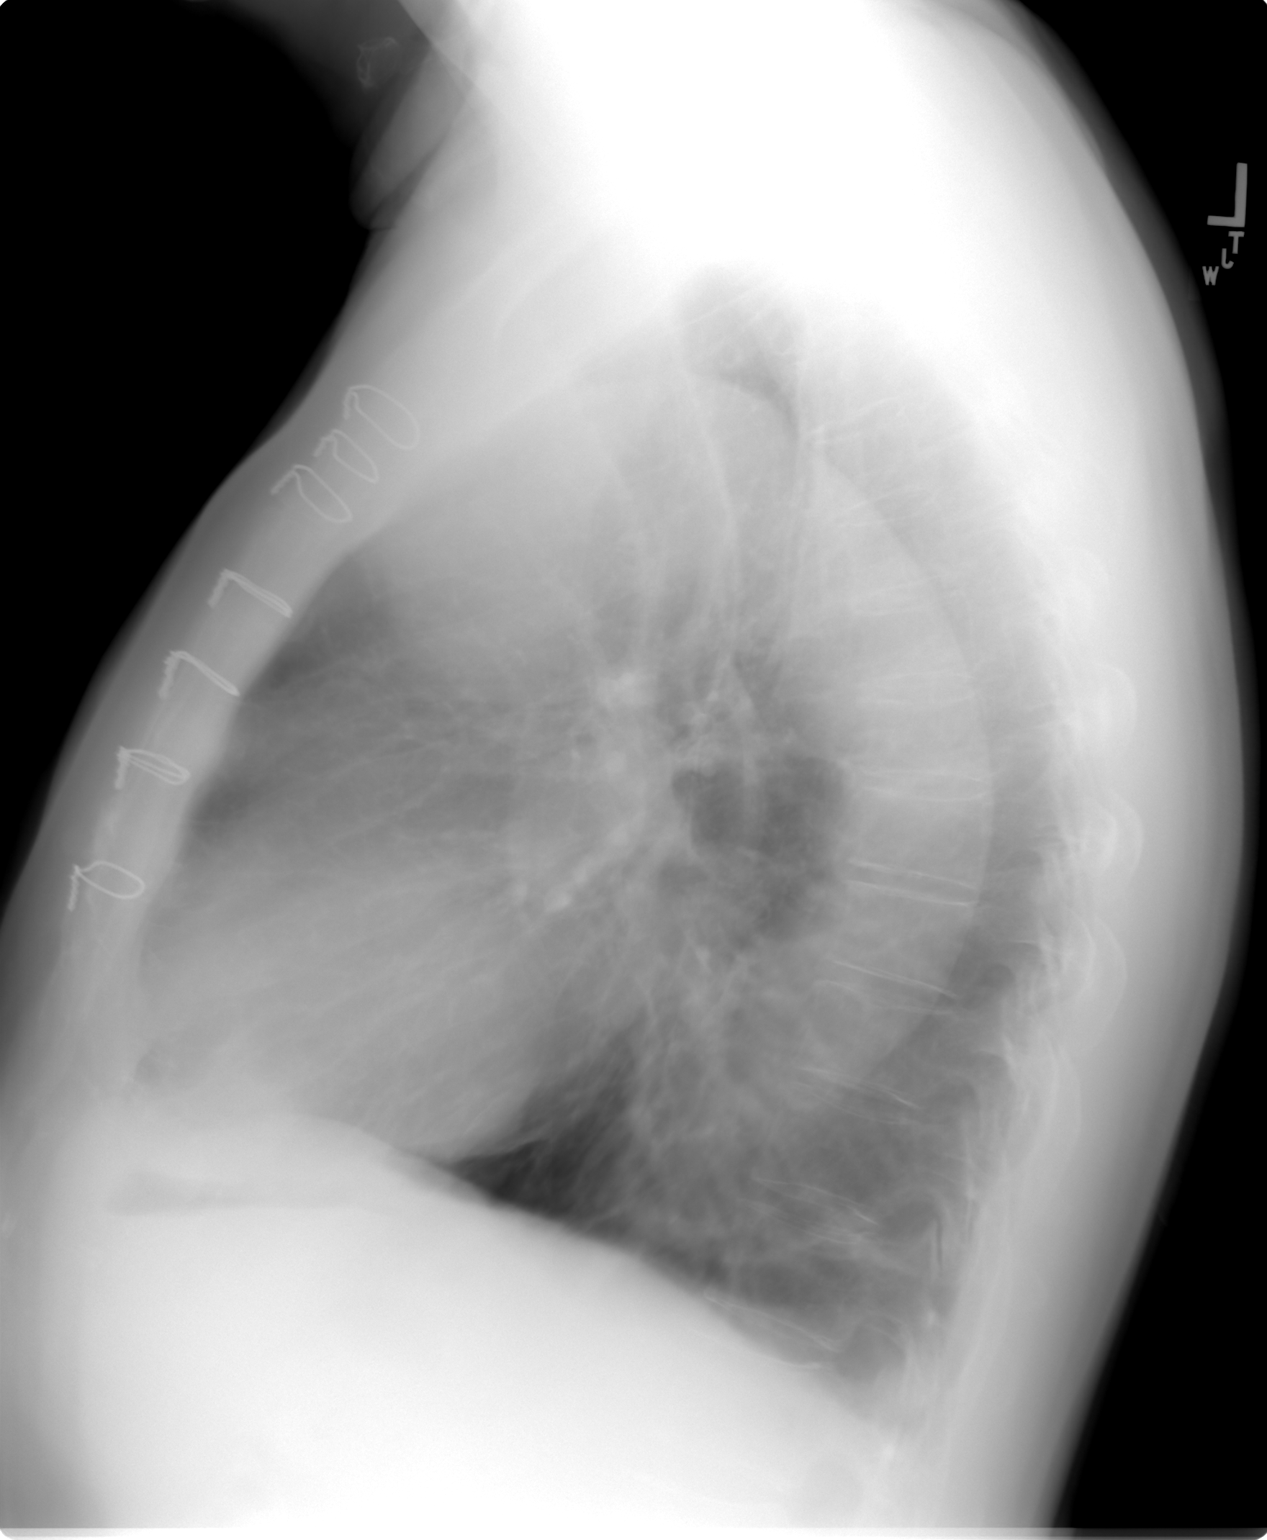

[2 of 2 positions shown; findings below may reference images not displayed]

FINDINGS: There is a significant dilatation of the aortic arch.
This is increased since prior chest CT which demonstrated an aortic
dissection.  Prior median sternotomy.  Left basilar scarring or
atelectasis.  Right lung is clear.  No effusions.  Heart is normal
size.
IMPRESSION: Worsening dilatation of the aortic arch. Prior chest CT
demonstrated aortic dissection.

## 2012-03-11 ENCOUNTER — Other Ambulatory Visit: Payer: Medicare Other

## 2012-03-24 ENCOUNTER — Other Ambulatory Visit: Payer: Self-pay | Admitting: Family Medicine

## 2012-05-06 ENCOUNTER — Encounter: Payer: Self-pay | Admitting: Gastroenterology

## 2012-05-08 ENCOUNTER — Ambulatory Visit (INDEPENDENT_AMBULATORY_CARE_PROVIDER_SITE_OTHER): Payer: Medicare Other | Admitting: *Deleted

## 2012-05-08 ENCOUNTER — Telehealth: Payer: Self-pay

## 2012-05-08 DIAGNOSIS — Z23 Encounter for immunization: Secondary | ICD-10-CM

## 2012-05-08 NOTE — Telephone Encounter (Signed)
Pt going out of country next week and wants flu shot today. Pt scheduled on flu shot clinic today at 12:00 noon. Pt notified.

## 2012-06-18 ENCOUNTER — Encounter: Payer: Self-pay | Admitting: Gastroenterology

## 2012-06-24 ENCOUNTER — Other Ambulatory Visit: Payer: Self-pay | Admitting: Cardiovascular Disease

## 2012-06-24 NOTE — Telephone Encounter (Signed)
Refilled Metoprolol

## 2012-06-25 ENCOUNTER — Other Ambulatory Visit: Payer: Self-pay | Admitting: Family Medicine

## 2012-06-25 DIAGNOSIS — E7800 Pure hypercholesterolemia, unspecified: Secondary | ICD-10-CM

## 2012-06-25 DIAGNOSIS — C61 Malignant neoplasm of prostate: Secondary | ICD-10-CM

## 2012-07-04 ENCOUNTER — Other Ambulatory Visit (INDEPENDENT_AMBULATORY_CARE_PROVIDER_SITE_OTHER): Payer: Medicare Other

## 2012-07-04 DIAGNOSIS — Z125 Encounter for screening for malignant neoplasm of prostate: Secondary | ICD-10-CM

## 2012-07-04 DIAGNOSIS — E7800 Pure hypercholesterolemia, unspecified: Secondary | ICD-10-CM

## 2012-07-04 DIAGNOSIS — C61 Malignant neoplasm of prostate: Secondary | ICD-10-CM

## 2012-07-04 LAB — LIPID PANEL
Cholesterol: 130 mg/dL (ref 0–200)
LDL Cholesterol: 82 mg/dL (ref 0–99)
Total CHOL/HDL Ratio: 4
VLDL: 10.8 mg/dL (ref 0.0–40.0)

## 2012-07-04 LAB — COMPREHENSIVE METABOLIC PANEL
ALT: 15 U/L (ref 0–53)
AST: 26 U/L (ref 0–37)
Albumin: 4.2 g/dL (ref 3.5–5.2)
Alkaline Phosphatase: 46 U/L (ref 39–117)
Calcium: 9.2 mg/dL (ref 8.4–10.5)
Chloride: 104 mEq/L (ref 96–112)
Potassium: 4.1 mEq/L (ref 3.5–5.1)
Sodium: 140 mEq/L (ref 135–145)
Total Protein: 7.8 g/dL (ref 6.0–8.3)

## 2012-07-11 ENCOUNTER — Encounter: Payer: Medicare Other | Admitting: Family Medicine

## 2012-07-17 ENCOUNTER — Ambulatory Visit (INDEPENDENT_AMBULATORY_CARE_PROVIDER_SITE_OTHER): Payer: Medicare Other | Admitting: Family Medicine

## 2012-07-17 ENCOUNTER — Encounter: Payer: Self-pay | Admitting: Family Medicine

## 2012-07-17 VITALS — BP 140/84 | HR 95 | Temp 98.5°F | Ht 72.0 in | Wt 167.0 lb

## 2012-07-17 DIAGNOSIS — Z9889 Other specified postprocedural states: Secondary | ICD-10-CM

## 2012-07-17 DIAGNOSIS — I10 Essential (primary) hypertension: Secondary | ICD-10-CM

## 2012-07-17 DIAGNOSIS — E781 Pure hyperglyceridemia: Secondary | ICD-10-CM

## 2012-07-17 DIAGNOSIS — Z0000 Encounter for general adult medical examination without abnormal findings: Secondary | ICD-10-CM

## 2012-07-17 DIAGNOSIS — R918 Other nonspecific abnormal finding of lung field: Secondary | ICD-10-CM

## 2012-07-17 DIAGNOSIS — Z8546 Personal history of malignant neoplasm of prostate: Secondary | ICD-10-CM

## 2012-07-17 DIAGNOSIS — J309 Allergic rhinitis, unspecified: Secondary | ICD-10-CM

## 2012-07-17 DIAGNOSIS — J302 Other seasonal allergic rhinitis: Secondary | ICD-10-CM

## 2012-07-17 NOTE — Patient Instructions (Addendum)
Recheck in 6 months.  Take a copy of BP readings to Dr. Mariah Milling in 1/14  Take care.  Glad to see you.  Keep exercising.

## 2012-07-18 ENCOUNTER — Telehealth: Payer: Self-pay | Admitting: Family Medicine

## 2012-07-18 ENCOUNTER — Encounter: Payer: Self-pay | Admitting: Family Medicine

## 2012-07-18 DIAGNOSIS — Z0000 Encounter for general adult medical examination without abnormal findings: Secondary | ICD-10-CM | POA: Insufficient documentation

## 2012-07-18 DIAGNOSIS — Z8546 Personal history of malignant neoplasm of prostate: Secondary | ICD-10-CM | POA: Insufficient documentation

## 2012-07-18 DIAGNOSIS — J309 Allergic rhinitis, unspecified: Secondary | ICD-10-CM | POA: Insufficient documentation

## 2012-07-18 NOTE — Assessment & Plan Note (Signed)
Uses inhalers seasonally.  ctab today.

## 2012-07-18 NOTE — Progress Notes (Signed)
I have personally reviewed the Medicare Annual Wellness questionnaire and have noted 1. The patient's medical and social history 2. Their use of alcohol, tobacco or illicit drugs 3. Their current medications and supplements 4. The patient's functional ability including ADL's, fall risks, home safety risks and hearing or visual             impairment. 5. Diet and physical activities 6. Evidence for depression or mood disorders  The patients weight, height, BMI have been recorded in the chart and visual acuity is per eye clinic.  I have made referrals, counseling and provided education to the patient based review of the above and I have provided the pt with a written personalized care plan for preventive services.  See scanned forms.  Routine anticipatory guidance given to patient.  See health maintenance. Tetanus 2005 Flu yearly Shingles 2008 PNA 2010 Colonoscopy- pending for F/u.  Advance directive d/w tp.  Wife designated if incapacitated.   H/o Prostate cancer- no change in stream.  occ stress incont.  PSA 0, d/w pt.   H/o pulmonary nodules, mult CTs since initially noted and will notify [facility] about this.  He would likely not need f/u imaging specifically for the nodules with concurrent imaging to follow aortic repair. D/w pt.    H/o aortic arch repair, followed by [facility] with f/u CT showing no leak and normal placement of hardware.  No chest pain.  Has gradually regained strength and stamina in the interval.    Hypertension:    Using medication without problems or lightheadedness: y Chest pain with exertion:n Edema:n Short of breath:n  Elevated Cholesterol: Using medications without problems:y Muscle aches: n Diet compliance:y Exercise:y  H/o asthma/allergy sx with inhaler use limited to seasonal exacerbations.  No wheeze and doing well.    PMH and SH reviewed  Meds, vitals, and allergies reviewed.   ROS: See HPI.  Otherwise negative.    GEN: nad, alert and  oriented HEENT: mucous membranes moist NECK: supple w/o LA CV: rrr. Murmur noted PULM: ctab, no inc wob ABD: soft, +bs EXT: no edema, symmetric pulses SKIN: no acute rash Mult chest/abd scars well healed.

## 2012-07-18 NOTE — Assessment & Plan Note (Signed)
Controlled, continue current meds.   

## 2012-07-18 NOTE — Telephone Encounter (Signed)
He has an appt to be re-scanned in September 2014.  This was from the person answering messages at the office.  I asked if the recommendation was any sooner because of his high risk for bronchogenic carcinoma.  She was not able to answer that question and said that would need to be forwarded to the PA who would not be able to respond until Monday.  Does this answer your question or should I leave the message for the PA?

## 2012-07-18 NOTE — Telephone Encounter (Signed)
LMOVM of this number.

## 2012-07-18 NOTE — Assessment & Plan Note (Signed)
Will notify [facility] re: imaging.

## 2012-07-18 NOTE — Assessment & Plan Note (Signed)
Continue current meds, he'll monitor BP and take readings to cards eval next month.

## 2012-07-18 NOTE — Assessment & Plan Note (Signed)
See scanned forms.  Routine anticipatory guidance given to patient.  See health maintenance. Tetanus 2005 Flu yearly Shingles 2008 PNA 2010 Colonoscopy- pending for F/u.  Advance directive d/w tp.  Wife designated if incapacitated.

## 2012-07-18 NOTE — Assessment & Plan Note (Signed)
PSA zero, DRE deferred.

## 2012-07-18 NOTE — Assessment & Plan Note (Signed)
Per [facility].  Doing well.

## 2012-07-18 NOTE — Telephone Encounter (Signed)
Please call [facility] clinic.  [phone].  Prev CT done 08/2011.  Multiple tiny pulmonary nodules in the lungs bilaterally, largest of which measures up to 6 mm in diameter in the lateral  segment of the right middle lobe. Given the smoking related changes in the lungs, the patient appears to be high risk for bronchogenic carcinoma, accordingly a follow-up chest CT at 6-12 months is recommended.  I didn't want to rescan him if not needed.  He had mult CTs done at [facility] this summer.  If any of them document stability on the nodules, then we wouldn't need to rescan now (esp if they are going to rescan him in 2014 anyway due to the aortic repair).  Thanks.

## 2012-07-18 NOTE — Telephone Encounter (Signed)
I wanted to know if they could use any of the prev scans from this summer at [facility] to comment on the presence of pulmonary nodules prev seen on 1/13 CT scan.  If not, then we'll need to address the pulmonary nodule f/u here.

## 2012-07-21 NOTE — Telephone Encounter (Signed)
LMOVM of Melissa, NP.

## 2012-07-22 NOTE — Telephone Encounter (Signed)
LMOVM to Vickie at contact number listed for [facility].

## 2012-07-22 NOTE — Telephone Encounter (Signed)
Melissa called back saying that their radiologist called it "calcified granulomas" in September 2013.  She is faxing me the report.

## 2012-07-22 NOTE — Telephone Encounter (Signed)
I reviewed the report.  There doesn't appear to be anything alarming about the pulmonary nodules based on the imaging from [facility].   Call pt.  It doesn't appear that he would need addition imaging at this time.  It would make sense for [facility] to be aware of the nodules since he'll have f/u CT there next year.  Please notify the staff at [facility] and ask for a note to be made in his file re: specific attention to the nodules on f/u CT next year.    I would like not to scan his mult times if not needed.    Thanks.

## 2012-07-31 ENCOUNTER — Ambulatory Visit (AMBULATORY_SURGERY_CENTER): Payer: Medicare Other | Admitting: *Deleted

## 2012-07-31 VITALS — Ht 72.0 in | Wt 165.8 lb

## 2012-07-31 DIAGNOSIS — Z1211 Encounter for screening for malignant neoplasm of colon: Secondary | ICD-10-CM

## 2012-08-04 NOTE — Addendum Note (Signed)
Addended by: Maple Hudson on: 08/04/2012 01:28 PM   Modules accepted: Level of Service

## 2012-08-12 ENCOUNTER — Telehealth: Payer: Self-pay | Admitting: Gastroenterology

## 2012-08-12 NOTE — Telephone Encounter (Signed)
Called pt again on his cell number and left a message at home also. No response!

## 2012-08-12 NOTE — Telephone Encounter (Signed)
Called patient and left message to call our office back. Ulis Rias RN

## 2012-08-13 DIAGNOSIS — C4491 Basal cell carcinoma of skin, unspecified: Secondary | ICD-10-CM

## 2012-08-13 HISTORY — DX: Basal cell carcinoma of skin, unspecified: C44.91

## 2012-08-14 ENCOUNTER — Encounter: Payer: Self-pay | Admitting: Gastroenterology

## 2012-08-14 ENCOUNTER — Ambulatory Visit (AMBULATORY_SURGERY_CENTER): Payer: Medicare Other | Admitting: Gastroenterology

## 2012-08-14 VITALS — BP 142/78 | HR 72 | Temp 97.8°F | Resp 9 | Ht 72.0 in | Wt 165.0 lb

## 2012-08-14 DIAGNOSIS — Z86010 Personal history of colonic polyps: Secondary | ICD-10-CM

## 2012-08-14 DIAGNOSIS — Z1211 Encounter for screening for malignant neoplasm of colon: Secondary | ICD-10-CM

## 2012-08-14 DIAGNOSIS — D126 Benign neoplasm of colon, unspecified: Secondary | ICD-10-CM

## 2012-08-14 MED ORDER — SODIUM CHLORIDE 0.9 % IV SOLN: 500.0000 mL | INTRAVENOUS | Status: DC

## 2012-08-14 NOTE — Progress Notes (Signed)
Called to room to assist during endoscopic procedure.  Patient ID and intended procedure confirmed with present staff. Received instructions for my participation in the procedure from the performing physician.  

## 2012-08-14 NOTE — Op Note (Signed)
[facility] [address] [place], [address]   COLONOSCOPY PROCEDURE REPORT  PATIENT: Ray, Fowler  MR#: [id number] BIRTHDATE: 07-09-1941 , 71  yrs. old GENDER: Male ENDOSCOPIST: Meryl Dare, MD, [facility] PROCEDURE DATE:  08/14/2012 PROCEDURE:   Colonoscopy with snare polypectomy ASA CLASS:   Class II INDICATIONS:Patient's personal history of adenomatous colon polyps.  MEDICATIONS: MAC sedation, administered by CRNA and propofol (Diprivan) 100mg  IV DESCRIPTION OF PROCEDURE:   After the risks benefits and alternatives of the procedure were thoroughly explained, informed consent was obtained.  A digital rectal exam revealed no abnormalities of the rectum.   The LB [id number]  endoscope was introduced through the anus and advanced to the cecum, which was identified by both the appendix and ileocecal valve. No adverse events experienced.   The quality of the prep was good, using MoviPrep  The instrument was then slowly withdrawn as the colon was fully examined.  COLON FINDINGS: A sessile polyp measuring 6 mm in size was found in the descending colon.  A polypectomy was performed with a cold snare.  The resection was complete and the polyp tissue was completely retrieved. Moderate diverticulosis was noted in the sigmoid colon.  The colon was otherwise normal.  There was no diverticulosis, inflammation, polyps or cancers unless previously stated.  Retroflexed views revealed small internal hemorrhoids. The time to cecum=2 minutes 11 seconds.  Withdrawal time=9 minutes 16 seconds.  The scope was withdrawn and the procedure completed. COMPLICATIONS: There were no complications.  ENDOSCOPIC IMPRESSION: 1.   Sessile polyp measuring 6 mm in the descending colon; polypectomy performed with a cold snare 2.   Moderate diverticulosis was noted in the sigmoid colon 3.   Small internal hemorrhoids  RECOMMENDATIONS: 1.  Await pathology results 2.  High fiber  diet with liberal fluid intake. 3.  Repeat Colonoscopy in 5 years.   eSigned:  Meryl Dare, MD, [facility] 08/14/2012 10:27 AM

## 2012-08-14 NOTE — Progress Notes (Signed)
Propofol given over incremental dosages 

## 2012-08-14 NOTE — Patient Instructions (Signed)
YOU HAD AN ENDOSCOPIC PROCEDURE TODAY AT THE [facility]: Refer to the procedure report that was given to you for any specific questions about what was found during the examination.  If the procedure report does not answer your questions, please call your gastroenterologist to clarify.  If you requested that your care partner not be given the details of your procedure findings, then the procedure report has been included in a sealed envelope for you to review at your convenience later.  YOU SHOULD EXPECT: Some feelings of bloating in the abdomen. Passage of more gas than usual.  Walking can help get rid of the air that was put into your GI tract during the procedure and reduce the bloating. If you had a lower endoscopy (such as a colonoscopy or flexible sigmoidoscopy) you may notice spotting of blood in your stool or on the toilet paper. If you underwent a bowel prep for your procedure, then you may not have a normal bowel movement for a few days.  DIET: Your first meal following the procedure should be a light meal and then it is ok to progress to your normal diet.  A half-sandwich or bowl of soup is an example of a good first meal.  Heavy or fried foods are harder to digest and may make you feel nauseous or bloated.  Likewise meals heavy in dairy and vegetables can cause extra gas to form and this can also increase the bloating.  Drink plenty of fluids but you should avoid alcoholic beverages for 24 hours.  ACTIVITY: Your care partner should take you home directly after the procedure.  You should plan to take it easy, moving slowly for the rest of the day.  You can resume normal activity the day after the procedure however you should NOT DRIVE or use heavy machinery for 24 hours (because of the sedation medicines used during the test).    SYMPTOMS TO REPORT IMMEDIATELY: A gastroenterologist can be reached at any hour.  During normal business hours, 8:30 AM to 5:00 PM Monday through Friday,  call [phone].  After hours and on weekends, please call the GI answering service at [phone] who will take a message and have the physician on call contact you.   Following lower endoscopy (colonoscopy or flexible sigmoidoscopy):  Excessive amounts of blood in the stool  Significant tenderness or worsening of abdominal pains  Swelling of the abdomen that is new, acute  Fever of 100F or higher    FOLLOW UP: If any biopsies were taken you will be contacted by phone or by letter within the next 1-3 weeks.  Call your gastroenterologist if you have not heard about the biopsies in 3 weeks.  Our staff will call the home number listed on your records the next business day following your procedure to check on you and address any questions or concerns that you may have at that time regarding the information given to you following your procedure. This is a courtesy call and so if there is no answer at the home number and we have not heard from you through the emergency physician on call, we will assume that you have returned to your regular daily activities without incident.  SIGNATURES/CONFIDENTIALITY: You and/or your care partner have signed paperwork which will be entered into your electronic medical record.  These signatures attest to the fact that that the information above on your After Visit Summary has been reviewed and is understood.  Full responsibility of the confidentiality   of this discharge information lies with you and/or your care-partner  Information on polyps,diverticulosis, hemorrhoids, and high fiber diet given to you today   

## 2012-08-14 NOTE — Progress Notes (Signed)
Patient did not experience any of the following events: a burn prior to discharge; a fall within the facility; wrong site/side/patient/procedure/implant event; or a hospital transfer or hospital admission upon discharge from the facility. (G8907) Patient did not have preoperative order for IV antibiotic SSI prophylaxis. (G8918)  

## 2012-08-15 ENCOUNTER — Telehealth: Payer: Self-pay | Admitting: *Deleted

## 2012-08-15 NOTE — Telephone Encounter (Signed)
  Follow up Call-  Call back number 08/14/2012  Post procedure Call Back phone  # [phone]  Permission to leave phone message Yes     Patient questions:  Do you have a fever, pain , or abdominal swelling? no Pain Score  0 *  Have you tolerated food without any problems? yes  Have you been able to return to your normal activities? yes  Do you have any questions about your discharge instructions: Diet   no Medications  no Follow up visit  no  Do you have questions or concerns about your Care? no  Actions: * If pain score is 4 or above: No action needed, pain <4.

## 2012-08-19 ENCOUNTER — Ambulatory Visit (INDEPENDENT_AMBULATORY_CARE_PROVIDER_SITE_OTHER): Payer: Medicare Other | Admitting: Cardiovascular Disease

## 2012-08-19 ENCOUNTER — Encounter: Payer: Self-pay | Admitting: Gastroenterology

## 2012-08-19 ENCOUNTER — Encounter: Payer: Self-pay | Admitting: Cardiovascular Disease

## 2012-08-19 VITALS — BP 162/82 | HR 78 | Ht 72.0 in | Wt 166.0 lb

## 2012-08-19 DIAGNOSIS — I4949 Other premature depolarization: Secondary | ICD-10-CM

## 2012-08-19 DIAGNOSIS — I429 Cardiomyopathy, unspecified: Secondary | ICD-10-CM

## 2012-08-19 DIAGNOSIS — I10 Essential (primary) hypertension: Secondary | ICD-10-CM

## 2012-08-19 DIAGNOSIS — I4892 Unspecified atrial flutter: Secondary | ICD-10-CM

## 2012-08-19 DIAGNOSIS — E785 Hyperlipidemia, unspecified: Secondary | ICD-10-CM | POA: Insufficient documentation

## 2012-08-19 DIAGNOSIS — I7100 Dissection of unspecified site of aorta: Secondary | ICD-10-CM

## 2012-08-19 MED ORDER — LISINOPRIL 5 MG PO TABS: 5.0000 mg | ORAL_TABLET | Freq: Two times a day (BID) | ORAL | Status: DC

## 2012-08-19 NOTE — Patient Instructions (Addendum)
You are doing well. Please start lisinopril 5 mg twice a day Monitor your blood pressure Call the office/melissa with blood pressure numbers in the next few weeks  Please call us if you have new issues that need to be addressed before your next appt.  Your physician wants you to follow-up in: 6 months.  You will receive a reminder letter in the mail two months in advance. If you don't receive a letter, please call our office to schedule the follow-up appointment.

## 2012-08-19 NOTE — Assessment & Plan Note (Signed)
Reason followup at [facility] with CT scan. Schedule repeat CT scan in one year at [facility].

## 2012-08-19 NOTE — Assessment & Plan Note (Signed)
Recheck of his blood pressure today shows systolic pressures in the 170 range. He is reluctant to start medications but will do so slowly. We'll start lisinopril 5 mg twice a day. He will monitor his blood pressure and e-mail [place] with his blood pressure numbers. We will slowly titrate up his lisinopril, possibly adding amlodipine if needed.

## 2012-08-19 NOTE — Assessment & Plan Note (Signed)
We have suggested he stay on his statin 

## 2012-08-19 NOTE — Progress Notes (Signed)
Patient ID: Ray Fowler, male    DOB: 06-21-1941, 72 y.o.   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 Post-op course complicated by persistent HTN and atrial flutter treated with amio (stopped in January 2011).     ECHO showed EF 45-50%. Had cardiac CT in 12/10 after abnormal myvoiew. Coronaries normal except for 25% RCA. 6mm stable pulmonary nodule.    echo (02/2010) EF 55-60% with grade 1 diastolic dysfx and previous AO dissection flap.   pneumonia in November 2012 with chest x-ray showing dilated aorta. 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  he was referred to [facility] for surgery. 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  Recent followup at [facility] with repeat CT scan confirming stable arch and descending aorta. Weight has been slowly increasing back to his baseline, somewhat high.  He was previously on amlodipine and lisinopril and carvedilol. Currently just on metoprolol. He had a recent trip to the [place] with no complications. Blood pressure at home has been running high in the 140-160 range systolic.   EKG shows normal sinus rhythm with 78 beats per minute, no significant ST or T wave changes   Outpatient Encounter Prescriptions as of 08/19/2012  Medication Sig Dispense Refill  . aspirin 81 MG tablet Take one tablet every other day.      Marland Kitchen  atorvastatin (LIPITOR) 10 MG tablet Take 10 mg by mouth at bedtime.      Marland Kitchen FLOVENT HFA 110 MCG/ACT inhaler INHALE ONE PUFF BY MOUTH TWICE DAILY (RINSE AFTER USE)  12 g  11  . fluticasone (FLONASE) 50 MCG/ACT nasal spray       . metoprolol tartrate (LOPRESSOR) 25 MG tablet TAKE ONE TABLET BY MOUTH TWICE DAILY  60 tablet  5  . Multiple Vitamin (MULITIVITAMIN WITH MINERALS) TABS Take 1 tablet by mouth daily.      . Multiple Vitamins-Minerals (MACULAR VITAMIN BENEFIT) TABS Take 1 tablet by mouth daily. Lutein 10 mg and Zeaxanthin 2 mg      . TRICOR 145 MG tablet TAKE ONE TABLET BY MOUTH ONE TIME DAILY  30 each  11    Review of Systems  HENT: Negative.   Eyes: Negative.   Respiratory: Negative.   Gastrointestinal: Negative.   Musculoskeletal: Negative.   Skin: Negative.   Hematological: Negative.   Psychiatric/Behavioral: Negative.   All other systems reviewed and are negative.    BP 162/82  Pulse 78  Ht 6' (1.829 m)  Wt 166 lb (75.297 kg)  BMI 22.51 kg/m2 Repeat blood pressure with systolic pressure greater than 170 Physical Exam  Nursing note and vitals reviewed. Constitutional: He is oriented to person, place, and time. He appears well-developed and well-nourished.  HENT:  Head: Normocephalic.  Nose: Nose normal.  Mouth/Throat: Oropharynx is clear and moist.  Eyes: Conjunctivae normal are normal. Pupils are equal, round, and reactive to light.  Neck:  Normal range of motion. Neck supple. No JVD present.  Cardiovascular: Normal rate, regular rhythm, S1 normal, S2 normal and intact distal pulses.  Exam reveals no gallop and no friction rub.   Murmur heard.  Crescendo systolic murmur is present with a grade of 2/6  Pulmonary/Chest: Effort normal and breath sounds normal. No respiratory distress. He has no wheezes. He has no rales. He exhibits no tenderness.  Abdominal: Soft. Bowel sounds are normal. He exhibits no distension. There is no tenderness.  Musculoskeletal: Normal range  of motion. He exhibits no edema and no tenderness.       Well-healed sternotomy scar  Lymphadenopathy:    He has no cervical adenopathy.  Neurological: He is alert and oriented to person, place, and time. Coordination normal.  Skin: Skin is warm and dry. No rash noted. No erythema.  Psychiatric: He has a normal mood and affect. His behavior is normal. Judgment and thought content normal.           Assessment and Plan

## 2012-09-15 ENCOUNTER — Telehealth: Payer: Self-pay

## 2012-09-15 NOTE — Telephone Encounter (Signed)
Pt was seen by Dr. Mariah Milling 1/7 Lisinopril 5 mg BID was started He was instructed to call us with BP readings BP=150-170 SBP He has tried increasing lisinopril on his own to 5 mg TID I told him I would let Dr. Mariah Milling know and will call him back with plan Understanding verb

## 2012-09-15 NOTE — Telephone Encounter (Signed)
Need to get his blood pressure down given aortic dissection history Would start amlodipine 10 mg daily Cut in half to start for the first week, Go to a full pill for systolic pressure greater than 140 Monitor blood pressures closely calling us with numbers once a week

## 2012-09-16 ENCOUNTER — Other Ambulatory Visit: Payer: Self-pay

## 2012-09-16 MED ORDER — AMLODIPINE BESYLATE 10 MG PO TABS: 10.0000 mg | ORAL_TABLET | Freq: Every day | ORAL | Status: DC

## 2012-09-16 MED ORDER — LISINOPRIL 5 MG PO TABS: 5.0000 mg | ORAL_TABLET | Freq: Three times a day (TID) | ORAL | Status: DC

## 2012-09-16 NOTE — Telephone Encounter (Signed)
Pt informed He is hesitant to start another medication and asks if we can just increase the dose of what we have him on. I explained we have to be more aggressive with his BP given his hx aortic dissection. He is still resistant to this but says he is agreeable to try this medication.  He will call us i 1 week to report how BPs are doing.

## 2012-09-29 ENCOUNTER — Other Ambulatory Visit: Payer: Self-pay

## 2012-09-29 MED ORDER — ATORVASTATIN CALCIUM 10 MG PO TABS: 10.0000 mg | ORAL_TABLET | Freq: Every day | ORAL | Status: DC

## 2012-09-29 NOTE — Telephone Encounter (Signed)
I called [facility] and spoke with Jesusita Oka; he said mid Jan electronic refill submitted for atorvastatin. I cannot see in pt's chart where refill request received. Jesusita Oka said if no response [facility] will weekly fax a request until doctor's office answers. I do not see faxed request entered into pt's chart. Jesusita Oka said unfortunately he does not have documentation that weekly faxing was done. I gave refill to [facility] # 90 x 1. I spoke with Mrs Onder because pt was driving and gave verbal permission to speak with his wife; I apologized for their difficulty in getting atorvastatin filled; pt has been taking for long period of time. I explained as above and pt's wife said that was OK. There was nothing else I could assist pt with.

## 2012-09-29 NOTE — Telephone Encounter (Signed)
Pt left note requesting status of refill on Atorvastatin that pt said [facility] has been attempting to get refill since 09/19/12. I called [facility] and got recording they are at lunch. I called pt and left v/m requesting pt to call back.

## 2012-10-29 ENCOUNTER — Other Ambulatory Visit: Payer: Self-pay | Admitting: Family Medicine

## 2012-11-04 ENCOUNTER — Telehealth: Payer: Self-pay

## 2012-11-04 NOTE — Telephone Encounter (Signed)
FYI See below 

## 2012-11-04 NOTE — Telephone Encounter (Signed)
Pt called to report BP progress since changing meds in Feb He is taking metoprolol 25 mg BID, lisinopril 5 mg BID and amlodipine 10 mg daily He c/o fatigue with minimal activity and BPs=95-120 mm hg SBP I advised he try cutting amlodipine in 1/2 He states this tablet is not scored and does not want to cut in half I then suggested he try decreasing the lisinopril to 5 mg qhs instead of BID and see if this helps fatigue as well as keeping BP under good control He will try this and will call us in a few days to report BPs

## 2012-11-04 NOTE — Telephone Encounter (Signed)
I would agree with cutting amlodipine in half If blood pressure continues to run low, perhaps we could send in a new prescription for 5 mg

## 2012-11-05 NOTE — Telephone Encounter (Signed)
Will reasses BPs in a few days

## 2012-11-10 ENCOUNTER — Telehealth: Payer: Self-pay

## 2012-11-10 NOTE — Telephone Encounter (Signed)
lmtcb

## 2012-11-10 NOTE — Telephone Encounter (Signed)
Bp and symptoms

## 2012-11-11 ENCOUNTER — Other Ambulatory Visit: Payer: Self-pay

## 2012-11-11 MED ORDER — AMLODIPINE BESYLATE 5 MG PO TABS: 5.0000 mg | ORAL_TABLET | Freq: Every day | ORAL | Status: DC

## 2012-11-11 NOTE — Telephone Encounter (Signed)
FYI

## 2012-11-11 NOTE — Telephone Encounter (Signed)
Pt reports BPs still "dropping" with exertion Says normally BPs=110-120, but when he is working in yard, etc BP=95/54 and feels very fatigued He has decreased lisinopril to qhs with no improvement in symptoms I advised him,. Per Dr. Windell Hummingbird suggestions, to try decreasing amlodipine to 5 mg qd He will try this and I will call him back 4/4 at 1000-1100 to reassess New RX sent to [facility]

## 2012-11-11 NOTE — Telephone Encounter (Signed)
Would probably just hold lisinopril for now Monitor blood pressure

## 2012-11-11 NOTE — Telephone Encounter (Signed)
lmtcb on home # lmtcb on wife's cell #

## 2012-11-14 ENCOUNTER — Telehealth: Payer: Self-pay

## 2012-11-14 ENCOUNTER — Other Ambulatory Visit: Payer: Self-pay

## 2012-11-14 NOTE — Telephone Encounter (Signed)
Pt reports BPs still fluctuating up and down as before with no change since decreasing amlodipine to 5 mg daily I advised, per Dr. Windell Hummingbird note, ok to hold lisinopril altogether to see if this helps He will try this and call us if it does not help

## 2012-11-14 NOTE — Telephone Encounter (Signed)
Assess BP 

## 2012-12-18 ENCOUNTER — Other Ambulatory Visit: Payer: Self-pay | Admitting: Cardiovascular Disease

## 2012-12-18 NOTE — Telephone Encounter (Signed)
Refilled Metoprolol #60 Refill#3 sent to [facility].

## 2012-12-23 ENCOUNTER — Other Ambulatory Visit: Payer: Self-pay | Admitting: Family Medicine

## 2012-12-24 NOTE — Telephone Encounter (Signed)
Received refill request electronically. See warning. Is it okay to refill medication? 

## 2012-12-25 NOTE — Telephone Encounter (Signed)
Okay to continue, sent. 

## 2013-02-08 ENCOUNTER — Other Ambulatory Visit: Payer: Self-pay | Admitting: Family Medicine

## 2013-02-19 ENCOUNTER — Encounter: Payer: Self-pay | Admitting: Cardiovascular Disease

## 2013-02-19 ENCOUNTER — Ambulatory Visit (INDEPENDENT_AMBULATORY_CARE_PROVIDER_SITE_OTHER): Payer: Medicare Other | Admitting: Cardiovascular Disease

## 2013-02-19 VITALS — BP 140/72 | HR 78 | Ht 72.0 in | Wt 170.5 lb

## 2013-02-19 DIAGNOSIS — I4892 Unspecified atrial flutter: Secondary | ICD-10-CM

## 2013-02-19 DIAGNOSIS — I2510 Atherosclerotic heart disease of native coronary artery without angina pectoris: Secondary | ICD-10-CM

## 2013-02-19 DIAGNOSIS — I4949 Other premature depolarization: Secondary | ICD-10-CM

## 2013-02-19 DIAGNOSIS — I10 Essential (primary) hypertension: Secondary | ICD-10-CM

## 2013-02-19 DIAGNOSIS — I7100 Dissection of unspecified site of aorta: Secondary | ICD-10-CM

## 2013-02-19 NOTE — Patient Instructions (Addendum)
You are doing well. No medication changes were made.  Please call us if you have new issues that need to be addressed before your next appt.  Your physician wants you to follow-up in: 6 months.  You will receive a reminder letter in the mail two months in advance. If you don't receive a letter, please call our office to schedule the follow-up appointment.   

## 2013-02-19 NOTE — Progress Notes (Signed)
Patient ID: Ray Fowler, male    DOB: [birth year], 72 y.o.   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 Post-op course complicated by persistent HTN and atrial flutter treated with amio (stopped in January 2011).     ECHO showed EF 45-50%. Had cardiac CT in 12/10 after abnormal myvoiew. Coronaries normal except for 25% RCA. 6mm stable pulmonary nodule.    echo (02/2010) EF 55-60% with grade 1 diastolic dysfx and previous AO dissection flap.   pneumonia in November 2012 with chest x-ray showing dilated aorta. 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  he was referred to [facility] for surgery. 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  followup at [facility] in September 2013 with repeat CT scan confirming stable arch and descending aorta.  He was unable to tolerate lisinopril as blood pressure was labile and was to low. He continues to have labile pressures tolerating amlodipine 5 mg daily and metoprolol twice a day. Very active at baseline, able to do most of his yard work. Some muscle cramping at times across his abdomen and back.    EKG shows normal sinus rhythm with 78 beats per minute, nonspecific ST abnormality in lead V4 through V6, 2, 3, aVF   Outpatient Encounter Prescriptions as of 02/19/2013  Medication Sig Dispense Refill  . amLODipine (NORVASC) 5  MG tablet Take 1 tablet (5 mg total) by mouth daily.  180 tablet  3  . aspirin 81 MG tablet Take one tablet every other day.      Marland Kitchen atorvastatin (LIPITOR) 10 MG tablet Take 1 tablet (10 mg total) by mouth at bedtime.  90 tablet  1  . fenofibrate (TRICOR) 145 MG tablet TAKE ONE TABLET BY MOUTH ONE TIME DAILY  30 tablet  5  . FLOVENT HFA 110 MCG/ACT inhaler INHALE ONE PUFF BY MOUTH TWICE DAILY RINSE MOUTH AFTER USE.  12 g  6  . fluticasone (FLONASE) 50 MCG/ACT nasal spray       . fluticasone (FLONASE) 50 MCG/ACT nasal spray USE TWO SPRAYS IN EACH NOSTRIL DAILY  16 g  5  . metoprolol tartrate (LOPRESSOR) 25 MG tablet TAKE ONE TABLET BY MOUTH TWICE DAILY  60 tablet  3  . Multiple Vitamin (MULITIVITAMIN WITH MINERALS) TABS Take 1 tablet by mouth daily.      . Multiple Vitamins-Minerals (MACULAR VITAMIN BENEFIT) TABS Take 1 tablet by mouth daily. Lutein 10 mg and Zeaxanthin 2 mg        Review of Systems  Constitutional: Negative.        Labile blood pressures, occasional dizziness  HENT: Negative.   Eyes: Negative.   Respiratory: Negative.   Cardiovascular: Negative.   Gastrointestinal: Negative.   Musculoskeletal: Negative.   Skin: Negative.   Neurological: Negative.   Psychiatric/Behavioral: Negative.   All other systems reviewed and are negative.    BP 140/72  Pulse 78  Ht 6' (1.829 m)  Wt 170 lb 8 oz (77.338 kg)  BMI 23.12 kg/m2  Physical Exam  Nursing note and vitals reviewed. Constitutional: He is oriented to person, place, and time. He appears well-developed and well-nourished.  HENT:  Head: Normocephalic.  Nose: Nose normal.  Mouth/Throat: Oropharynx is clear and moist.  Eyes: Conjunctivae are normal. Pupils are equal, round, and reactive to light.  Neck: Normal range of motion. Neck supple. No JVD present.  Cardiovascular: Normal rate, regular rhythm, S1 normal, S2 normal and intact distal pulses.  Exam reveals no gallop and no friction rub.   Murmur heard.  Crescendo  systolic murmur is present with a grade of 2/6  Pulmonary/Chest: Effort normal and breath sounds normal. No respiratory distress. He has no wheezes. He has no rales. He exhibits no tenderness.  Abdominal: Soft. Bowel sounds are normal. He exhibits no distension. There is no tenderness.  Musculoskeletal: Normal range of motion. He exhibits no edema and no tenderness.  Well-healed sternotomy scar  Lymphadenopathy:    He has no cervical adenopathy.  Neurological: He is alert and oriented to person, place, and time. Coordination normal.  Skin: Skin is warm and dry. No rash noted. No erythema.  Psychiatric: He has a normal mood and affect. His behavior is normal. Judgment and thought content normal.      Assessment and Plan

## 2013-02-19 NOTE — Assessment & Plan Note (Signed)
Blood pressure relatively well controlled on his current medication regimen. No changes made.

## 2013-02-19 NOTE — Assessment & Plan Note (Signed)
He has followup with [facility] in September 2014 with repeat imaging.

## 2013-03-23 ENCOUNTER — Other Ambulatory Visit: Payer: Self-pay | Admitting: Family Medicine

## 2013-03-23 NOTE — Telephone Encounter (Signed)
Would continue.  Sent.  

## 2013-03-23 NOTE — Telephone Encounter (Signed)
Received refill request electronically. See warning. Is it okay to refill medcation?

## 2013-04-05 ENCOUNTER — Encounter: Payer: Self-pay | Admitting: Cardiovascular Disease

## 2013-04-20 ENCOUNTER — Telehealth: Payer: Self-pay

## 2013-04-20 DIAGNOSIS — I10 Essential (primary) hypertension: Secondary | ICD-10-CM

## 2013-04-20 NOTE — Telephone Encounter (Signed)
Spoke w/ pt.  He would like to "come off of his amlodipine" and would like to know how to do this. He would like to do this before he goes on a trip at the end of the month, as they make him feel so bad. He would consider another med after his trip, but is worried about doing it safely.

## 2013-04-20 NOTE — Telephone Encounter (Signed)
Spoke w/ pt.  He would like to "come off of his amlodipine" and would like to know how to do this. He would like to do this before he goes on a trip at the end of the month, as they make him feel so bad. He would consider another med after his trip, but is worried about doing it safely.   

## 2013-04-22 NOTE — Telephone Encounter (Signed)
Would stop amloidpine Start lisinopril 20 mg daily (cut in 1/2 to start)  Monitor BO on a 1/2 pill for a few weeks If elevated, will increase to 20 mg daily

## 2013-04-23 MED ORDER — LISINOPRIL 20 MG PO TABS: 20.0000 mg | ORAL_TABLET | Freq: Every day | ORAL | Status: DC

## 2013-04-23 NOTE — Telephone Encounter (Signed)
Spoke w/ pt.  He understands to stop amlodipine today and will start lisinopril 20mg , 1/2 pill daily. He will monitor his BP for a few weeks, report his readings to me, and increase to a whole pill if his BP is elevated.   Sent rx to [facility] on [address]

## 2013-05-26 ENCOUNTER — Telehealth: Payer: Self-pay

## 2013-05-26 NOTE — Telephone Encounter (Signed)
Spoke w/ pt.  He states that he just returned from his trip. He was out of town x 10 days and did not check BP while gone. "140ish-150-ish/70ish" since he's been home. Out blowing leaves and raking yard this morning, BP was elevated, after resting, systolic was in 130s. Pt states this is unusual, as systolic has not gotten below "high 140s" since being on Lipitor. Pt states that he is taking a whole 20mg  pill.  He was instructed to start out on 1/2 pill, but "jumped to a whole pill in 2-3 days". Suggested to pt that he monitor his BP over the next few days, as he states that he was in a different time zone, he is still excited from his trip, and has not gotten back into his daily routine. Pt is agreeable to this, but wanted to make Dr. Mariah Milling aware.

## 2013-06-18 ENCOUNTER — Other Ambulatory Visit: Payer: Self-pay

## 2013-06-25 ENCOUNTER — Ambulatory Visit (INDEPENDENT_AMBULATORY_CARE_PROVIDER_SITE_OTHER): Payer: Medicare Other

## 2013-06-25 DIAGNOSIS — Z23 Encounter for immunization: Secondary | ICD-10-CM

## 2013-07-06 ENCOUNTER — Ambulatory Visit (INDEPENDENT_AMBULATORY_CARE_PROVIDER_SITE_OTHER): Payer: Medicare Other | Admitting: Family Medicine

## 2013-07-06 ENCOUNTER — Encounter: Payer: Self-pay | Admitting: Family Medicine

## 2013-07-06 VITALS — BP 192/80 | HR 86 | Temp 98.1°F | Wt 167.8 lb

## 2013-07-06 DIAGNOSIS — J069 Acute upper respiratory infection, unspecified: Secondary | ICD-10-CM

## 2013-07-06 MED ORDER — AMOXICILLIN-POT CLAVULANATE 875-125 MG PO TABS: 1.0000 | ORAL_TABLET | Freq: Two times a day (BID) | ORAL | Status: DC

## 2013-07-06 NOTE — Assessment & Plan Note (Signed)
Would treat given the duration.  Mucinex, (not -D), augmentin and f/u prn.  dw pt.  Supportive care o/w.  He agrees.

## 2013-07-06 NOTE — Patient Instructions (Signed)
Take plain mucinex (not mucinex D).  Start the antibiotics today.  This should gradually improve.  Take care.

## 2013-07-06 NOTE — Progress Notes (Signed)
Pre-visit discussion using our clinic review tool. No additional management support is needed unless otherwise documented below in the visit note.  Cold sx.  Started about 10 days ago.  Had been out working in the yard, blowing leaves.  Initially with frontal pain, ST, post nasal gtt. Yellowish discharge. No fevers known.  Voice is altered.  Sleeping propped up helps with post nasal gtt. Frequent throat clearing.  Taking plain mucinex but no decongestants.  Compliant with BP meds.  Had a flu shot already and a PNA shot.  He doesn't think he is getting better or worse.    BP has been variable but lower at home.  He has a cuff at home to check it.  Recheck BP 160/80.  Meds, vitals, and allergies reviewed.   ROS: See HPI.  Otherwise, noncontributory.  GEN: nad, alert and oriented HEENT: mucous membranes moist, tm w/o erythema, nasal exam w/o erythema, clear discharge noted,  OP with cobblestoning NECK: supple w/o LA CV: rrr.   PULM: ctab, no inc wob EXT: no edema SKIN: no acute rash

## 2013-07-12 ENCOUNTER — Other Ambulatory Visit: Payer: Self-pay | Admitting: Family Medicine

## 2013-07-12 DIAGNOSIS — Z125 Encounter for screening for malignant neoplasm of prostate: Secondary | ICD-10-CM

## 2013-07-12 DIAGNOSIS — I10 Essential (primary) hypertension: Secondary | ICD-10-CM

## 2013-07-23 ENCOUNTER — Other Ambulatory Visit (INDEPENDENT_AMBULATORY_CARE_PROVIDER_SITE_OTHER): Payer: Medicare Other

## 2013-07-23 DIAGNOSIS — Z125 Encounter for screening for malignant neoplasm of prostate: Secondary | ICD-10-CM

## 2013-07-23 DIAGNOSIS — I10 Essential (primary) hypertension: Secondary | ICD-10-CM

## 2013-07-23 LAB — COMPREHENSIVE METABOLIC PANEL
ALT: 16 U/L (ref 0–53)
AST: 24 U/L (ref 0–37)
Albumin: 4.2 g/dL (ref 3.5–5.2)
Alkaline Phosphatase: 47 U/L (ref 39–117)
BUN: 16 mg/dL (ref 6–23)
Calcium: 9 mg/dL (ref 8.4–10.5)
Chloride: 104 mEq/L (ref 96–112)
Creatinine, Ser: 1 mg/dL (ref 0.4–1.5)
Potassium: 3.9 mEq/L (ref 3.5–5.1)
Sodium: 142 mEq/L (ref 135–145)
Total Protein: 7.4 g/dL (ref 6.0–8.3)

## 2013-07-23 LAB — PSA, MEDICARE: PSA: 0 ng/ml — ABNORMAL LOW (ref 0.10–4.00)

## 2013-07-23 LAB — LIPID PANEL
LDL Cholesterol: 79 mg/dL (ref 0–99)
Total CHOL/HDL Ratio: 3
Triglycerides: 54 mg/dL (ref 0.0–149.0)

## 2013-07-24 ENCOUNTER — Telehealth: Payer: Self-pay

## 2013-07-24 NOTE — Telephone Encounter (Signed)
Pt left message stating that he was switched from amodipine to lisinopril on 04/22/13. Reports that his BP has been running high and he is interested in switching back to the amlodipine.  Spoke w/ pt.  He reports that since changing to the lisinopril, he feels better but BP has been running around 140 systolic. On further questioning, pt admits that he has had a sinus infection recently and takes BP at different times of the day, usually after he has been active.  Advised pt to monitor BP this weekend before he starts his daily activities.  Pt to call Monday if BP continues to be elevated.  Otherwise, he will keep his appt in Jan.

## 2013-07-30 ENCOUNTER — Ambulatory Visit (INDEPENDENT_AMBULATORY_CARE_PROVIDER_SITE_OTHER): Payer: Medicare Other | Admitting: Family Medicine

## 2013-07-30 ENCOUNTER — Encounter: Payer: Self-pay | Admitting: Family Medicine

## 2013-07-30 VITALS — BP 162/88 | HR 81 | Temp 98.0°F | Ht 72.0 in | Wt 168.8 lb

## 2013-07-30 DIAGNOSIS — E785 Hyperlipidemia, unspecified: Secondary | ICD-10-CM

## 2013-07-30 DIAGNOSIS — I7100 Dissection of unspecified site of aorta: Secondary | ICD-10-CM

## 2013-07-30 DIAGNOSIS — I10 Essential (primary) hypertension: Secondary | ICD-10-CM

## 2013-07-30 DIAGNOSIS — Z0000 Encounter for general adult medical examination without abnormal findings: Secondary | ICD-10-CM

## 2013-07-30 MED ORDER — LISINOPRIL 20 MG PO TABS: 30.0000 mg | ORAL_TABLET | Freq: Every day | ORAL | Status: DC

## 2013-07-30 NOTE — Assessment & Plan Note (Signed)
Per [facility] clinic.  

## 2013-07-30 NOTE — Assessment & Plan Note (Signed)
Inc lisinopril to 30mg  a day.  If BP controlled, will recheck BMET here.  If not controlled on home checks, will inc to 40mg  and then he'll be due for f/u with cards- may need to get BMET done at that point.  He'll notify me and/or cards clinic.  Okay for outpatient f/u.

## 2013-07-30 NOTE — Assessment & Plan Note (Signed)
Controlled, continue as is.  Labs d/w pt.  

## 2013-07-30 NOTE — Patient Instructions (Signed)
Take 30mg  of lisinopril for about 7-10 days.  If BP is controlled, notify us and we'll set up a repeat lab visit.  If still elevated, then take 40mg  a day.  Take care.  Glad to see you.

## 2013-07-30 NOTE — Progress Notes (Signed)
Pre-visit discussion using our clinic review tool. No additional management support is needed unless otherwise documented below in the visit note.  I have personally reviewed the Medicare Annual Wellness questionnaire and have noted 1. The patient's medical and social history 2. Their use of alcohol, tobacco or illicit drugs 3. Their current medications and supplements 4. The patient's functional ability including ADL's, fall risks, home safety risks and hearing or visual             impairment. 5. Diet and physical activities 6. Evidence for depression or mood disorders  The patients weight, height, BMI have been recorded in the chart and visual acuity is per eye clinic.  I have made referrals, counseling and provided education to the patient based review of the above and I have provided the pt with a written personalized care plan for preventive services.  See scanned forms.  Routine anticipatory guidance given to patient.  See health maintenance. Flu 2014 Shingles 2008 PNA 2010 Tetanus 2005 Colonoscopy 2014 Prostate cancer screening- PSA still 0 Advance directive- wife designated if incapacitated. Cognitive function addressed- see scanned forms- and if abnormal then additional documentation follows.   Hypertension:    Using medication without problems or lightheadedness: yes Chest pain with exertion:no Edema:no Short of breath:no  Elevated Cholesterol: Using medications without problems:yes Muscle aches: no Diet compliance:yes Exercise:yes  Followed at [facility] for his aortic repair.    BCC on forehead removed by derm recently.   PMH and SH reviewed  Meds, vitals, and allergies reviewed.   ROS: See HPI.  Otherwise negative.    GEN: nad, alert and oriented HEENT: mucous membranes moist NECK: supple w/o LA CV: rrr. PULM: ctab, no inc wob ABD: soft, +bs EXT: no edema SKIN: no acute rash Possible ganglion on the plantar side of L foot, not ttp now.

## 2013-07-30 NOTE — Assessment & Plan Note (Signed)
See scanned forms.  Routine anticipatory guidance given to patient.  See health maintenance. Flu 2014 Shingles 2008 PNA 2010 Tetanus 2005 Colonoscopy 2014 Prostate cancer screening- PSA still 0 Advance directive- wife designated if incapacitated. Cognitive function addressed- see scanned forms- and if abnormal then additional documentation follows.

## 2013-08-18 ENCOUNTER — Encounter: Payer: Self-pay | Admitting: Cardiovascular Disease

## 2013-08-18 ENCOUNTER — Ambulatory Visit (INDEPENDENT_AMBULATORY_CARE_PROVIDER_SITE_OTHER): Payer: Medicare Other | Admitting: Cardiovascular Disease

## 2013-08-18 VITALS — BP 182/80 | HR 72 | Ht 72.0 in | Wt 168.0 lb

## 2013-08-18 DIAGNOSIS — I7100 Dissection of unspecified site of aorta: Secondary | ICD-10-CM

## 2013-08-18 DIAGNOSIS — E785 Hyperlipidemia, unspecified: Secondary | ICD-10-CM

## 2013-08-18 DIAGNOSIS — I4949 Other premature depolarization: Secondary | ICD-10-CM

## 2013-08-18 DIAGNOSIS — I4892 Unspecified atrial flutter: Secondary | ICD-10-CM

## 2013-08-18 DIAGNOSIS — I10 Essential (primary) hypertension: Secondary | ICD-10-CM

## 2013-08-18 MED ORDER — AMLODIPINE BESYLATE 2.5 MG PO TABS: 2.5000 mg | ORAL_TABLET | Freq: Two times a day (BID) | ORAL | Status: DC

## 2013-08-18 NOTE — Assessment & Plan Note (Signed)
Cholesterol is at goal on the current lipid regimen. No changes to the medications were made.  

## 2013-08-18 NOTE — Assessment & Plan Note (Signed)
Followed at [facility], last CT scan September 2014, per the notes appears stable

## 2013-08-18 NOTE — Assessment & Plan Note (Addendum)
Blood pressure appears to be his major issue today. Measurements suggest higher pressure on the right with systolics greater than 580. We discussed various medication options. He we'll restart amlodipine 2.5 mg daily, with quick titration up to 2.5 mg twice a day if he continues to run high. We'll try to keep in close medication with him over the next week until blood pressure has improved. Blood pressure does appear to be mildly depressed on the left possibly from postsurgical changes. He did not think the lisinopril is helping, in fact thinks it is making his pressure go higher. We'll decrease the lisinopril back to 20 mg daily. If blood pressure improved, he would like to wean down even further possibly down to 10 mg daily.  Other options for blood pressure include isosorbide, hydralazine, losartan, clonidine, Cardura

## 2013-08-18 NOTE — Progress Notes (Signed)
Patient ID: Ray Fowler, male    DOB: Dec 09, 1940, 73 y.o.   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 Post-op course complicated by persistent HTN and atrial flutter treated with amio (stopped in January 2011).  He has followup with cardiothoracic surgery at [facility] on an annual basis with CT scan surveillance. 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   Prior ECHO showed EF 45-50%. Had cardiac CT in 12/10 after abnormal myvoiew. Coronaries normal except for 25% RCA. 85mm stable pulmonary nodule.    echo (02/2010) EF 55-60% with grade 1 diastolic dysfx and previous AO dissection flap.  In followup today, overall he feels well though does have severe hypertension. He has been checking his blood pressure periodically and reports is running very high. He has increased his lisinopril from 10 mg, up to 20 mg, now to 40 mg in the past week with no improvement of his blood pressure. He feels the lisinopril was making his blood pressure worse.   pneumonia in November 2012 with chest x-ray showing dilated aorta. 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  he was referred to [facility] for surgery. 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  followup at [facility] in September 2013 with repeat CT scan confirming  stable arch and descending aorta.  He was unable to tolerate lisinopril as blood pressure was labile and was to low. He continues to have labile pressures tolerating amlodipine 5 mg daily and metoprolol twice a day. Very active at baseline, able to do most of his yard work. Some muscle cramping at times across his abdomen and back.    EKG shows normal sinus rhythm with 72 beats per minute, nonspecific ST abnormality in lead V4 through V6, 2, 3, aVF   Outpatient Encounter Prescriptions as of 08/18/2013  Medication Sig  . aspirin 81 MG tablet Take one tablet every other day.  Marland Kitchen atorvastatin (LIPITOR) 10 MG tablet Take one tablet by mouth   nightly at bedtime  . fenofibrate (TRICOR) 145 MG tablet TAKE ONE TABLET BY MOUTH ONE TIME DAILY  . FLOVENT HFA 110 MCG/ACT inhaler INHALE ONE PUFF BY MOUTH TWICE DAILY RINSE MOUTH AFTER USE.  . fluticasone (FLONASE) 50 MCG/ACT nasal spray USE TWO SPRAYS IN EACH NOSTRIL DAILY  . guaiFENesin (MUCINEX) 600 MG 12 hr tablet Take by mouth 2 (two) times daily as needed.  Marland Kitchen lisinopril (PRINIVIL,ZESTRIL) 20 MG tablet Take 1.5 tablets (30 mg total) by mouth daily.  . metoprolol tartrate (LOPRESSOR) 25 MG tablet TAKE ONE TABLET BY MOUTH TWICE DAILY  . Multiple Vitamin (MULITIVITAMIN WITH MINERALS) TABS Take 1 tablet by mouth daily.  . Multiple Vitamins-Minerals (MACULAR VITAMIN BENEFIT) TABS Take 1 tablet by mouth daily. Lutein 25 mg and Zeaxanthin 5 mg     Review of Systems  Constitutional: Negative.  Labile blood pressures  HENT: Negative.   Eyes: Negative.   Respiratory: Negative.   Cardiovascular: Negative.   Gastrointestinal: Negative.   Endocrine: Negative.   Musculoskeletal: Negative.   Skin: Negative.   Allergic/Immunologic: Negative.   Neurological: Negative.   Hematological: Negative.   Psychiatric/Behavioral: Negative.   All other systems reviewed and are negative.   BP 182/80  Pulse 72  Ht 6' (1.829 m)  Wt 168 lb (76.204 kg)  BMI  22.78 kg/m2 In the office the blood pressure is higher on the right, more pronounced pulse auscultated. Left  extremity is lower and heart are to auscultate.  Physical Exam  Nursing note and vitals reviewed. Constitutional: He is oriented to person, place, and time. He appears well-developed and well-nourished.  HENT:  Head: Normocephalic.  Nose: Nose normal.  Mouth/Throat: Oropharynx is clear and moist.  Eyes: Conjunctivae are normal. Pupils are equal, round, and reactive to light.  Neck: Normal range of motion. Neck supple. No JVD present.  Cardiovascular: Normal rate, regular rhythm, S1 normal, S2 normal and intact distal pulses.  Exam reveals no gallop and no friction rub.   Murmur heard.  Crescendo systolic murmur is present with a grade of 2/6  Pulmonary/Chest: Effort normal and breath sounds normal. No respiratory distress. He has no wheezes. He has no rales. He exhibits no tenderness.  Abdominal: Soft. Bowel sounds are normal. He exhibits no distension. There is no tenderness.  Musculoskeletal: Normal range of motion. He exhibits no edema and no tenderness.  Well-healed sternotomy scar  Lymphadenopathy:    He has no cervical adenopathy.  Neurological: He is alert and oriented to person, place, and time. Coordination normal.  Skin: Skin is warm and dry. No rash noted. No erythema.  Psychiatric: He has a normal mood and affect. His behavior is normal. Judgment and thought content normal.      Assessment and Plan

## 2013-08-18 NOTE — Patient Instructions (Addendum)
Blood pressure is high today Please start amlodipine 2.5 mg in the morning (1/2 of the 5 mg pill) If  Blood pressure continues to run high, take amlodipine 2.5 mg twice a day Decrease the lisinopril down to 20 mg daily  Please call us if you have new issues that need to be addressed before your next appt.  Your physician wants you to follow-up in: 1 month.

## 2013-08-20 ENCOUNTER — Other Ambulatory Visit: Payer: Self-pay | Admitting: Cardiovascular Disease

## 2013-08-22 ENCOUNTER — Other Ambulatory Visit: Payer: Self-pay | Admitting: Family Medicine

## 2013-09-22 ENCOUNTER — Other Ambulatory Visit: Payer: Self-pay | Admitting: Family Medicine

## 2013-09-23 ENCOUNTER — Encounter: Payer: Self-pay | Admitting: Cardiovascular Disease

## 2013-09-23 ENCOUNTER — Ambulatory Visit (INDEPENDENT_AMBULATORY_CARE_PROVIDER_SITE_OTHER): Payer: Medicare Other | Admitting: Cardiovascular Disease

## 2013-09-23 VITALS — BP 142/80 | HR 74 | Ht 72.0 in | Wt 169.2 lb

## 2013-09-23 DIAGNOSIS — I10 Essential (primary) hypertension: Secondary | ICD-10-CM

## 2013-09-23 DIAGNOSIS — I7100 Dissection of unspecified site of aorta: Secondary | ICD-10-CM

## 2013-09-23 DIAGNOSIS — I4949 Other premature depolarization: Secondary | ICD-10-CM

## 2013-09-23 DIAGNOSIS — I4892 Unspecified atrial flutter: Secondary | ICD-10-CM

## 2013-09-23 NOTE — Assessment & Plan Note (Signed)
Followed at [facility] 

## 2013-09-23 NOTE — Progress Notes (Signed)
Patient ID: Ray Fowler, male    DOB: 06/15/1941, 73 y.o.   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 Post-op course complicated by persistent HTN and atrial flutter treated with amio (stopped in January 2011).  He has followup with cardiothoracic surgery at [facility] on an annual basis with CT scan surveillance. 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   Prior ECHO showed EF 45-50%. Had cardiac CT in 12/10 after abnormal myvoiew. Coronaries normal except for 25% RCA. 69mm stable pulmonary nodule.    echo (02/2010) EF 55-60% with grade 1 diastolic dysfx and previous AO dissection flap.  In followup today, he has decrease the lisinopril down to 10 mg daily. He has been taking amlodipine 2.5 mg twice a day. Blood pressure has significantly improved, good numbers over the past week. He would like to hold the lisinopril as he does not feel that this is working. He is afraid of increasing the dose of amlodipine as previously this caused dizziness. He Otherwise he feels well with no complaints  pneumonia in November 2012 with chest x-ray showing dilated aorta. 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  he was referred to [facility] for surgery. 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  followup at  [facility] in September 2013 with repeat CT scan confirming stable arch and descending aorta.  He was unable to tolerate lisinopril as blood pressure was labile and was to low. He continues to have labile pressures tolerating amlodipine 5 mg daily and metoprolol twice a day. Very active at baseline, able to do most of his yard work. Some muscle cramping at times across his abdomen and back.    EKG shows normal sinus rhythm with 74 beats per minute, nonspecific ST abnormality in lead V4 through V6, 2, 3, aVF, rare APC    Outpatient Encounter Prescriptions as of 09/23/2013  Medication Sig  . amLODipine (NORVASC) 2.5 MG tablet Take 1 tablet (2.5 mg total) by mouth 2 (two) times daily.  Marland Kitchen aspirin 81 MG tablet Take one tablet every other day.  Marland Kitchen atorvastatin (LIPITOR) 10 MG tablet Take one tablet by mouth   nightly at bedtime  . fenofibrate (TRICOR) 145 MG tablet TAKE ONE TABLET BY MOUTH ONE TIME DAILY  . FLOVENT HFA 110 MCG/ACT inhaler INHALE ONE PUFF BY MOUTH TWICE DAILY RINSE MOUTH AFTER USE.  . fluticasone (FLONASE) 50 MCG/ACT nasal spray Use two sprays each nostril every day  . guaiFENesin (MUCINEX) 600 MG 12 hr tablet Take by mouth 2 (two) times daily as needed.  Marland Kitchen lisinopril (PRINIVIL,ZESTRIL) 20 MG tablet Take 10 mg by mouth daily.  . metoprolol tartrate (LOPRESSOR) 25 MG tablet TAKE ONE TABLET BY MOUTH TWICE DAILY   . Multiple Vitamin (MULITIVITAMIN WITH MINERALS) TABS Take 1 tablet by mouth daily.  . Multiple Vitamins-Minerals (MACULAR VITAMIN BENEFIT) TABS Take 1  tablet by mouth daily. Lutein 25 mg and Zeaxanthin 5 mg  . [DISCONTINUED] lisinopril (PRINIVIL,ZESTRIL) 20 MG tablet Take 1.5 tablets (30 mg total) by mouth daily.     Review of Systems  Constitutional: Negative.        Labile blood pressures  HENT: Negative.   Eyes: Negative.   Respiratory: Negative.   Cardiovascular: Negative.   Gastrointestinal: Negative.   Endocrine: Negative.   Musculoskeletal: Negative.   Skin:  Negative.   Allergic/Immunologic: Negative.   Neurological: Negative.   Hematological: Negative.   Psychiatric/Behavioral: Negative.   All other systems reviewed and are negative.   BP 142/80  Pulse 74  Ht 6' (1.829 m)  Wt 169 lb 4 oz (76.771 kg)  BMI 22.95 kg/m2  Physical Exam  Nursing note and vitals reviewed. Constitutional: He is oriented to person, place, and time. He appears well-developed and well-nourished.  HENT:  Head: Normocephalic.  Nose: Nose normal.  Mouth/Throat: Oropharynx is clear and moist.  Eyes: Conjunctivae are normal. Pupils are equal, round, and reactive to light.  Neck: Normal range of motion. Neck supple. No JVD present.  Cardiovascular: Normal rate, regular rhythm, S1 normal, S2 normal and intact distal pulses.  Exam reveals no gallop and no friction rub.   Murmur heard.  Crescendo systolic murmur is present with a grade of 2/6  Pulmonary/Chest: Effort normal and breath sounds normal. No respiratory distress. He has no wheezes. He has no rales. He exhibits no tenderness.  Abdominal: Soft. Bowel sounds are normal. He exhibits no distension. There is no tenderness.  Musculoskeletal: Normal range of motion. He exhibits no edema and no tenderness.  Well-healed sternotomy scar  Lymphadenopathy:    He has no cervical adenopathy.  Neurological: He is alert and oriented to person, place, and time. Coordination normal.  Skin: Skin is warm and dry. No rash noted. No erythema.  Psychiatric: He has a normal mood and affect. His behavior is normal. Judgment and thought content normal.      Assessment and Plan

## 2013-09-23 NOTE — Assessment & Plan Note (Signed)
He prefers to hold the lisinopril as he does not feel that this is working for him. We have suggested he stop the lisinopril, monitor his blood pressure. If blood pressure at rest runs high, he can add additional amlodipine 2.5 mg pills. He might need 2.5 mg in the morning, 5 mg at night or potentially 5 mg twice a day

## 2013-09-23 NOTE — Patient Instructions (Signed)
You are doing well. Hold the lisinopril Increase amlodipine if needed to keep blood pressure in control  Please call us if you have new issues that need to be addressed before your next appt.  Your physician wants you to follow-up in: 6 months.  You will receive a reminder letter in the mail two months in advance. If you don't receive a letter, please call our office to schedule the follow-up appointment.

## 2013-09-23 NOTE — Telephone Encounter (Signed)
Would continue.  Thanks.

## 2013-09-23 NOTE — Telephone Encounter (Signed)
Received refill request electronically. See medication warning with Atorvastatin. Is it okay to refill medication?

## 2014-02-22 ENCOUNTER — Other Ambulatory Visit: Payer: Self-pay | Admitting: Family Medicine

## 2014-03-22 ENCOUNTER — Other Ambulatory Visit: Payer: Self-pay | Admitting: Cardiovascular Disease

## 2014-03-23 ENCOUNTER — Encounter: Payer: Self-pay | Admitting: Cardiovascular Disease

## 2014-03-23 ENCOUNTER — Ambulatory Visit (INDEPENDENT_AMBULATORY_CARE_PROVIDER_SITE_OTHER): Payer: Medicare Other | Admitting: Cardiovascular Disease

## 2014-03-23 VITALS — BP 160/90 | HR 68 | Ht 72.0 in | Wt 169.5 lb

## 2014-03-23 DIAGNOSIS — E785 Hyperlipidemia, unspecified: Secondary | ICD-10-CM

## 2014-03-23 DIAGNOSIS — I10 Essential (primary) hypertension: Secondary | ICD-10-CM

## 2014-03-23 DIAGNOSIS — I7100 Dissection of unspecified site of aorta: Secondary | ICD-10-CM

## 2014-03-23 DIAGNOSIS — I2510 Atherosclerotic heart disease of native coronary artery without angina pectoris: Secondary | ICD-10-CM

## 2014-03-23 DIAGNOSIS — I498 Other specified cardiac arrhythmias: Secondary | ICD-10-CM

## 2014-03-23 DIAGNOSIS — I4902 Ventricular flutter: Secondary | ICD-10-CM

## 2014-03-23 NOTE — Patient Instructions (Signed)
You are doing well. No medication changes were made.  Please call us if you have new issues that need to be addressed before your next appt.  Your physician wants you to follow-up in: 6 months.  You will receive a reminder letter in the mail two months in advance. If you don't receive a letter, please call our office to schedule the follow-up appointment.   

## 2014-03-23 NOTE — Assessment & Plan Note (Signed)
Cholesterol is at goal on the current lipid regimen. No changes to the medications were made.  

## 2014-03-23 NOTE — Assessment & Plan Note (Signed)
Currently with no symptoms of angina. No further workup at this time. Continue current medication regimen. 

## 2014-03-23 NOTE — Assessment & Plan Note (Addendum)
He would like to stay on amlodipine 2.5 mg daily. Blood pressure with is within adequate range. We have suggested if it trends higher, that he take additional amlodipine 2.5 mg total for a total of 5 mg daily

## 2014-03-23 NOTE — Progress Notes (Signed)
Patient ID: Ray Fowler, male    DOB: 06/23/1941, 73 y.o.   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 Post-op course complicated by persistent HTN and atrial flutter treated with amio (stopped in January 2011).  He has followup with cardiothoracic surgery at [facility] on an annual basis with CT scan surveillance.  CT September 2014 showing remodeling of his thoracic aorta, overall looked good/stable. He presents for routine followup  He reports that he is been taking amlodipine 2.5 mg daily, is now not taking lisinopril On this regimen his blood pressure ranges between 120 and 140s, rarely 150 Typically it is elevated after activity and improves with rest into the 130s Is concerned about using more amlodipine as he was taking previously as he was having dizziness, nausea He denies having any symptoms at this time and feels well. They're planning a trip to [place] in 2 months time  Prior ECHO showed EF 45-50%. Had cardiac CT in 12/10 after abnormal myvoiew. Coronaries normal except for 25% RCA. 23mm stable pulmonary nodule.    echo (02/2010) EF 55-60% with grade 1 diastolic dysfx and previous AO dissection flap.  pneumonia in November 2012 with chest x-ray showing dilated aorta. 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  he was referred to [facility] for surgery. He had an arch  branching procedure involving neo-ascending aorta to the right common carotid and right subclavian arteries individually, and branch to the left common carotid artery, concomittant left carotid to the subclavian artery bypass in the neck with endovascular exclusion of the aneurysm with endograft to extend from the hemi-arch back  autograft down to the distal descending thoracic aorta.   EKG shows normal sinus rhythm with 71 beats per minute, nonspecific ST abnormality in lead V4 through V6, 2, 3, aVF, rare APC    Outpatient Encounter Prescriptions as of 03/23/2014  Medication Sig  . amLODipine (NORVASC) 2.5 MG tablet Take 2.5 mg by mouth daily.  Marland Kitchen aspirin 81 MG tablet Take one tablet every other day.  Marland Kitchen atorvastatin (LIPITOR) 10 MG tablet Take one tablet by mouth   nightly at bedtime  . fenofibrate (TRICOR) 145 MG tablet TAKE ONE TABLET BY MOUTH ONE TIME DAILY   . FLOVENT HFA 110 MCG/ACT inhaler INHALE ONE PUFF BY MOUTH TWICE DAILY **RINSE MOUTH AFTER USE**  . fluticasone (FLONASE) 50 MCG/ACT nasal spray Use two sprays each nostril every day  . guaiFENesin (MUCINEX) 600 MG 12 hr tablet Take by mouth 2 (two) times daily as needed.  . metoprolol tartrate (LOPRESSOR) 25 MG tablet TAKE ONE TABLET BY MOUTH TWICE DAILY   . Multiple Vitamin (MULITIVITAMIN WITH MINERALS) TABS Take 1 tablet by mouth daily.  . Multiple Vitamins-Minerals (MACULAR VITAMIN BENEFIT) TABS Take 1 tablet by mouth daily. Lutein 25 mg and Zeaxanthin 5 mg    Review of Systems  Constitutional: Negative.   HENT: Negative.   Eyes: Negative.   Respiratory: Negative.   Cardiovascular: Negative.   Gastrointestinal: Negative.   Endocrine: Negative.   Musculoskeletal: Negative.   Skin: Negative.   Allergic/Immunologic: Negative.   Neurological: Negative.   Hematological: Negative.   Psychiatric/Behavioral: Negative.   All other systems reviewed and are negative.  BP  160/90  Pulse 68  Ht 6' (1.829 m)  Wt 169 lb 8 oz (76.885 kg)  BMI 22.98 kg/m2  Physical Exam  Nursing note and vitals reviewed. Constitutional: He is oriented to person, place, and time. He appears well-developed and well-nourished.  HENT:  Head: Normocephalic.  Nose: Nose normal.  Mouth/Throat: Oropharynx is clear and moist.  Eyes: Conjunctivae are normal. Pupils are equal,  round, and reactive to light.  Neck: Normal range of motion. Neck supple. No JVD present.  Cardiovascular: Normal rate, regular rhythm, S1 normal, S2 normal and intact distal pulses.  Exam reveals no gallop and no friction rub.   Murmur heard.  Crescendo systolic murmur is present with a grade of 2/6  Pulmonary/Chest: Effort normal and breath sounds normal. No respiratory distress. He has no wheezes. He has no rales. He exhibits no tenderness.  Abdominal: Soft. Bowel sounds are normal. He exhibits no distension. There is no tenderness.  Musculoskeletal: Normal range of motion. He exhibits no edema and no tenderness.  Well-healed sternotomy scar  Lymphadenopathy:    He has no cervical adenopathy.  Neurological: He is alert and oriented to person, place, and time. Coordination normal.  Skin: Skin is warm and dry. No rash noted. No erythema.  Psychiatric: He has a normal mood and affect. His behavior is normal. Judgment and thought content normal.      Assessment and Plan

## 2014-03-23 NOTE — Assessment & Plan Note (Signed)
Annual CT scan done at [facility]. He is scheduled to followup at [facility] in September 2015

## 2014-03-31 ENCOUNTER — Other Ambulatory Visit: Payer: Self-pay | Admitting: Family Medicine

## 2014-03-31 NOTE — Telephone Encounter (Signed)
Received refill request electronically from pharmacy. See warning on fenofibrate and atorvastatin. Is it okay to refill medication?

## 2014-04-01 ENCOUNTER — Other Ambulatory Visit: Payer: Self-pay | Admitting: *Deleted

## 2014-04-01 MED ORDER — ATORVASTATIN CALCIUM 10 MG PO TABS: ORAL_TABLET | ORAL | Status: DC

## 2014-04-01 NOTE — Telephone Encounter (Signed)
Okay to continue.  Sent.  Thanks. 

## 2014-04-15 ENCOUNTER — Ambulatory Visit (INDEPENDENT_AMBULATORY_CARE_PROVIDER_SITE_OTHER): Payer: Medicare Other

## 2014-04-15 DIAGNOSIS — Z23 Encounter for immunization: Secondary | ICD-10-CM

## 2014-06-04 ENCOUNTER — Encounter: Payer: Self-pay | Admitting: Family Medicine

## 2014-06-04 ENCOUNTER — Ambulatory Visit (INDEPENDENT_AMBULATORY_CARE_PROVIDER_SITE_OTHER): Payer: Medicare Other | Admitting: Family Medicine

## 2014-06-04 VITALS — BP 142/78 | HR 72 | Temp 98.0°F | Wt 166.5 lb

## 2014-06-04 DIAGNOSIS — H8110 Benign paroxysmal vertigo, unspecified ear: Secondary | ICD-10-CM

## 2014-06-04 DIAGNOSIS — R059 Cough, unspecified: Secondary | ICD-10-CM

## 2014-06-04 DIAGNOSIS — R05 Cough: Secondary | ICD-10-CM

## 2014-06-04 NOTE — Progress Notes (Signed)
Pre visit review using our clinic review tool, if applicable. No additional management support is needed unless otherwise documented below in the visit note.  He has had recurrent vertigo, would like to see ENT.  He does have room spinning ("it can go one way, then it can spin the other"), not just light headed, with movement.  He already cut back on the amlodipine and that helped some, but it didn't resolve.  His BP at rest is usually ~962 systolic.  If he's lower than that, he feels fatigued.    "Sinus trouble."  Recently went to [place] and [place].  About 3-4 days after the trip, had more rhinorrhea, burning in his throat.  Post nasal gtt.  Sinus sx are some better today.  He has used a mask with yard work.  Less drainage now.  Still with a "nasal" voice.  Cough is better.  Prev with some ticking/dry cough in his throat.  No fevers. No sputum with the cough. Some better today.   ROS: See HPI.  Otherwise negative.    Meds, vitals, and allergies reviewed.   GEN: nad, alert and oriented HEENT: mucous membranes moist, TM w/o erythema, nasal epithelium injected, OP with cobblestoning NECK: supple w/o LA CV: rrr. PULM: ctab except for very scant faint rhonchi that clear with a cough, no inc wob ABD: soft, +bs EXT: no edema

## 2014-06-04 NOTE — Patient Instructions (Signed)
Take plain mucinex with a lot of water.  This should get better.  Take care.

## 2014-06-06 DIAGNOSIS — R059 Cough, unspecified: Secondary | ICD-10-CM | POA: Insufficient documentation

## 2014-06-06 DIAGNOSIS — H8110 Benign paroxysmal vertigo, unspecified ear: Secondary | ICD-10-CM | POA: Insufficient documentation

## 2014-06-06 DIAGNOSIS — R05 Cough: Secondary | ICD-10-CM | POA: Insufficient documentation

## 2014-06-06 NOTE — Assessment & Plan Note (Signed)
Refer to ENT

## 2014-06-06 NOTE — Assessment & Plan Note (Signed)
See exam, nontoxic, improving today. Mucinex, plenty of fluids, f/u prn.  He agrees. No need for imaging or abx at this point.

## 2014-06-11 ENCOUNTER — Ambulatory Visit (INDEPENDENT_AMBULATORY_CARE_PROVIDER_SITE_OTHER): Payer: Medicare Other | Admitting: Family Medicine

## 2014-06-11 ENCOUNTER — Encounter: Payer: Self-pay | Admitting: Family Medicine

## 2014-06-11 VITALS — BP 136/76 | HR 59 | Temp 98.4°F | Wt 167.5 lb

## 2014-06-11 DIAGNOSIS — R059 Cough, unspecified: Secondary | ICD-10-CM

## 2014-06-11 DIAGNOSIS — R05 Cough: Secondary | ICD-10-CM

## 2014-06-11 MED ORDER — DOXYCYCLINE HYCLATE 100 MG PO TABS: 100.0000 mg | ORAL_TABLET | Freq: Two times a day (BID) | ORAL | Status: DC

## 2014-06-11 MED ORDER — BENZONATATE 200 MG PO CAPS: 200.0000 mg | ORAL_CAPSULE | Freq: Three times a day (TID) | ORAL | Status: DC | PRN

## 2014-06-11 NOTE — Progress Notes (Signed)
Pre visit review using our clinic review tool, if applicable. No additional management support is needed unless otherwise documented below in the visit note.  He has f/u with ENT, he may have to push that visit back.    He has had continued waxing and waning sx in the meantime since the last OV.  Still with sputum, cough, more than prev.  Now more URI sx, not much maxillary pain but frontal pain noted.  Sleeping on the cough, propped up, to help with the cough.  No fevers until last night, none this AM.    Meds, vitals, and allergies reviewed.   ROS: See HPI.  Otherwise, noncontributory.  GEN: nad, alert and oriented HEENT: mucous membranes moist, tm w/o erythema, nasal exam w/o erythema, clear discharge noted,  OP with cobblestoning NECK: supple w/o LA CV: rrr.   PULM: ctab, no inc wob, coarse BS noted, but no focal dec in BS EXT: no edema SKIN: no acute rash

## 2014-06-11 NOTE — Patient Instructions (Signed)
Tessalon for cough, start the doxycyline today and update [place] as needed.  Take care.  Glad to see you.

## 2014-06-13 NOTE — Assessment & Plan Note (Signed)
Presumed bronchitis, nontoxic, start doxy and use tessalon prn for cough.  F/u prn. He agrees.

## 2014-06-25 ENCOUNTER — Other Ambulatory Visit: Payer: Self-pay | Admitting: Family Medicine

## 2014-07-12 ENCOUNTER — Other Ambulatory Visit: Payer: Self-pay | Admitting: Cardiovascular Disease

## 2014-07-29 ENCOUNTER — Other Ambulatory Visit: Payer: Self-pay | Admitting: Family Medicine

## 2014-08-09 ENCOUNTER — Ambulatory Visit (INDEPENDENT_AMBULATORY_CARE_PROVIDER_SITE_OTHER): Payer: Medicare Other | Admitting: Family Medicine

## 2014-08-09 ENCOUNTER — Encounter: Payer: Self-pay | Admitting: Family Medicine

## 2014-08-09 VITALS — BP 170/82 | HR 82 | Temp 97.4°F | Ht 70.5 in | Wt 166.0 lb

## 2014-08-09 DIAGNOSIS — I7101 Dissection of thoracic aorta: Secondary | ICD-10-CM

## 2014-08-09 DIAGNOSIS — I71019 Dissection of thoracic aorta, unspecified: Secondary | ICD-10-CM

## 2014-08-09 DIAGNOSIS — Z0000 Encounter for general adult medical examination without abnormal findings: Secondary | ICD-10-CM

## 2014-08-09 DIAGNOSIS — I10 Essential (primary) hypertension: Secondary | ICD-10-CM

## 2014-08-09 DIAGNOSIS — Z7189 Other specified counseling: Secondary | ICD-10-CM | POA: Insufficient documentation

## 2014-08-09 DIAGNOSIS — Z125 Encounter for screening for malignant neoplasm of prostate: Secondary | ICD-10-CM

## 2014-08-09 DIAGNOSIS — Z23 Encounter for immunization: Secondary | ICD-10-CM

## 2014-08-09 DIAGNOSIS — E785 Hyperlipidemia, unspecified: Secondary | ICD-10-CM

## 2014-08-09 LAB — COMPREHENSIVE METABOLIC PANEL
ALBUMIN: 4.6 g/dL (ref 3.5–5.2)
ALT: 18 U/L (ref 0–53)
AST: 27 U/L (ref 0–37)
Alkaline Phosphatase: 41 U/L (ref 39–117)
BUN: 20 mg/dL (ref 6–23)
CALCIUM: 9.3 mg/dL (ref 8.4–10.5)
CHLORIDE: 105 meq/L (ref 96–112)
CO2: 29 meq/L (ref 19–32)
Creatinine, Ser: 0.9 mg/dL (ref 0.4–1.5)
GFR: 86.97 mL/min (ref >60.00)
Glucose, Bld: 94 mg/dL (ref 70–99)
POTASSIUM: 3.9 meq/L (ref 3.5–5.1)
Sodium: 140 mEq/L (ref 135–145)
TOTAL PROTEIN: 7.4 g/dL (ref 6.0–8.3)
Total Bilirubin: 0.7 mg/dL (ref 0.2–1.2)

## 2014-08-09 LAB — LIPID PANEL
Cholesterol: 159 mg/dL (ref 0–200)
HDL: 40.4 mg/dL (ref >39.00)
LDL Cholesterol: 99 mg/dL (ref 0–99)
NonHDL: 118.6
Total CHOL/HDL Ratio: 4
Triglycerides: 100 mg/dL (ref 0.0–149.0)
VLDL: 20 mg/dL (ref 0.0–40.0)

## 2014-08-09 LAB — PSA, MEDICARE: PSA: 0 ng/ml — ABNORMAL LOW (ref 0.10–4.00)

## 2014-08-09 NOTE — Patient Instructions (Addendum)
Check with your insurance to see if they will cover the tetanus shot.  It may be cheaper at the pharmacy or health department.  Go to the lab on the way out.  We'll contact you with your lab report. Take care.  Keep exercising.  Let me know if you BP at home is consistently >140/>90. Glad to see you.

## 2014-08-09 NOTE — Assessment & Plan Note (Signed)
Continue current meds for now. See notes on labs.  He agrees.

## 2014-08-09 NOTE — Addendum Note (Signed)
Addended by: Josetta Huddle on: 08/09/2014 09:55 AM   Modules accepted: Orders

## 2014-08-09 NOTE — Assessment & Plan Note (Signed)
BP control is the main issue here, normal BP at home.  Continue as is. He likely has a white coat component.  D/w pt.

## 2014-08-09 NOTE — Progress Notes (Signed)
Pre visit review using our clinic review tool, if applicable. No additional management support is needed unless otherwise documented below in the visit note.  I have personally reviewed the Medicare Annual Wellness questionnaire and have noted 1. The patient's medical and social history 2. Their use of alcohol, tobacco or illicit drugs 3. Their current medications and supplements 4. The patient's functional ability including ADL's, fall risks, home safety risks and hearing or visual             impairment. 5. Diet and physical activities 6. Evidence for depression or mood disorders  The patients weight, height, BMI have been recorded in the chart and visual acuity is per eye clinic.  I have made referrals, counseling and provided education to the patient based review of the above and I have provided the pt with a written personalized care plan for preventive services.  Provider list updated- see scanned forms.  Routine anticipatory guidance given to patient.  See health maintenance. Flu 2015 Shingles 2008 PNA 2010 Tetanus 2005 Colon 2014 Prostate cancer screening- PSA pending.  Advance directive- wife designated if patient were incapacitated.  Cognitive function addressed- see scanned forms- and if abnormal then additional documentation follows.   Hypertension:    Using medication without problems or lightheadedness: occ gets lightheaded on quick standing.   Chest pain with exertion:no Edema:no Short of breath:no Average home BPs: has been variable, often lower than today.  BB was inc at last [place] visit.  BP usually 115-130/60-70s on home checks Other issues: has had f/u with [facility] re: aorta this fall.   Elevated Cholesterol: Using medications without problems:yes Muscle aches: not myalgias but some variable joint aches noted, episodic.  It isn't bothersome enough to stop his meds Diet compliance: yes  Exercise:yes  PMH and SH reviewed  Meds, vitals, and allergies reviewed.    ROS: See HPI.  Otherwise negative.    GEN: nad, alert and oriented HEENT: mucous membranes moist NECK: supple w/o LA CV: rrr. Murmur noted.  PULM: ctab, no inc wob ABD: soft, +bs EXT: no edema SKIN: no acute rash

## 2014-08-09 NOTE — Assessment & Plan Note (Signed)
Controlled on home checks, would continue as is.  D/w pt.  Likely a white coat component. See notes on labs.

## 2014-08-09 NOTE — Assessment & Plan Note (Signed)
Provider list updated- see scanned forms.  Routine anticipatory guidance given to patient.  See health maintenance. Flu 2015 Shingles 2008 PNA 2010 Tetanus 2005 Colon 2014 Prostate cancer screening- PSA pending.  Advance directive- wife designated if patient were incapacitated.  Cognitive function addressed- see scanned forms- and if abnormal then additional documentation follows.

## 2014-08-10 ENCOUNTER — Encounter: Payer: Self-pay | Admitting: *Deleted

## 2014-08-17 ENCOUNTER — Other Ambulatory Visit: Payer: Self-pay

## 2014-08-17 MED ORDER — METOPROLOL TARTRATE 25 MG PO TABS: ORAL_TABLET | ORAL | Status: DC

## 2014-08-17 NOTE — Telephone Encounter (Signed)
The patient is taking Metoprolol 25 mg take 2 tablets twice a day per the patient, he was given this dose at [place] back in September 2015. Please advise if this is okay to refill. He is about to be out of the medication tomorrow.

## 2014-08-18 ENCOUNTER — Other Ambulatory Visit: Payer: Self-pay

## 2014-08-18 MED ORDER — METOPROLOL TARTRATE 25 MG PO TABS: ORAL_TABLET | ORAL | Status: DC

## 2014-08-18 NOTE — Telephone Encounter (Signed)
Refill sent for metoprolol.  

## 2014-09-20 ENCOUNTER — Other Ambulatory Visit: Payer: Self-pay | Admitting: Family Medicine

## 2014-09-21 ENCOUNTER — Ambulatory Visit: Payer: Medicare Other | Admitting: Cardiovascular Disease

## 2014-09-28 ENCOUNTER — Ambulatory Visit: Payer: Medicare Other | Admitting: Cardiovascular Disease

## 2014-10-04 ENCOUNTER — Encounter: Payer: Self-pay | Admitting: Cardiovascular Disease

## 2014-10-04 ENCOUNTER — Ambulatory Visit (INDEPENDENT_AMBULATORY_CARE_PROVIDER_SITE_OTHER): Payer: Medicare Other | Admitting: Cardiovascular Disease

## 2014-10-04 VITALS — BP 162/70 | HR 71 | Ht 72.0 in | Wt 171.0 lb

## 2014-10-04 DIAGNOSIS — I4892 Unspecified atrial flutter: Secondary | ICD-10-CM

## 2014-10-04 DIAGNOSIS — E785 Hyperlipidemia, unspecified: Secondary | ICD-10-CM

## 2014-10-04 DIAGNOSIS — I359 Nonrheumatic aortic valve disorder, unspecified: Secondary | ICD-10-CM

## 2014-10-04 DIAGNOSIS — I2510 Atherosclerotic heart disease of native coronary artery without angina pectoris: Secondary | ICD-10-CM

## 2014-10-04 DIAGNOSIS — I10 Essential (primary) hypertension: Secondary | ICD-10-CM

## 2014-10-04 DIAGNOSIS — I7101 Dissection of thoracic aorta: Secondary | ICD-10-CM

## 2014-10-04 DIAGNOSIS — I71019 Dissection of thoracic aorta, unspecified: Secondary | ICD-10-CM

## 2014-10-04 NOTE — Assessment & Plan Note (Signed)
Recommended that he hold the metoprolol and start bystolic 10 mg twice a day. Suggested he call he office with his blood pressure measurements in the next week or 2 Other options include low-dose clonidine, Cardura, hydralazine Could also increase the amlodipine to 10 mg

## 2014-10-04 NOTE — Progress Notes (Signed)
Patient ID: Ray Fowler, male    DOB: April 28, 1941, 74 y.o.   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 Post-op course complicated by persistent HTN and atrial flutter treated with amio (stopped in January 2011).  He has followup with cardiothoracic surgery at [facility] on an annual basis with CT scan surveillance.  CT September 2014 showing remodeling of his thoracic aorta, overall looked good/stable. He presents for routine followup  Of his of his hypertension  He reports that blood pressure continues to run high. At rest and he is calm blood pressure 330-076 systolic, when he exerts himself and that Dr. visits systolic pressure 226, sometimes 180 He is taking amlodipine 2.5 mg twice a day. Metoprolol recently increased up to 50 mg twice a day. Blood pressure continues to run high such as on today's visit He is cautious about being overmedicated as previously had dizziness In the past he did not feel that lisinopril was doing anything for him and he stopped this Otherwise he feels well with no complaints. Imaging in August 2015 showed no progression of his disease, essentially stable  EKG on today's visit shows normal sinus rhythm with rate 71 bpm, PVCs, nonspecific ST abnormalities noted  Prior ECHO showed EF 45-50%. Had cardiac CT in 12/10 after abnormal myvoiew. Coronaries normal except for 25% RCA. 18mm stable pulmonary nodule.    echo (02/2010) EF 55-60% with grade 1 diastolic dysfx and previous AO dissection flap.  pneumonia in November 2012 with chest x-ray showing dilated aorta. 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  he was referred to [facility] for surgery. He had an arch  branching  procedure involving neo-ascending aorta to the right common carotid and right subclavian arteries individually, and branch to the left common carotid artery, concomittant left carotid to the subclavian artery bypass in the neck with endovascular exclusion of the aneurysm with endograft to extend from the hemi-arch back autograft down to the distal descending thoracic aorta.  Allergies  Allergen Reactions  . Lactose Intolerance (Gi)     Outpatient Encounter Prescriptions as of 10/04/2014  Medication Sig  . amLODipine (NORVASC) 2.5 MG tablet TAKE ONE TABLET BY MOUTH TWICE DAILY   . aspirin 81 MG tablet Take one tablet every other day.  Marland Kitchen atorvastatin (LIPITOR) 10 MG tablet TAKE ONE TABLET BY MOUTH NIGHTLY AT BEDTIME   . fenofibrate (TRICOR) 145 MG tablet TAKE ONE TABLET BY MOUTH ONE TIME DAILY  (Patient taking differently: TAKE ONE TABLET BY MOUTH ONE EVERY OTHER DAY.)  . FLOVENT HFA 110 MCG/ACT inhaler INHALE ONE PUFF BY MOUTH TWICE DAILY **RINSE MOUTH AFTER USE**  . fluticasone (FLONASE) 50 MCG/ACT nasal spray USE TWO SPRAYS IN EACH NOSTRIL DAILY   . guaiFENesin (MUCINEX) 600 MG 12 hr tablet Take by mouth 2 (two) times daily as needed.  . Multiple Vitamin (MULITIVITAMIN WITH MINERALS) TABS Take 1 tablet by mouth daily.  . Multiple Vitamins-Minerals (MACULAR VITAMIN BENEFIT) TABS Take 1 tablet by mouth daily. Lutein 25 mg and Zeaxanthin 5 mg  . [DISCONTINUED] metoprolol tartrate (LOPRESSOR) 25 MG tablet TAKE TWO TABLET BY MOUTH TWICE DAILY  . nebivolol (BYSTOLIC) 10 MG tablet Take 1 tablet (10 mg total) by mouth  2 (two) times daily.    Past Medical History  Diagnosis Date  . Diverticulosis   . HTN (hypertension)   . Allergic rhinitis   . Aortic dissection 2010    Type A  . Carotid bruit     Right. Carotid u/s 2/11 normal.  . Pulmonary nodule     Stable on CT 2/11  . Hyperlipidemia   . S/P CABG x 1   . 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 Prostate cancer   . Skin cancer     scalp 2007   . BCC (basal cell carcinoma of skin) 2014    Past Surgical History  Procedure Laterality Date  . Hemi arch replacement  03/2009 and repeat 10/2011    and AoV resuspension w/ Repl of Ascending Aorta and Hemiarch w/ Graft; Pericardial Tamponade Aortic Regurg 8/22-8/28/10;  endovascular repair of aortic dilation 2013 with  ascending aorta to L common carotid, R subclavian, R common carotid and L carotid subclavian bypass  . Prostatectomy  12/1993    Dr. Risa Grill  . Penile prosthesis implant  1996  . Flex- polyp   06/1999    at 38cm, Int/Ext Hemm divertics Fuller Plan)  . 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 [facility]  08/22-8/28/2010    Thoracic aortic dissection pericardial teamponade aortic regurg  . Colonoscopy    . Polypectomy    . Cardiac surgery  2013    at [facility]- transverse aortic arch/descending aneurysm repair s/p debranching procedure  (total of 5 Gore C-TAG devices extending from distal descending aorta to his prev placed ascending dacron graft)  . Abdominal aortic aneurysm repair  march 2013    Social History  reports that he quit smoking about 20 years ago. His smoking use included Cigarettes. He has a 25 pack-year smoking history. He has never used smokeless tobacco. He reports that he drinks about 0.5 oz of alcohol per week. He reports that he does not use illicit drugs.  Family History family history includes Diabetes in his other and other; Heart disease in his father and mother; Heart failure in his mother; Hyperlipidemia in his daughter; Hypertension in his mother; Kidney disease in his father; Kidney failure in his father; Stroke in his father. There is no history of Colon cancer, Esophageal cancer, Stomach cancer, or Rectal cancer.        Review of Systems  Constitutional: Negative.   HENT: Negative.   Respiratory: Negative.   Cardiovascular: Negative.   Gastrointestinal: Negative.   Musculoskeletal: Negative.    Skin: Negative.   Neurological: Negative.   Hematological: Negative.   Psychiatric/Behavioral: Negative.   All other systems reviewed and are negative.  BP 162/70 mmHg  Pulse 71  Ht 6' (1.829 m)  Wt 171 lb (77.565 kg)  BMI 23.19 kg/m2  Physical Exam  Constitutional: He is oriented to person, place, and time. He appears well-developed and well-nourished.  HENT:  Head: Normocephalic.  Nose: Nose normal.  Mouth/Throat: Oropharynx is clear and moist.  Eyes: Conjunctivae are normal. Pupils are equal, round, and reactive to light.  Neck: Normal range of motion. Neck supple. No JVD present.  Cardiovascular: Normal rate, regular rhythm, S1 normal, S2 normal and intact distal pulses.  Exam reveals no gallop and no friction rub.   Murmur heard.  Crescendo systolic murmur is present with a grade of 2/6  Pulmonary/Chest: Effort normal and breath sounds normal. No respiratory distress. He has no wheezes. He has no  rales. He exhibits no tenderness.  Abdominal: Soft. Bowel sounds are normal. He exhibits no distension. There is no tenderness.  Musculoskeletal: Normal range of motion. He exhibits no edema or tenderness.  Well-healed sternotomy scar  Lymphadenopathy:    He has no cervical adenopathy.  Neurological: He is alert and oriented to person, place, and time. Coordination normal.  Skin: Skin is warm and dry. No rash noted. No erythema.  Psychiatric: He has a normal mood and affect. His behavior is normal. Judgment and thought content normal.      Assessment and Plan   Nursing note and vitals reviewed.

## 2014-10-04 NOTE — Assessment & Plan Note (Signed)
Currently with no symptoms of angina. No further workup at this time. Continue current medication regimen. 

## 2014-10-04 NOTE — Assessment & Plan Note (Signed)
He has close follow-up with vascular surgery. Strongly recommended more aggressive blood pressure control

## 2014-10-04 NOTE — Assessment & Plan Note (Signed)
We'll discuss repeat echocardiogram with him on his next visit

## 2014-10-04 NOTE — Assessment & Plan Note (Signed)
Cholesterol is at goal on the current lipid regimen. No changes to the medications were made.  

## 2014-10-04 NOTE — Patient Instructions (Addendum)
For high blood pressure,  Please hold the metoprolol, Please start bystolic 10 mg tonight, Start 1 pill twice a day  Please monitor your blood pressure Please call the office if it continues to run high  Please call us if you have new issues that need to be addressed before your next appt.  Your physician wants you to follow-up in: 6 months.  You will receive a reminder letter in the mail two months in advance. If you don't receive a letter, please call our office to schedule the follow-up appointment.

## 2014-10-12 ENCOUNTER — Telehealth: Payer: Self-pay | Admitting: *Deleted

## 2014-10-12 MED ORDER — NEBIVOLOL HCL 10 MG PO TABS: 10.0000 mg | ORAL_TABLET | Freq: Two times a day (BID) | ORAL | Status: DC

## 2014-10-12 NOTE — Telephone Encounter (Signed)
Please review the Bystolic instructions with the patient because at the last office visit Dr. Rockey Situ said, take one tablet twice a day,  I'm not sure why or who told the patient to take the Bystolic three times a day. Please advise.

## 2014-10-12 NOTE — Telephone Encounter (Signed)
Pt is calling stating that we gave him bystolic and it seems to be working 10 mg each time he eats which is 3 times a day, and one at bedtime.  using the samples but will run out  tomorrow.  [facility]  30 day supply Please call patient so he knows when he can go pick up.

## 2014-10-12 NOTE — Telephone Encounter (Signed)
Left message for pt to call back  °

## 2014-10-12 NOTE — Telephone Encounter (Signed)
Spoke w/ pt.  He reports that he has been "playing" w/ his medicine, as he was "under the impression that I had to adjust this to get my blood pressure under control".  Advised pt of instructions at last ov to take Bystolic 10 mg BID and call the office if his numbers became elevated. Pt states that he takes his BP several times a day and that after working out in the yard, his SBP will be in the 150s, but after sitting, he will see it go down to the 140s and 130s. Advised pt to check his BP no more than once a day, to sit and rest for 5-10 mins before checking and take his meds as prescribed and call for changes, so that we are aware of what he is taking and how much.  He verbalizes understanding and will call if his numbers are elevated.  Refill sent in for Bystolic 10 mg BID, #03, as pt is unsure if ins will pay for 90 day supply.

## 2014-10-13 ENCOUNTER — Telehealth: Payer: Self-pay

## 2014-10-13 ENCOUNTER — Telehealth: Payer: Self-pay | Admitting: *Deleted

## 2014-10-13 MED ORDER — NEBIVOLOL HCL 20 MG PO TABS: 20.0000 mg | ORAL_TABLET | Freq: Two times a day (BID) | ORAL | Status: DC

## 2014-10-13 NOTE — Telephone Encounter (Signed)
Has a question regarding Bystolic, please call

## 2014-10-13 NOTE — Telephone Encounter (Signed)
Notified patient per Dr. Rockey Situ it will be okay to send in the Bystolic 20 mg bid but the patient is aware of instructions are to take 10 mg bid. New Rx sent to [facility] for Bystolic 20 mg twice a day.

## 2014-10-13 NOTE — Telephone Encounter (Signed)
Spoke with patient today and he mentioned that he has a limit quantity of 1 pill per day for the Bystolic10 mg. There was a Rx sent yesterday for Bystolic 10 mg BID. He mentioned that if we sent in a Bystolic 20 mg bid that his insurance would cover the medication and he could cut his pill. I mentioned to him that a prior authorization may have to be done in order to get him approved for the medication/correct dosage. Patient is aware that I can't send in Bystolic 20 mg bid or make that decision. Please advise

## 2014-10-13 NOTE — Telephone Encounter (Signed)
Patient has been approved for Bystolic 10 mg 1 tablet BID until 08/13/2015.

## 2014-10-13 NOTE — Telephone Encounter (Signed)
Yesterday we tried to sent a refill in But it had a quantity problem  We sent in bystolic for two a day on 10 mg but it has a limit. It can only be taken once a day.  If we phone in a 20 mg it has a max of two a day.  It might work without a prior [place].   Please advise.

## 2014-11-28 ENCOUNTER — Other Ambulatory Visit: Payer: Self-pay | Admitting: Cardiovascular Disease

## 2015-01-18 ENCOUNTER — Encounter: Payer: Self-pay | Admitting: Gastroenterology

## 2015-04-05 ENCOUNTER — Encounter: Payer: Self-pay | Admitting: Cardiovascular Disease

## 2015-04-05 ENCOUNTER — Ambulatory Visit (INDEPENDENT_AMBULATORY_CARE_PROVIDER_SITE_OTHER): Payer: Medicare Other | Admitting: Cardiovascular Disease

## 2015-04-05 VITALS — BP 160/70 | HR 68 | Ht 72.0 in | Wt 170.0 lb

## 2015-04-05 DIAGNOSIS — I7101 Dissection of thoracic aorta: Secondary | ICD-10-CM

## 2015-04-05 DIAGNOSIS — I10 Essential (primary) hypertension: Secondary | ICD-10-CM | POA: Diagnosis not present

## 2015-04-05 DIAGNOSIS — I4892 Unspecified atrial flutter: Secondary | ICD-10-CM | POA: Diagnosis not present

## 2015-04-05 DIAGNOSIS — H8110 Benign paroxysmal vertigo, unspecified ear: Secondary | ICD-10-CM

## 2015-04-05 DIAGNOSIS — I2510 Atherosclerotic heart disease of native coronary artery without angina pectoris: Secondary | ICD-10-CM | POA: Diagnosis not present

## 2015-04-05 DIAGNOSIS — I71019 Dissection of thoracic aorta, unspecified: Secondary | ICD-10-CM

## 2015-04-05 MED ORDER — MECLIZINE HCL 25 MG PO TABS: 25.0000 mg | ORAL_TABLET | Freq: Three times a day (TID) | ORAL | Status: DC | PRN

## 2015-04-05 MED ORDER — PROMETHAZINE HCL 25 MG PO TABS: 25.0000 mg | ORAL_TABLET | Freq: Four times a day (QID) | ORAL | Status: DC | PRN

## 2015-04-05 NOTE — Assessment & Plan Note (Signed)
Long history of benign positional vertigo, worse recently  He has seen ear nose throat  Recommended he continue to take Dramamine or meclizine ,  Nausea medication provided sometimes he has nausea vomiting

## 2015-04-05 NOTE — Assessment & Plan Note (Signed)
Labile blood pressures. He would like to continue his current regimen for now as he is leaving the country in one [place]. He is concerned amlodipine could be contributing to his vertigo We did suggest we could change this if he would like Alternative medications include clonidine, Cardura, hydralazine, etc.

## 2015-04-05 NOTE — Progress Notes (Signed)
Patient ID: Ray Fowler, male    DOB: 1941/07/17, 74 y.o.   MRN: [id number]  HPI Comments: Ray Fowler is a 74 y/o male with h/o HTN,  hyperlipidemia, Type A aortic dissection in 8/67 complicated by cardiac tamponade, CT showed extension of the dissection from the aortic root all the way down to the femoral arteries, s/p aortic root replacement of hemiarch repair and aortic valve resuspension. Post-op course complicated by persistent HTN and atrial flutter treated with amio (stopped in January 2011).  He has followup with cardiothoracic surgery at [facility] on an annual basis with CT scan surveillance.  CT September 2014 showing remodeling of his thoracic aorta, overall looked good/stable. He presents for routine followup  Of his of his hypertension  In follow-up today, he reports that his blood pressure is labile Often 619-509 systolic at home at rest, with exertion blood pressure does rise Bending over and standing up, sometimes blood pressure in the 100 teens Denies having any orthostasis symptoms He does have problems with vertigo recently, has seen ear nose throat Symptoms will happen several times per month He is concerned vertigo could be secondary to taking amlodipine. Symptoms initially started one year ago Increasing frequency of episodes recently. He is been taking Dramamine. Periodically will have nausea vomiting  EKG on today's visit shows normal sinus rhythm with rate 68 bpm, nonspecific ST abnormality  Other past medical history Prior ECHO showed EF 45-50%. Had cardiac CT in 12/10 after abnormal myvoiew. Coronaries normal except for 25% RCA. 23mm stable pulmonary nodule.    echo (02/2010) EF 55-60% with grade 1 diastolic dysfx and previous AO dissection flap.  pneumonia in November 2012 with chest x-ray showing dilated aorta. 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  he was referred to [facility] for surgery. He had an arch  branching procedure involving neo-ascending  aorta to the right common carotid and right subclavian arteries individually, and branch to the left common carotid artery, concomittant left carotid to the subclavian artery bypass in the neck with endovascular exclusion of the aneurysm with endograft to extend from the hemi-arch back autograft down to the distal descending thoracic aorta.  Allergies  Allergen Reactions  . Amlodipine     Other reaction(s): Unknown  . Lactose Intolerance (Gi)     Outpatient Encounter Prescriptions as of 04/05/2015  Medication Sig  . amLODipine (NORVASC) 2.5 MG tablet TAKE ONE TABLET BY MOUTH TWICE DAILY  . aspirin 81 MG tablet Take one tablet every other day.  Marland Kitchen atorvastatin (LIPITOR) 10 MG tablet TAKE ONE TABLET BY MOUTH NIGHTLY AT BEDTIME   . fenofibrate (TRICOR) 145 MG tablet TAKE ONE TABLET BY MOUTH ONE TIME DAILY  (Patient taking differently: TAKE ONE TABLET BY MOUTH ONE EVERY OTHER DAY.)  . FLOVENT HFA 110 MCG/ACT inhaler INHALE ONE PUFF BY MOUTH TWICE DAILY **RINSE MOUTH AFTER USE**  . fluticasone (FLONASE) 50 MCG/ACT nasal spray USE TWO SPRAYS IN EACH NOSTRIL DAILY   . guaiFENesin (MUCINEX) 600 MG 12 hr tablet Take by mouth 2 (two) times daily as needed.  . Multiple Vitamin (MULITIVITAMIN WITH MINERALS) TABS Take 1 tablet by mouth daily.  . Multiple Vitamins-Minerals (MACULAR VITAMIN BENEFIT) TABS Take 1 tablet by mouth daily. Lutein 25 mg and Zeaxanthin 5 mg  . nebivolol (BYSTOLIC) 10 MG tablet Take 10 mg by mouth 2 (two) times daily.  . meclizine (ANTIVERT) 25 MG tablet Take 1 tablet (25 mg total) by mouth 3 (three) times daily as needed.  Marland Kitchen  promethazine (PHENERGAN) 25 MG tablet Take 1 tablet (25 mg total) by mouth every 6 (six) hours as needed for nausea or vomiting.  . [DISCONTINUED] Nebivolol HCl (BYSTOLIC) 20 MG TABS Take 1 tablet (20 mg total) by mouth 2 (two) times daily. (Patient not taking: Reported on 04/05/2015)   No facility-administered encounter medications on file as of 04/05/2015.     Past Medical History  Diagnosis Date  . Diverticulosis   . HTN (hypertension)   . Allergic rhinitis   . Aortic dissection 2010    Type A  . Carotid bruit     Right. Carotid u/s 2/11 normal.  . Pulmonary nodule     Stable on CT 2/11  . Hyperlipidemia   . S/P CABG x 1   . 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 Prostate cancer   . Skin cancer     scalp 2007  . BCC (basal cell carcinoma of skin) 2014    Past Surgical History  Procedure Laterality Date  . Hemi arch replacement  03/2009 and repeat 10/2011    and AoV resuspension w/ Repl of Ascending Aorta and Hemiarch w/ Graft; Pericardial Tamponade Aortic Regurg 8/22-8/28/10;  endovascular repair of aortic dilation 2013 with  ascending aorta to L common carotid, R subclavian, R common carotid and L carotid subclavian bypass  . Prostatectomy  12/1993    Dr. Risa Grill  . Penile prosthesis implant  1996  . Flex- polyp   06/1999    at 38cm, Int/Ext Hemm divertics [facility] Plan)  . 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 [facility]  08/22-8/28/2010    Thoracic aortic dissection pericardial teamponade aortic regurg  . Colonoscopy    . Polypectomy    . Cardiac surgery  2013    at [facility]- transverse aortic arch/descending aneurysm repair s/p debranching procedure  (total of 5 Gore C-TAG devices extending from distal descending aorta to his prev placed ascending dacron graft)  . Abdominal aortic aneurysm repair  march 2013    Social History  reports that he quit smoking about 20 years ago. His smoking use included Cigarettes. He has a 25 pack-year smoking history. He has never used smokeless tobacco. He reports that he drinks about 0.5 oz of alcohol per week. He reports that he does not use illicit drugs.  Family History family history includes Diabetes in his other and other; Heart disease in his father and mother; Heart failure in his mother; Hyperlipidemia in his daughter; Hypertension in his  mother; Kidney disease in his father; Kidney failure in his father; Stroke in his father. There is no history of Colon cancer, Esophageal cancer, Stomach cancer, or Rectal cancer.        Review of Systems  Constitutional: Negative.   Respiratory: Negative.   Cardiovascular: Negative.   Gastrointestinal: Negative.   Musculoskeletal: Negative.   Skin: Negative.   Neurological:       Spinning, vertigo symptoms  Hematological: Negative.   Psychiatric/Behavioral: Negative.   All other systems reviewed and are negative.  BP 160/70 mmHg  Pulse 68  Ht 6' (1.829 m)  Wt 170 lb (77.111 kg)  BMI 23.05 kg/m2  Physical Exam  Constitutional: He is oriented to person, place, and time. He appears well-developed and well-nourished.  HENT:  Head: Normocephalic.  Nose: Nose normal.  Mouth/Throat: Oropharynx is clear and moist.  Eyes: Conjunctivae are normal. Pupils are equal, round, and reactive to light.  Neck: Normal range of motion. Neck  supple. No JVD present.  Cardiovascular: Normal rate, regular rhythm, S1 normal, S2 normal and intact distal pulses.  Exam reveals no gallop and no friction rub.   Murmur heard.  Crescendo systolic murmur is present with a grade of 2/6  Pulmonary/Chest: Effort normal and breath sounds normal. No respiratory distress. He has no wheezes. He has no rales. He exhibits no tenderness.  Abdominal: Soft. Bowel sounds are normal. He exhibits no distension. There is no tenderness.  Musculoskeletal: Normal range of motion. He exhibits no edema or tenderness.  Well-healed sternotomy scar  Lymphadenopathy:    He has no cervical adenopathy.  Neurological: He is alert and oriented to person, place, and time. Coordination normal.  Skin: Skin is warm and dry. No rash noted. No erythema.  Psychiatric: He has a normal mood and affect. His behavior is normal. Judgment and thought content normal.      Assessment and Plan   Nursing note and vitals reviewed.

## 2015-04-05 NOTE — Patient Instructions (Signed)
You are doing well. No medication changes were made.  Take meclizine and nausea medication as needed  Please monitor your blood pressure  Please call us if you have new issues that need to be addressed before your next appt.  Your physician wants you to follow-up in: 6 months.  You will receive a reminder letter in the mail two months in advance. If you don't receive a letter, please call our office to schedule the follow-up appointment.

## 2015-04-05 NOTE — Assessment & Plan Note (Signed)
Followed at [facility], scheduled for CT scan September 2016

## 2015-04-05 NOTE — Assessment & Plan Note (Signed)
Currently with no symptoms of angina. No further workup at this time. Continue current medication regimen. 

## 2015-04-07 ENCOUNTER — Telehealth: Payer: Self-pay | Admitting: Cardiovascular Disease

## 2015-04-07 ENCOUNTER — Other Ambulatory Visit: Payer: Self-pay | Admitting: *Deleted

## 2015-04-07 MED ORDER — AMLODIPINE BESYLATE 2.5 MG PO TABS: 2.5000 mg | ORAL_TABLET | Freq: Two times a day (BID) | ORAL | Status: DC

## 2015-04-07 NOTE — Telephone Encounter (Signed)
Amlodipine # 30 R#3 sent to [facility] [place].

## 2015-04-07 NOTE — Telephone Encounter (Signed)
°  1. Which medications need to be refilled?   Amlodipine 2.5 mg po 2x daily   2. Which pharmacy is medication to be sent to? [facility] [address] [place]  3. Do they need a 30 day or 90 day supply? 30  4. Would they like a call back once the medication has been sent to the pharmacy? Call if issues .    Will be out after tomorrow

## 2015-04-20 ENCOUNTER — Ambulatory Visit (INDEPENDENT_AMBULATORY_CARE_PROVIDER_SITE_OTHER): Payer: Medicare Other

## 2015-04-20 DIAGNOSIS — Z23 Encounter for immunization: Secondary | ICD-10-CM | POA: Diagnosis not present

## 2015-05-08 ENCOUNTER — Other Ambulatory Visit: Payer: Self-pay | Admitting: Cardiovascular Disease

## 2015-05-16 ENCOUNTER — Telehealth: Payer: Self-pay | Admitting: Family Medicine

## 2015-05-16 ENCOUNTER — Encounter: Payer: Self-pay | Admitting: Family Medicine

## 2015-05-16 ENCOUNTER — Ambulatory Visit (INDEPENDENT_AMBULATORY_CARE_PROVIDER_SITE_OTHER): Payer: Medicare Other | Admitting: Family Medicine

## 2015-05-16 VITALS — BP 140/70 | HR 70 | Temp 98.5°F | Wt 171.0 lb

## 2015-05-16 DIAGNOSIS — R319 Hematuria, unspecified: Secondary | ICD-10-CM

## 2015-05-16 LAB — POCT URINALYSIS DIPSTICK
Bilirubin, UA: NEGATIVE
GLUCOSE UA: NEGATIVE
KETONES UA: NEGATIVE
Nitrite, UA: NEGATIVE
PH UA: 7
Protein, UA: NEGATIVE
Spec Grav, UA: 1.01
Urobilinogen, UA: 0.2

## 2015-05-16 MED ORDER — SULFAMETHOXAZOLE-TRIMETHOPRIM 800-160 MG PO TABS: 1.0000 | ORAL_TABLET | Freq: Two times a day (BID) | ORAL | Status: DC

## 2015-05-16 NOTE — Patient Instructions (Signed)
Skip the next dose of aspirin.  I think you can still go on the trip.  If you have burning with urination, then start the septra.  Update me when you get back from the trip.  If you still see blood in your urine, then we'll set you up with urology.  If you don't have visible blood, then we'll need to recheck your urine dip.  Take care.  Glad to see you.

## 2015-05-16 NOTE — Telephone Encounter (Signed)
Patient Name: Ray Fowler  DOB: 12-Aug-1941    Initial Comment Caller States has blood in urine. had ct other day with contrast, wonders if that might be it. no pain in urination.    Nurse Assessment  Nurse: Thad Ranger RN, Langley Gauss Date/Time (Eastern Time): 05/16/2015 11:55:47 AM  Confirm and document reason for call. If symptomatic, describe symptoms. ---Caller states has blood in urine but denies all other s/s. Has an appt with Dr Damita Dunnings today at 1515.  Has the patient traveled out of the country within the last 30 days? ---Not Applicable  Does the patient require triage? ---Yes  Related visit to physician within the last 2 weeks? ---No  Does the PT have any chronic conditions? (i.e. diabetes, asthma, etc.) ---Yes  List chronic conditions. ---Kidney Stones 30 yrs ago, CAD, HTN     Guidelines    Guideline Title Affirmed Question Affirmed Notes  Urine - Blood In Blood in urine, but all triage questions negative (Exception: could be normal menstrual bleeding)    Final Disposition User   See Physician within [place], RN, Langley Gauss    Comments  Pt states he has already made an appt with Dr Damita Dunnings today at 1515 and agrees to keep the appt as scheduled.   Referrals  REFERRED TO PCP OFFICE   Disagree/Comply: Comply

## 2015-05-16 NOTE — Telephone Encounter (Signed)
Pt has appt with Dr Damita Dunnings 05/16/15 at 3:15.

## 2015-05-16 NOTE — Telephone Encounter (Signed)
Will see today. Thanks

## 2015-05-16 NOTE — Progress Notes (Signed)
Pre visit review using our clinic review tool, if applicable. No additional management support is needed unless otherwise documented below in the visit note.  Going on a trip to [place] tomorrow.  Yesterday with pinkish urine.  No dysuria.  No FCNAVD.  No abd pain.  Today with bloody urine.  It has cleared and is normal to his inspection prior to arrival.  H/o stones years ago, none known recently.  Not passing clots.  Taking ASA QOD.  No other bleeding.  Recently with CT done at [facility], d/w pt: The visualized liver, gallbladder, intra-, and extrahepatic biliary ducts are unremarkable. Mild hepatic steatosis. There is a stable hypodensity within the spleen which fills on delayed images likely representing a hemangioma. The pancreas and adrenal glands are normal in appearance. Multiple fluid attenuation lesions within the right renal cortex likely representing simple cysts. Hyperdense lesion in the posterior right renal cortex similar to prior exam. No hydronephrosis or hydroureter. The bladder is normal in appearance.   He feels well o/w.   PMH and SH reviewed  ROS: See HPI, otherwise noncontributory.  Meds, vitals, and allergies reviewed.   nad ncat Mmm Neck supple, no LA rrr ctab Abd soft, not ttp Ext w/o edema No CVA pain

## 2015-05-17 ENCOUNTER — Encounter: Payer: Self-pay | Admitting: Family Medicine

## 2015-05-17 DIAGNOSIS — R319 Hematuria, unspecified: Secondary | ICD-10-CM | POA: Insufficient documentation

## 2015-05-17 LAB — URINE CULTURE
COLONY COUNT: NO GROWTH
Organism ID, Bacteria: NO GROWTH

## 2015-05-17 NOTE — Assessment & Plan Note (Signed)
Painless.  Likely not a stone given the lack of stone on recent CT.  D/w pt.  Could be exacerbated by ASA.  Can skip next dose.  ddx d/w pt, including bladder CA vs AVMs vs atypical UTI.  At this point, check ucx, hold rx for septra.  Start if ucx pos or if sx.  If sx continue, then refer to uro when back in town.  If no sx, then recheck u/a and if still with microscopic blood, refer to uro.  He agrees.  Looks okay to travel.  We wouldn't emergently refer him if he were going to be in town and not on the trip.

## 2015-05-31 ENCOUNTER — Ambulatory Visit (INDEPENDENT_AMBULATORY_CARE_PROVIDER_SITE_OTHER): Payer: Medicare Other | Admitting: Family Medicine

## 2015-05-31 ENCOUNTER — Encounter: Payer: Self-pay | Admitting: Family Medicine

## 2015-05-31 VITALS — BP 140/80 | HR 71 | Temp 97.6°F | Wt 171.0 lb

## 2015-05-31 DIAGNOSIS — R319 Hematuria, unspecified: Secondary | ICD-10-CM | POA: Diagnosis not present

## 2015-05-31 LAB — POCT URINALYSIS DIPSTICK
BILIRUBIN UA: NEGATIVE
GLUCOSE UA: NEGATIVE
Ketones, UA: NEGATIVE
Leukocytes, UA: NEGATIVE
Nitrite, UA: NEGATIVE
SPEC GRAV UA: 1.02
Urobilinogen, UA: NEGATIVE
pH, UA: 6.5

## 2015-05-31 NOTE — Progress Notes (Signed)
Pre visit review using our clinic review tool, if applicable. No additional management support is needed unless otherwise documented below in the visit note.  He had visible hematuria prev.  Resolved w/o visible sx in the meantime.  He had f/u trace pos today on the dip.   No FCNAV, no stone passage.  He had rx for septra in case he has sx, but didn't need to start the medicine.  He was leaving the day after the last OV to go to [place].  He had a good trip.  He quit smoking 20 years ago.   H/o prostate cancer years ago.  PAS has been 0 for years.    He did have a cold recently but that is resolving and was completely separate from the urinary sx prev.  Sick contacts noted at home.    Meds, vitals, and allergies reviewed.   ROS: See HPI.  Otherwise, noncontributory.  GEN: nad, alert and oriented HEENT: mucous membranes moist, slightly hoarse voice  NECK: supple w/o LA CV: rrr PULM: ctab, no inc wob ABD: soft, +bs EXT: no edema

## 2015-05-31 NOTE — Patient Instructions (Signed)
We'll contact you with your lab report. Take care.  Glad to see you.  Don't change your meds for now.

## 2015-06-01 LAB — URINALYSIS, MICROSCOPIC ONLY

## 2015-06-01 NOTE — Assessment & Plan Note (Signed)
He didn't have UTI on culture, didn't have return of sx or visible blood in urine.  Dip was trace positive today.  Will check urine micro for confirmation.  If pos, then will refer to uro.  He has no sx now and is feeling well.  He agrees with plan.

## 2015-06-05 NOTE — Progress Notes (Signed)
Call pt.  I talked with Dr. Risa Grill with uro.  I am encouraged that his most recent urinary tests were normal but it is still likely reasonable to have patient see Dr. Risa Grill.  He had nothing new on his last CT at [facility] so there isn't a concern for a large tumor causing the blood in his urine.   The concern is the low chance of a small bladder tumor, that could be too small to see on the CT.   The other option, aside from referral, is checking urine cytology to look for abnormal cells.  I am worried about the possibility of a false negative with that.  All in all, I would have him see Dr. Risa Grill again, not urgently.  If patient agrees, I'll put in the referral.  Let me know.  Thanks.

## 2015-06-06 ENCOUNTER — Ambulatory Visit (INDEPENDENT_AMBULATORY_CARE_PROVIDER_SITE_OTHER): Payer: Medicare Other | Admitting: Family Medicine

## 2015-06-06 ENCOUNTER — Encounter: Payer: Self-pay | Admitting: Family Medicine

## 2015-06-06 VITALS — BP 100/60 | HR 84 | Temp 99.6°F | Wt 170.2 lb

## 2015-06-06 DIAGNOSIS — R05 Cough: Secondary | ICD-10-CM | POA: Diagnosis not present

## 2015-06-06 DIAGNOSIS — R319 Hematuria, unspecified: Secondary | ICD-10-CM | POA: Diagnosis not present

## 2015-06-06 DIAGNOSIS — R059 Cough, unspecified: Secondary | ICD-10-CM

## 2015-06-06 MED ORDER — AZITHROMYCIN 250 MG PO TABS: ORAL_TABLET | ORAL | Status: DC

## 2015-06-06 MED ORDER — FENOFIBRATE 145 MG PO TABS: ORAL_TABLET | ORAL | Status: DC

## 2015-06-06 NOTE — Progress Notes (Signed)
Pre visit review using our clinic review tool, if applicable. No additional management support is needed unless otherwise documented below in the visit note.  I talked to patient about uro referral he agrees.  Ordered.   Cough and chest congestion.  More sputum recently, discolored.  T max ~99.5.  Taking tylenol for elevated temp.  Was getting better then worse again.  Sick contact at home.  Sleep disrupted.  He looks fatigued but speaks in complete sentences.  No wheeze noted.  No vomiting, some diarrhea. Head congestion is resolved, only with chest sx now.    Meds, vitals, and allergies reviewed.   ROS: See HPI.  Otherwise, noncontributory.  GEN: nad, alert and oriented HEENT: mucous membranes moist, tm w/o erythema, nasal exam w/o erythema, scant clear discharge noted,  OP with minimal cobblestoning NECK: supple w/o LA CV: rrr with occ ectopy PULM: ctab, no inc wob, cough noted.  EXT: no edema SKIN: no acute rash

## 2015-06-06 NOTE — Patient Instructions (Addendum)
Rosaria Ferries will call about your referral. Mucinex, fluids, and zithromax.   Take care.  Update me as needed.   Glad to see you.

## 2015-06-07 DIAGNOSIS — R059 Cough, unspecified: Secondary | ICD-10-CM | POA: Insufficient documentation

## 2015-06-07 DIAGNOSIS — R05 Cough: Secondary | ICD-10-CM | POA: Insufficient documentation

## 2015-06-07 NOTE — Assessment & Plan Note (Signed)
Nontoxic, d/w pt.  Would treat given the duration, presumed bronchitis.  Mucinex, fluids, and zithromax. F/u prn.

## 2015-06-07 NOTE — Assessment & Plan Note (Signed)
Refer

## 2015-08-04 ENCOUNTER — Other Ambulatory Visit: Payer: Self-pay | Admitting: Family Medicine

## 2015-08-08 ENCOUNTER — Other Ambulatory Visit: Payer: Self-pay | Admitting: Cardiovascular Disease

## 2015-08-10 ENCOUNTER — Other Ambulatory Visit: Payer: Self-pay | Admitting: Family Medicine

## 2015-08-11 ENCOUNTER — Ambulatory Visit (INDEPENDENT_AMBULATORY_CARE_PROVIDER_SITE_OTHER): Payer: Medicare Other | Admitting: Family Medicine

## 2015-08-11 ENCOUNTER — Encounter: Payer: Self-pay | Admitting: Family Medicine

## 2015-08-11 VITALS — BP 112/70 | HR 70 | Temp 97.6°F | Ht 72.0 in | Wt 169.5 lb

## 2015-08-11 DIAGNOSIS — Z8546 Personal history of malignant neoplasm of prostate: Secondary | ICD-10-CM

## 2015-08-11 DIAGNOSIS — Z125 Encounter for screening for malignant neoplasm of prostate: Secondary | ICD-10-CM | POA: Diagnosis not present

## 2015-08-11 DIAGNOSIS — R059 Cough, unspecified: Secondary | ICD-10-CM

## 2015-08-11 DIAGNOSIS — Z0000 Encounter for general adult medical examination without abnormal findings: Secondary | ICD-10-CM

## 2015-08-11 DIAGNOSIS — R319 Hematuria, unspecified: Secondary | ICD-10-CM

## 2015-08-11 DIAGNOSIS — I10 Essential (primary) hypertension: Secondary | ICD-10-CM | POA: Diagnosis not present

## 2015-08-11 DIAGNOSIS — H8110 Benign paroxysmal vertigo, unspecified ear: Secondary | ICD-10-CM

## 2015-08-11 DIAGNOSIS — R05 Cough: Secondary | ICD-10-CM

## 2015-08-11 LAB — COMPREHENSIVE METABOLIC PANEL
ALBUMIN: 4.5 g/dL (ref 3.5–5.2)
ALT: 19 U/L (ref 0–53)
AST: 25 U/L (ref 0–37)
Alkaline Phosphatase: 42 U/L (ref 39–117)
BILIRUBIN TOTAL: 0.8 mg/dL (ref 0.2–1.2)
BUN: 17 mg/dL (ref 6–23)
CALCIUM: 9.5 mg/dL (ref 8.4–10.5)
CO2: 30 meq/L (ref 19–32)
CREATININE: 0.94 mg/dL (ref 0.40–1.50)
Chloride: 105 mEq/L (ref 96–112)
GFR: 83.54 mL/min (ref >60.00)
Glucose, Bld: 94 mg/dL (ref 70–99)
Potassium: 4.1 mEq/L (ref 3.5–5.1)
Sodium: 141 mEq/L (ref 135–145)
TOTAL PROTEIN: 7.7 g/dL (ref 6.0–8.3)

## 2015-08-11 LAB — LIPID PANEL
CHOL/HDL RATIO: 4
Cholesterol: 169 mg/dL (ref 0–200)
HDL: 39.9 mg/dL (ref >39.00)
LDL Cholesterol: 109 mg/dL — ABNORMAL HIGH (ref 0–99)
NonHDL: 128.79
TRIGLYCERIDES: 100 mg/dL (ref 0.0–149.0)
VLDL: 20 mg/dL (ref 0.0–40.0)

## 2015-08-11 LAB — PSA, MEDICARE: PSA: 0 ng/ml — ABNORMAL LOW (ref 0.10–4.00)

## 2015-08-11 NOTE — Assessment & Plan Note (Signed)
Improved with seasonal fluticasone inhaler use.  No ADE on med.  Usually used in winter and occ with high pollen periods.

## 2015-08-11 NOTE — Patient Instructions (Addendum)
Check with your insurance to see if they will cover the tetanus shot. Go to the lab on the way out.  We'll contact you with your lab report. Don't change your meds for now.  Take care.  Glad to see you.

## 2015-08-11 NOTE — Progress Notes (Signed)
Pre visit review using our clinic review tool, if applicable. No additional management support is needed unless otherwise documented below in the visit note.  I have personally reviewed the Medicare Annual Wellness questionnaire and have noted 1. The patient's medical and social history 2. Their use of alcohol, tobacco or illicit drugs 3. Their current medications and supplements 4. The patient's functional ability including ADL's, fall risks, home safety risks and hearing or visual             impairment. 5. Diet and physical activities 6. Evidence for depression or mood disorders  The patients weight, height, BMI have been recorded in the chart and visual acuity is per eye clinic.  I have made referrals, counseling and provided education to the patient based review of the above and I have provided the pt with a written personalized care plan for preventive services.  Provider list updated- see scanned forms.  Routine anticipatory guidance given to patient.  See health maintenance.  Flu 2016 Shingles prev done PNA up to date Tetanus d/w pt Colon 2014 Prostate cancer screening- PSA pending.  Advance directive- wife designated if patient were incapacitated.   Cognitive function addressed- see scanned forms- and if abnormal then additional documentation follows.   Hematuria per urology.  No sx currently.   Hypertension:    Using medication without problems or lightheadedness: still occ lightheaded with rising quickly from squatting/leaning over, but o/w tolerating meds Chest pain with exertion:no Edema:no Short of breath:no Average home BPs: BPs remain variable, usually 130s or occ higher/70s.   Concurrently on statin and tricor, tolerating meds w/o myalgias.  Labs pending.   PMH and SH reviewed  Meds, vitals, and allergies reviewed.   ROS: See HPI.  Otherwise negative.    GEN: nad, alert and oriented HEENT: mucous membranes moist NECK: supple w/o LA CV: rrr. PULM: ctab,  no inc wob ABD: soft, +bs EXT: no edema SKIN: no acute rash

## 2015-08-11 NOTE — Assessment & Plan Note (Signed)
Flu 2016  Shingles prev done  PNA up to date  Tetanus d/w pt  Colon 2014  Prostate cancer screening- PSA pending.  Advance directive- wife designated if patient were incapacitated.  Cognitive function addressed- see scanned forms- and if abnormal then additional documentation follows.

## 2015-08-11 NOTE — Assessment & Plan Note (Signed)
Per uro.

## 2015-08-11 NOTE — Assessment & Plan Note (Signed)
PSA pending. We may be able to stop checking PSA given his distant hx.

## 2015-08-11 NOTE — Assessment & Plan Note (Signed)
Wouldn't change meds at this point.  He'll f/u with cards.  He may need a slight dec in meds given the h/o lightheadedness ie if sx are worse, but I wouldn't change meds at this point given his aortic hx.  D/w pt.  He agrees.

## 2015-08-11 NOTE — Assessment & Plan Note (Signed)
No sx now but if returns we can set him up with ENT.  He'll update me as needed.

## 2015-08-16 ENCOUNTER — Other Ambulatory Visit: Payer: Self-pay | Admitting: Family Medicine

## 2015-08-16 DIAGNOSIS — I10 Essential (primary) hypertension: Secondary | ICD-10-CM

## 2015-08-16 MED ORDER — ATORVASTATIN CALCIUM 10 MG PO TABS
20.0000 mg | ORAL_TABLET | Freq: Every day | ORAL | Status: DC
Start: 2015-08-16 — End: 2015-08-17

## 2015-08-17 ENCOUNTER — Other Ambulatory Visit: Payer: Self-pay | Admitting: Family Medicine

## 2015-08-17 MED ORDER — ATORVASTATIN CALCIUM 10 MG PO TABS: 10.0000 mg | ORAL_TABLET | Freq: Every day | ORAL | Status: DC

## 2015-08-25 ENCOUNTER — Encounter: Payer: Self-pay | Admitting: Cardiovascular Disease

## 2015-09-05 ENCOUNTER — Telehealth: Payer: Self-pay | Admitting: Family Medicine

## 2015-09-05 NOTE — Telephone Encounter (Signed)
Pt came in he is ready to double up on his lipitor and is waiting for the  next step. Do you need to send a new script to the [facility] [place]  cb number is [phone]

## 2015-09-06 ENCOUNTER — Other Ambulatory Visit: Payer: Self-pay | Admitting: Cardiovascular Disease

## 2015-09-22 ENCOUNTER — Ambulatory Visit (INDEPENDENT_AMBULATORY_CARE_PROVIDER_SITE_OTHER): Payer: Medicare Other | Admitting: Family Medicine

## 2015-09-22 ENCOUNTER — Encounter: Payer: Self-pay | Admitting: Family Medicine

## 2015-09-22 VITALS — BP 142/78 | HR 85 | Temp 97.8°F | Wt 171.5 lb

## 2015-09-22 DIAGNOSIS — R059 Cough, unspecified: Secondary | ICD-10-CM

## 2015-09-22 DIAGNOSIS — R05 Cough: Secondary | ICD-10-CM

## 2015-09-22 MED ORDER — ALBUTEROL SULFATE HFA 108 (90 BASE) MCG/ACT IN AERS: 2.0000 | INHALATION_SPRAY | Freq: Four times a day (QID) | RESPIRATORY_TRACT | Status: DC | PRN

## 2015-09-22 MED ORDER — DOXYCYCLINE HYCLATE 100 MG PO TABS: 100.0000 mg | ORAL_TABLET | Freq: Two times a day (BID) | ORAL | Status: DC

## 2015-09-22 NOTE — Patient Instructions (Signed)
Use albuterol as needed, start doxy and drink plenty of fluids.  Take care.  Glad to see you.  Update me as needed.

## 2015-09-22 NOTE — Progress Notes (Signed)
Pre visit review using our clinic review tool, if applicable. No additional management support is needed unless otherwise documented below in the visit note.  Wife has been sick recently.  Mult sick contacts.   Started with ST about 5 days ago.  ST continues.  Now with chest congestion and cough.  Upper airway noise.  Sleep disrupted, better if sitting up.  Some scant sputum.  "It seems like it is in my throat, not my chest."  No fevers.  No vomiting, no diarrhea.  No chills.  No ear pain.  Some rhinorrhea, continues, clear usually.  No head pain now.  The upper chest/neck congestion is the main issue for him.    Meds, vitals, and allergies reviewed.   ROS: See HPI.  Otherwise, noncontributory.  GEN: nad, alert and oriented HEENT: mucous membranes moist, tm w/o erythema, nasal exam w/o erythema, clear discharge noted,  OP with cobblestoning, sinuses not ttp NECK: supple w/o LA, upper airway noise noted with exhalation CV: rrr.   PULM: cough noted, diffuse rhonchi noted in the B upper fields, less in the B lower fields, no wheeze, no focal dec in BS, no inc wob EXT: no edema Recheck pulse ox 93%

## 2015-09-22 NOTE — Assessment & Plan Note (Signed)
Would treat given his exam and hx.  D/w pt.  Okay for outpatient f/u.  Start SABA prn with doxy bid.  See AVS.  He agrees.  Update me as needed.

## 2015-09-30 ENCOUNTER — Ambulatory Visit (INDEPENDENT_AMBULATORY_CARE_PROVIDER_SITE_OTHER)
Admission: RE | Admit: 2015-09-30 | Discharge: 2015-09-30 | Disposition: A | Discharge status: Home / Self Care | Payer: Medicare Other | Source: Ambulatory Visit | Attending: Family Medicine | Admitting: Family Medicine

## 2015-09-30 ENCOUNTER — Encounter: Payer: Self-pay | Admitting: Family Medicine

## 2015-09-30 ENCOUNTER — Ambulatory Visit (INDEPENDENT_AMBULATORY_CARE_PROVIDER_SITE_OTHER): Payer: Medicare Other | Admitting: Family Medicine

## 2015-09-30 VITALS — BP 142/62 | HR 78 | Temp 98.2°F | Wt 173.2 lb

## 2015-09-30 DIAGNOSIS — R059 Cough, unspecified: Secondary | ICD-10-CM

## 2015-09-30 DIAGNOSIS — R05 Cough: Secondary | ICD-10-CM | POA: Diagnosis not present

## 2015-09-30 MED ORDER — AMOXICILLIN-POT CLAVULANATE 875-125 MG PO TABS: 1.0000 | ORAL_TABLET | Freq: Two times a day (BID) | ORAL | Status: DC

## 2015-09-30 NOTE — Patient Instructions (Signed)
Stop doxy, change to augmentin, keep using the inhaler.   Update me next week.  We'll notify you about your chest xray report.  Take care. Glad to see you.

## 2015-09-30 NOTE — Progress Notes (Signed)
Pre visit review using our clinic review tool, if applicable. No additional management support is needed unless otherwise documented below in the visit note.  Still with cough, worse at night and laying down.  Sleeping propped up from post nasal gtt.  Some sputum, yellow.   No fevers.  He doesn't feel sick but still coughing and fatigued from not getting good sleep.   No wheeze.  Still with UAN.  Inhaler doesn't sig change his sx.   Already on doxy, compliant.   No chills, no sweats.    No ear pain.  No vomiting, mild diarrhea on doxy initially but better now.    Meds, vitals, and allergies reviewed.   ROS: See HPI.  Otherwise, noncontributory.  GEN: nad, alert and oriented HEENT: mucous membranes moist, tm w/o erythema, nasal exam w/o erythema, clear discharge noted,  OP with cobblestoning, sinuses not ttp.  NECK: supple w/o LA CV: rrr.   PULM: ctab except for faint scattered B rhonchi but no focal dec in BS, no inc wob EXT: no edema SKIN: no acute rash

## 2015-10-02 ENCOUNTER — Encounter: Payer: Self-pay | Admitting: Family Medicine

## 2015-10-02 NOTE — Assessment & Plan Note (Signed)
I would treat given his other comorbid conditions.   dw pt.  Presumed bronchitis, though could have post nasal gtt contributing to sx.  Stop doxy, change to augmentin, keep using SABA.  He'll update me next week.  See notes on imaging.

## 2015-10-04 ENCOUNTER — Encounter: Payer: Self-pay | Admitting: Cardiovascular Disease

## 2015-10-04 ENCOUNTER — Ambulatory Visit: Payer: Medicare Other | Admitting: Cardiovascular Disease

## 2015-10-04 ENCOUNTER — Ambulatory Visit (INDEPENDENT_AMBULATORY_CARE_PROVIDER_SITE_OTHER): Payer: Medicare Other | Admitting: Cardiovascular Disease

## 2015-10-04 VITALS — BP 146/78 | HR 68 | Ht 72.0 in | Wt 169.5 lb

## 2015-10-04 DIAGNOSIS — I2510 Atherosclerotic heart disease of native coronary artery without angina pectoris: Secondary | ICD-10-CM | POA: Diagnosis not present

## 2015-10-04 DIAGNOSIS — I4892 Unspecified atrial flutter: Secondary | ICD-10-CM | POA: Diagnosis not present

## 2015-10-04 DIAGNOSIS — Z954 Presence of other heart-valve replacement: Secondary | ICD-10-CM | POA: Diagnosis not present

## 2015-10-04 DIAGNOSIS — Z9889 Other specified postprocedural states: Secondary | ICD-10-CM

## 2015-10-04 DIAGNOSIS — I10 Essential (primary) hypertension: Secondary | ICD-10-CM

## 2015-10-04 DIAGNOSIS — I7101 Dissection of thoracic aorta: Secondary | ICD-10-CM

## 2015-10-04 DIAGNOSIS — I71019 Dissection of thoracic aorta, unspecified: Secondary | ICD-10-CM

## 2015-10-04 DIAGNOSIS — Z952 Presence of prosthetic heart valve: Secondary | ICD-10-CM

## 2015-10-04 DIAGNOSIS — E785 Hyperlipidemia, unspecified: Secondary | ICD-10-CM

## 2015-10-04 MED ORDER — ATORVASTATIN CALCIUM 20 MG PO TABS: 20.0000 mg | ORAL_TABLET | Freq: Every day | ORAL | Status: DC

## 2015-10-04 NOTE — Patient Instructions (Addendum)
You are doing well. Please increase the atorvastatin up to 20 mg daily Stop the fenofibrate   We will schedule an echocardiogram for prosthetic aortic valve, ascending aorta graft  Please call us if you have new issues that need to be addressed before your next appt.  Your physician wants you to follow-up in: 6 months.  You will receive a reminder letter in the mail two months in advance. If you don't receive a letter, please call our office to schedule the follow-up appointment.

## 2015-10-04 NOTE — Assessment & Plan Note (Signed)
CT scan showing stable surgical anatomy Was told to follow-up at [facility] with repeat imaging in 2 years time

## 2015-10-04 NOTE — Assessment & Plan Note (Signed)
Currently with no symptoms of angina. No further workup at this time. Continue current medication regimen. 

## 2015-10-04 NOTE — Assessment & Plan Note (Signed)
Seems to have labile blood pressure, likely secondary to previous surgery. No changes made at this time, suggested he monitor his pressures for now

## 2015-10-04 NOTE — Assessment & Plan Note (Signed)
Stable repair, recently visualized by CT scan at [facility]

## 2015-10-04 NOTE — Progress Notes (Signed)
Patient ID: Ray Fowler, male    DOB: [birth year], 75 y.o.   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 Post-op course complicated by persistent HTN and atrial flutter treated with amio (stopped in January 2011).  He has followup with cardiothoracic surgery at [facility] on an annual basis with CT scan surveillance.  CT September 2014 showing remodeling of his thoracic aorta, overall looked good/stable. He presents for routine followup  Of his of his hypertension   in follow-up today, he reports that his blood pressure is up and down, continues to take small dose amlodipine twice a day with bystolic twice a day. Is not particularly worried about his blood pressure.   He reports having rare episodes of vertigo. They be once several months ago  Was told to increase his Lipitor by Dr. Damita Dunnings but was reluctant. Review of CT scan with him from [place] does show subclavian atherosclerosis, as well as other places, mild. CT scan showed stable aneurysm  Denies any significant chest pain, no regular exercise program  Lab work reviewed with him showing total cholesterol 169, LDL 109, triglycerides 100  EKG on today's visit shows normal sinus rhythm with rate 68 bpm, nonspecific ST and T wave abnormality in lead 3 and aVF  Other past medical history Prior ECHO showed EF 45-50%. Had cardiac CT in 12/10 after abnormal myvoiew. Coronaries normal except for 25% RCA. 51mm stable pulmonary nodule.    echo (02/2010) EF 55-60% with grade 1 diastolic dysfx and previous AO dissection flap.  pneumonia in November 2012 with chest x-ray showing dilated aorta. 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  he was referred to [facility] for surgery. 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  Allergies  Allergen Reactions  . Lactose Intolerance (Gi)     Outpatient Encounter Prescriptions as of 10/04/2015  Medication Sig  . albuterol (PROVENTIL HFA;VENTOLIN HFA) 108 (90 Base) MCG/ACT inhaler Inhale 2 puffs into the lungs every 6 (six) hours as needed for wheezing or shortness of breath.  Marland Kitchen amLODipine (NORVASC) 2.5 MG tablet TAKE 1 TABLET (2.5 MG TOTAL) BY MOUTH 2 (TWO) TIMES DAILY.  Marland Kitchen amoxicillin-clavulanate (AUGMENTIN) 875-125 MG tablet Take 1 tablet by mouth 2 (two) times daily.  Marland Kitchen aspirin 81 MG tablet Take one tablet every other day.  Marland Kitchen atorvastatin (LIPITOR) 20 MG tablet Take 1 tablet (20 mg total) by mouth at bedtime.  Marland Kitchen BYSTOLIC 10 MG tablet TAKE ONE TABLET BY MOUTH TWICE DAILY  . FLOVENT HFA 110 MCG/ACT inhaler INHALE ONE PUFF BY MOUTH TWICE DAILY. RINSE MOUTH AFTER USE..  . fluticasone (FLONASE) 50 MCG/ACT nasal spray USE TWO SPRAYS IN EACH NOSTRIL DAILY   . guaiFENesin (MUCINEX) 600 MG 12 hr tablet Take by mouth 2 (two) times daily as needed.  . meclizine (ANTIVERT) 25 MG tablet Take 1 tablet (25 mg total) by mouth 3 (three) times daily  as needed.  . Multiple Vitamin (MULITIVITAMIN WITH MINERALS) TABS Take 1 tablet by mouth daily.  . Multiple Vitamins-Minerals (MACULAR VITAMIN BENEFIT) TABS Take 1 tablet by mouth daily. Lutein 25 mg and Zeaxanthin 5 mg  . promethazine (PHENERGAN) 25 MG tablet Take 1 tablet (25 mg total) by mouth every 6 (six) hours as needed for nausea or vomiting.  . [DISCONTINUED] atorvastatin (LIPITOR) 10 MG tablet Take 1 tablet (10 mg  total) by mouth at bedtime.  . [DISCONTINUED] fenofibrate (TRICOR) 145 MG tablet TAKE ONE TABLET BY MOUTH ONE EVERY OTHER DAY.   No facility-administered encounter medications on file as of 10/04/2015.    Past Medical History  Diagnosis Date  . Diverticulosis   . HTN (hypertension)   . Allergic rhinitis   . Aortic dissection ([place]) 2010    Type A  . Carotid bruit     Right. Carotid u/s 2/11 normal.  . Pulmonary nodule     Stable on CT 2/11  . Hyperlipidemia   . S/P CABG x 1   . 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 Prostate cancer ([place])   . Skin cancer     scalp 2007  . BCC (basal cell carcinoma of skin) 2014  . Renal stones     Past Surgical History  Procedure Laterality Date  . Hemi arch replacement  03/2009 and repeat 10/2011    and AoV resuspension w/ Repl of Ascending Aorta and Hemiarch w/ Graft; Pericardial Tamponade Aortic Regurg 8/22-8/28/10;  endovascular repair of aortic dilation 2013 with  ascending aorta to L common carotid, R subclavian, R common carotid and L carotid subclavian bypass  . Prostatectomy  12/1993    Dr. Risa Grill  . Penile prosthesis implant  1996  . Flex- polyp   06/1999    at 38cm, Int/Ext Hemm divertics [facility])  . 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 [facility]  08/22-8/28/2010    Thoracic aortic dissection pericardial teamponade aortic regurg  . Colonoscopy    . Polypectomy    . Cardiac surgery  2013    at [facility]- transverse aortic arch/descending aneurysm repair s/p debranching procedure  (total of 5 Gore C-TAG devices extending from distal descending aorta to his prev placed ascending dacron graft)  . Abdominal aortic aneurysm repair  march 2013    Social History  reports that he quit smoking about 21 years ago. His smoking use included Cigarettes. He has a 25 pack-year smoking history. He has never used smokeless tobacco. He reports that he drinks about 0.6 oz of alcohol per week. He reports  that he does not use illicit drugs.  Family History family history includes Diabetes in his other and other; Heart disease in his father and mother; Heart failure in his mother; Hyperlipidemia in his daughter; Hypertension in his mother; Kidney disease in his father; Kidney failure in his father; Stroke in his father. There is no history of Colon cancer, Esophageal cancer, Stomach cancer, or Rectal cancer.        Review of Systems  Constitutional: Negative.   Respiratory: Negative.   Cardiovascular: Negative.   Gastrointestinal: Negative.   Musculoskeletal: Negative.   Neurological:       Spinning, vertigo symptoms  Hematological: Negative.   Psychiatric/Behavioral: Negative.   All other systems reviewed and are negative.  BP 146/78 mmHg  Pulse 68  Ht 6' (1.829 m)  Wt 169 lb 8 oz (76.885 kg)  BMI 22.98 kg/m2  Physical Exam  Constitutional: He is oriented to person, place, and time. He appears well-developed and well-nourished.  HENT:  Head: Normocephalic.  Nose: Nose normal.  Mouth/Throat: Oropharynx is clear and moist.  Eyes: Conjunctivae are normal. Pupils are equal, round, and reactive to light.  Neck: Normal range of motion. Neck supple. No JVD present.  Cardiovascular: Normal rate, regular rhythm, S1 normal, S2 normal and intact distal pulses.  Exam reveals no gallop and no friction rub.   Murmur heard.  Crescendo systolic murmur is present with a grade of 2/6  Pulmonary/Chest: Effort normal and breath sounds normal. No respiratory distress. He has no wheezes. He has no rales. He exhibits no tenderness.  Abdominal: Soft. Bowel sounds are normal. He exhibits no distension. There is no tenderness.  Musculoskeletal: Normal range of motion. He exhibits no edema or tenderness.  Well-healed sternotomy scar  Lymphadenopathy:    He has no cervical adenopathy.  Neurological: He is alert and oriented to person, place, and time. Coordination normal.  Skin: Skin is warm and  dry. No rash noted. No erythema.  Psychiatric: He has a normal mood and affect. His behavior is normal. Judgment and thought content normal.      Assessment and Plan   Nursing note and vitals reviewed.

## 2015-10-04 NOTE — Assessment & Plan Note (Addendum)
Initially was reluctant to increase his Lipitor We suggested he stop his fenofibrate and start Lipitor 20 mg daily   Total encounter time more than 25 minutes  Greater than 50% was spent in counseling and coordination of care with the patient

## 2015-10-06 ENCOUNTER — Encounter: Payer: Self-pay | Admitting: Family Medicine

## 2015-10-18 ENCOUNTER — Ambulatory Visit (INDEPENDENT_AMBULATORY_CARE_PROVIDER_SITE_OTHER): Payer: Medicare Other

## 2015-10-18 ENCOUNTER — Other Ambulatory Visit: Payer: Self-pay

## 2015-10-18 DIAGNOSIS — I4892 Unspecified atrial flutter: Secondary | ICD-10-CM | POA: Diagnosis not present

## 2015-10-18 DIAGNOSIS — Z952 Presence of prosthetic heart valve: Secondary | ICD-10-CM

## 2015-10-18 DIAGNOSIS — Z954 Presence of other heart-valve replacement: Secondary | ICD-10-CM | POA: Diagnosis not present

## 2015-10-27 ENCOUNTER — Ambulatory Visit (INDEPENDENT_AMBULATORY_CARE_PROVIDER_SITE_OTHER): Payer: Medicare Other | Admitting: Family Medicine

## 2015-10-27 ENCOUNTER — Encounter: Payer: Self-pay | Admitting: Family Medicine

## 2015-10-27 ENCOUNTER — Ambulatory Visit (INDEPENDENT_AMBULATORY_CARE_PROVIDER_SITE_OTHER)
Admission: RE | Admit: 2015-10-27 | Discharge: 2015-10-27 | Disposition: A | Discharge status: Home / Self Care | Payer: Medicare Other | Source: Ambulatory Visit | Attending: Family Medicine | Admitting: Family Medicine

## 2015-10-27 VITALS — BP 108/78 | HR 72 | Temp 97.8°F | Wt 169.5 lb

## 2015-10-27 DIAGNOSIS — R05 Cough: Secondary | ICD-10-CM

## 2015-10-27 DIAGNOSIS — R059 Cough, unspecified: Secondary | ICD-10-CM

## 2015-10-27 MED ORDER — FLUTICASONE PROPIONATE 50 MCG/ACT NA SUSP: 2.0000 | Freq: Every day | NASAL | Status: DC

## 2015-10-27 MED ORDER — FLUTICASONE PROPIONATE HFA 110 MCG/ACT IN AERO: INHALATION_SPRAY | RESPIRATORY_TRACT | Status: DC

## 2015-10-27 MED ORDER — ALBUTEROL SULFATE HFA 108 (90 BASE) MCG/ACT IN AERS: 2.0000 | INHALATION_SPRAY | Freq: Four times a day (QID) | RESPIRATORY_TRACT | Status: DC | PRN

## 2015-10-27 NOTE — Patient Instructions (Signed)
Cough likely from multiple issues (irritation, post nasal drip, cold air).  Use flonase and flovent every day.  Use albuterol if needed.  Go to the lab on the way out.  We'll contact you with your xray report. Take care.  Glad to see you.  If not some better with this soon, then we can set you up with pulmonary.

## 2015-10-27 NOTE — Progress Notes (Signed)
Pre visit review using our clinic review tool, if applicable. No additional management support is needed unless otherwise documented below in the visit note.  Cough.  Not consistent.  More in the AM, less midday, more later in the afternoon.  Some wheeze at night.  In the last few nights he has been waking at night to urinate.  He has trouble getting back to sleep after that.  No FCNAVD.  He doesn't feel unwell but the cough continues.  Cold air makes his wheeze start.  Some sputum in the AM, clear.    Meds, vitals, and allergies reviewed.   ROS: See HPI.  Otherwise, noncontributory.  GEN: nad, alert and oriented HEENT: mucous membranes moist, tm w/o erythema, nasal exam w/o erythema, clear discharge noted,  OP with cobblestoning NECK: supple w/o LA CV: rrr.   PULM: scant exp wheeze but o/w ctab, no inc wob EXT: no edema

## 2015-10-31 NOTE — Assessment & Plan Note (Signed)
Cough likely from multiple issues (irritation, post nasal drip, cold air).  Use flonase and flovent every day.  Use albuterol if needed.  See notes on xray.  If not some better with this soon, then we can refer to pulmonary.

## 2015-11-17 ENCOUNTER — Other Ambulatory Visit: Payer: Self-pay | Admitting: Family Medicine

## 2015-11-19 ENCOUNTER — Other Ambulatory Visit: Payer: Self-pay | Admitting: Cardiovascular Disease

## 2015-11-29 ENCOUNTER — Other Ambulatory Visit: Payer: Self-pay | Admitting: Cardiovascular Disease

## 2015-12-13 ENCOUNTER — Encounter: Payer: Self-pay | Admitting: Family Medicine

## 2015-12-20 ENCOUNTER — Other Ambulatory Visit (INDEPENDENT_AMBULATORY_CARE_PROVIDER_SITE_OTHER): Payer: Medicare Other

## 2015-12-20 ENCOUNTER — Encounter: Payer: Self-pay | Admitting: *Deleted

## 2015-12-20 DIAGNOSIS — I10 Essential (primary) hypertension: Secondary | ICD-10-CM | POA: Diagnosis not present

## 2015-12-20 LAB — LIPID PANEL
CHOL/HDL RATIO: 3
Cholesterol: 136 mg/dL (ref 0–200)
HDL: 39.5 mg/dL (ref >39.00)
LDL CALC: 66 mg/dL (ref 0–99)
NonHDL: 96.71
TRIGLYCERIDES: 155 mg/dL — AB (ref 0.0–149.0)
VLDL: 31 mg/dL (ref 0.0–40.0)

## 2015-12-23 ENCOUNTER — Encounter: Payer: Self-pay | Admitting: Family Medicine

## 2016-02-17 ENCOUNTER — Telehealth: Payer: Self-pay

## 2016-02-17 NOTE — Telephone Encounter (Signed)
Patient is on the list for [employer] 2017 and may be a good candidate for an AWV in 2017. Please let me know if/when appt is scheduled.   

## 2016-03-14 ENCOUNTER — Other Ambulatory Visit: Payer: Self-pay | Admitting: Cardiovascular Disease

## 2016-04-03 ENCOUNTER — Ambulatory Visit (INDEPENDENT_AMBULATORY_CARE_PROVIDER_SITE_OTHER): Payer: Medicare Other | Admitting: Cardiovascular Disease

## 2016-04-03 ENCOUNTER — Encounter: Payer: Self-pay | Admitting: Cardiovascular Disease

## 2016-04-03 VITALS — BP 140/80 | HR 60 | Ht 72.0 in | Wt 171.8 lb

## 2016-04-03 DIAGNOSIS — I7101 Dissection of thoracic aorta: Secondary | ICD-10-CM

## 2016-04-03 DIAGNOSIS — E785 Hyperlipidemia, unspecified: Secondary | ICD-10-CM

## 2016-04-03 DIAGNOSIS — I359 Nonrheumatic aortic valve disorder, unspecified: Secondary | ICD-10-CM

## 2016-04-03 DIAGNOSIS — I4892 Unspecified atrial flutter: Secondary | ICD-10-CM | POA: Diagnosis not present

## 2016-04-03 DIAGNOSIS — I2510 Atherosclerotic heart disease of native coronary artery without angina pectoris: Secondary | ICD-10-CM

## 2016-04-03 DIAGNOSIS — I10 Essential (primary) hypertension: Secondary | ICD-10-CM

## 2016-04-03 DIAGNOSIS — I71019 Dissection of thoracic aorta, unspecified: Secondary | ICD-10-CM

## 2016-04-03 DIAGNOSIS — Z9889 Other specified postprocedural states: Secondary | ICD-10-CM

## 2016-04-03 MED ORDER — ATORVASTATIN CALCIUM 20 MG PO TABS: 20.0000 mg | ORAL_TABLET | Freq: Every day | ORAL | 3 refills | Status: DC

## 2016-04-03 MED ORDER — NEBIVOLOL HCL 10 MG PO TABS: 10.0000 mg | ORAL_TABLET | Freq: Two times a day (BID) | ORAL | 3 refills | Status: DC

## 2016-04-03 MED ORDER — AMLODIPINE BESYLATE 2.5 MG PO TABS: 2.5000 mg | ORAL_TABLET | Freq: Two times a day (BID) | ORAL | 3 refills | Status: DC

## 2016-04-03 NOTE — Progress Notes (Signed)
Cardiology Office Note  Date:  04/03/2016   ID:  Fowler, Ray 01-22-1941, MRN [id number]  PCP:  Elsie Stain, MD   Chief Complaint  Patient presents with  . Other    6 month follow up. Meds reviewed by the patient verbally. "doing well."     HPI:  Ray Fowler is a 75 y/o male with h/o HTN,  hyperlipidemia, Type A aortic dissection in [id number] complicated by cardiac tamponade, CT showed extension of the dissection from the aortic root all the way down to the femoral arteries, s/p aortic root replacement of hemiarch repair and aortic valve resuspension. Post-op course complicated by persistent HTN and atrial flutter treated with amio (stopped in January 2011).  He has followup with cardiothoracic surgery at [facility] on an annual basis with CT scan surveillance.  CT September 2014 showing remodeling of his thoracic aorta, overall looked good/stable. He presents for routine followup  Of his of his hypertension And aortic dissection   in follow-up today, he reports that his blood pressure is up and down,  continues to take small dose amlodipine twice a day with bystolic 10 mg twice a day.  Is not particularly worried about his blood pressure.  Reports blood pressure at home typically 130 up to 140 When he goes to Dr. offices, will typically run higher No regular exercise program, otherwise feels well  Previous symptoms of dizziness, vertigo have improved Concerned that changing doses of his blood pressure medication will make his symptoms return  Tolerating Lipitor 20 mg daily Previous CT scan from [facility] hospital September 2016, stable disease  subclavian atherosclerosis, as well as other places, mild.  stable aneurysm  EKG on today's visit shows normal sinus rhythm with rate 62 bpm, nonspecific ST abnormality  Other past medical history Prior ECHO showed EF 45-50%. Had cardiac CT in 12/10 after abnormal myvoiew. Coronaries normal except for 25% RCA. 74mm stable  pulmonary nodule.    echo (02/2010) EF 55-60% with grade 1 diastolic dysfx and previous AO dissection flap.  pneumonia in November 2012 with chest x-ray showing dilated aorta. 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  he was referred to [facility] for surgery. 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  PSH:    Past Surgical History:  Procedure Laterality Date  . ABDOMINAL AORTIC ANEURYSM REPAIR  march 2013  . Axillary Mass removal  05/2000   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Ray Fowler)  . CARDIAC SURGERY  2013   at [facility]- transverse aortic arch/descending aneurysm repair s/p debranching procedure  (total of 5 Gore C-TAG devices extending from distal descending aorta to his prev placed ascending dacron graft)  . COLONOSCOPY    . Flex- Polyp   06/1999   at 38cm, Int/Ext Hemm divertics Fuller Plan)  . Hemi Arch Replacement  03/2009 and repeat 10/2011   and AoV resuspension w/ Repl of Ascending Aorta and Hemiarch  w/ Graft; Pericardial Tamponade Aortic Regurg 8/22-8/28/10;  endovascular repair of aortic dilation 2013 with  ascending aorta to L common carotid, R subclavian, R common carotid and L carotid subclavian bypass  . [facility]  08/22-8/28/2010   Thoracic aortic dissection pericardial teamponade aortic regurg  . PENILE  PROSTHESIS IMPLANT  1996  . POLYPECTOMY    . PROSTATECTOMY  12/1993   Dr. Risa Grill    Current Outpatient Prescriptions  Medication Sig Dispense Refill  . albuterol (PROVENTIL HFA;VENTOLIN HFA) 108 (90 Base) MCG/ACT inhaler Inhale 2 puffs into the lungs every 6 (six) hours as needed for wheezing or shortness of breath. 1 Inhaler 3  . amLODipine (NORVASC) 2.5 MG tablet Take 1 tablet (2.5 mg total) by mouth 2 (two) times daily. 180 tablet 3  . aspirin 81 MG tablet Take one tablet every other day.    Marland Kitchen atorvastatin (LIPITOR) 20 MG tablet Take 1 tablet (20 mg total) by mouth at bedtime. 90 tablet 3  . fluticasone (FLONASE) 50 MCG/ACT nasal spray USE TWO SPRAYS IN EACH NOSTRIL DAILY 16 g 11  . fluticasone (FLOVENT HFA) 110 MCG/ACT inhaler INHALE ONE PUFF BY MOUTH TWICE DAILY. RINSE MOUTH AFTER USE.. 12 g 3  . guaiFENesin (MUCINEX) 600 MG 12 hr tablet Take by mouth 2 (two) times daily as needed.    . meclizine (ANTIVERT) 25 MG tablet Take 1 tablet (25 mg total) by mouth 3 (three) times daily as needed. 90 tablet 3  . Multiple Vitamin (MULITIVITAMIN WITH MINERALS) TABS Take 1 tablet by mouth daily.    . Multiple Vitamins-Minerals (MACULAR VITAMIN BENEFIT) TABS Take 1 tablet by mouth daily. Lutein 25 mg and Zeaxanthin 5 mg    . nebivolol (BYSTOLIC) 10 MG tablet Take 1 tablet (10 mg total) by mouth 2 (two) times daily. 180 tablet 3  . promethazine (PHENERGAN) 25 MG tablet Take 1 tablet (25 mg total) by mouth every 6 (six) hours as needed for nausea or vomiting. 30 tablet 3   No current facility-administered medications for this visit.      Allergies:   Lactose intolerance (gi)   Social History:  The patient  reports that he quit smoking about 21 years ago. His smoking use included Cigarettes. He has a 25.00 pack-year smoking history. He has never used smokeless tobacco. He reports that he drinks about 0.6 oz of alcohol per week . He reports that he does not use drugs.   Family History:   family  history includes Diabetes in his other and other; Heart disease in his father and mother; Heart failure in his mother; Hyperlipidemia in his daughter; Hypertension in his mother; Kidney disease in his father; Kidney failure in his father; Stroke in his father.    Review of Systems: Review of Systems  Constitutional: Negative.   Respiratory: Negative.   Cardiovascular: Negative.   Gastrointestinal: Negative.   Musculoskeletal: Positive for back pain.  Neurological: Negative.   Psychiatric/Behavioral: Negative.   All other systems reviewed and are negative.    PHYSICAL EXAM: VS:  BP 140/80 (BP Location: Left Arm, Patient Position: Sitting, Cuff Size: Normal)   Pulse 60   Ht 6' (1.829 m)   Wt 171 lb 12 oz (77.9 kg)   BMI 23.29 kg/m  , BMI Body mass index is 23.29 kg/m. GEN: Well nourished, well developed, in no acute distress  HEENT: normal  Neck: no JVD, + carotid bruits left side, no masses Cardiac: RRR; no murmurs, rubs, or gallops,no edema  Respiratory:  clear to auscultation bilaterally, normal work of breathing GI: soft, nontender, nondistended, + BS MS: no deformity or atrophy  Skin: warm and dry, no rash Neuro:  Strength and sensation are intact Psych: euthymic mood, full affect    Recent Labs: 08/11/2015: ALT 19; BUN 17; Creatinine, Ser 0.94; Potassium 4.1; Sodium 141    Lipid Panel Lab Results  Component Value Date   CHOL 136 12/20/2015   HDL 39.50 12/20/2015   LDLCALC 66 12/20/2015   TRIG 155.0 (H) 12/20/2015      Wt Readings from Last 3 Encounters:  04/03/16 171 lb 12 oz (77.9 kg)  10/27/15 169 lb 8 oz (76.9 kg)  10/04/15 169 lb 8 oz (76.9 kg)       ASSESSMENT AND PLAN:  Aortic valve disorder - Plan: EKG 12-Lead Well-functioning aortic valve by echocardiogram March 2017  Atrial flutter, unspecified type ([place]) - Plan: EKG 12-Lead Maintaining normal sinus rhythm No changes to his medications, will stay on beta blocker  Coronary artery disease  involving native coronary artery of native heart without angina pectoris - Plan: EKG 12-Lead Currently with no symptoms of angina. No further workup at this time. Continue current medication regimen.  Essential hypertension Long discussion concerning management of his blood pressure. Recommended he monitor blood pressure at home, write numbers down on a piece of paper. By his account, systolic pressure typically 130 up to 140. He is hesitant to advance his medications, concerned that he might get dizzy again.  Dissecting aortic aneurysm (any part), thoracic (HCC) Seen on CT scan, managed by [facility]  Hyperlipidemia Cholesterol is at goal on the current lipid regimen. No changes to the medications were made.  H/O ascending aorta repair CT scan September 2018 per the patient Long discussion concerning post operative musculoskeletal issues   Total encounter time more than 25 minutes  Greater than 50% was spent in counseling and coordination of care with the patient   Disposition:   F/U  6 months   Orders Placed This Encounter  Procedures  . EKG 12-Lead     Signed, Esmond Plants, M.D., Ph.D. 04/03/2016  [place], [place]

## 2016-04-03 NOTE — Patient Instructions (Signed)
Medication Instructions:   No medication changes  Labwork:  No new labs  Testing/Procedures:  No new testing  Follow-Up: It was a pleasure seeing you in the office today. Please call us if you have new issues that need to be addressed before your next appt.  [phone]  Your physician wants you to follow-up in: 6 months.  You will receive a reminder letter in the mail two months in advance. If you don't receive a letter, please call our office to schedule the follow-up appointment.  If you need a refill on your cardiac medications before your next appointment, please call your pharmacy.     

## 2016-05-02 ENCOUNTER — Ambulatory Visit (INDEPENDENT_AMBULATORY_CARE_PROVIDER_SITE_OTHER): Payer: Medicare Other

## 2016-05-02 DIAGNOSIS — Z23 Encounter for immunization: Secondary | ICD-10-CM | POA: Diagnosis not present

## 2016-08-13 ENCOUNTER — Other Ambulatory Visit: Payer: Self-pay | Admitting: Family Medicine

## 2016-08-13 DIAGNOSIS — Z125 Encounter for screening for malignant neoplasm of prostate: Secondary | ICD-10-CM

## 2016-08-13 DIAGNOSIS — I10 Essential (primary) hypertension: Secondary | ICD-10-CM

## 2016-08-15 ENCOUNTER — Ambulatory Visit (INDEPENDENT_AMBULATORY_CARE_PROVIDER_SITE_OTHER): Payer: Medicare Other

## 2016-08-15 VITALS — BP 154/70 | HR 73 | Temp 98.8°F | Ht 70.5 in | Wt 169.8 lb

## 2016-08-15 DIAGNOSIS — Z125 Encounter for screening for malignant neoplasm of prostate: Secondary | ICD-10-CM | POA: Diagnosis not present

## 2016-08-15 DIAGNOSIS — I10 Essential (primary) hypertension: Secondary | ICD-10-CM

## 2016-08-15 DIAGNOSIS — Z0000 Encounter for general adult medical examination without abnormal findings: Secondary | ICD-10-CM | POA: Diagnosis not present

## 2016-08-15 LAB — COMPREHENSIVE METABOLIC PANEL
ALBUMIN: 4.5 g/dL (ref 3.5–5.2)
ALT: 23 U/L (ref 0–53)
AST: 25 U/L (ref 0–37)
Alkaline Phosphatase: 59 U/L (ref 39–117)
BUN: 17 mg/dL (ref 6–23)
CHLORIDE: 104 meq/L (ref 96–112)
CO2: 32 mEq/L (ref 19–32)
Calcium: 9.3 mg/dL (ref 8.4–10.5)
Creatinine, Ser: 0.9 mg/dL (ref 0.40–1.50)
GFR: 87.6 mL/min (ref >60.00)
Glucose, Bld: 101 mg/dL — ABNORMAL HIGH (ref 70–99)
POTASSIUM: 3.9 meq/L (ref 3.5–5.1)
SODIUM: 142 meq/L (ref 135–145)
Total Bilirubin: 1 mg/dL (ref 0.2–1.2)
Total Protein: 7.3 g/dL (ref 6.0–8.3)

## 2016-08-15 LAB — LIPID PANEL
CHOLESTEROL: 129 mg/dL (ref 0–200)
HDL: 36 mg/dL — AB (ref >39.00)
LDL CALC: 68 mg/dL (ref 0–99)
NonHDL: 92.88
Total CHOL/HDL Ratio: 4
Triglycerides: 126 mg/dL (ref 0.0–149.0)
VLDL: 25.2 mg/dL (ref 0.0–40.0)

## 2016-08-15 LAB — PSA, MEDICARE: PSA: 0 ng/mL — AB (ref 0.10–4.00)

## 2016-08-15 NOTE — Progress Notes (Signed)
Subjective:   Ray Fowler is a 76 y.o. male who presents for Medicare Annual/Subsequent preventive examination.  Review of Systems:  N/A Cardiac Risk Factors include: advanced age (>49men, >54 women);male gender;dyslipidemia;hypertension     Objective:    Vitals: BP (!) 154/70 (BP Location: Left Arm, Patient Position: Sitting, Cuff Size: Normal) Comment: no AM BP medications  Pulse 73   Temp 98.8 F (37.1 C) (Oral)   Ht 5' 10.5" (1.791 m) Comment: no shoes  Wt 169 lb 12 oz (77 kg)   SpO2 93%   BMI 24.01 kg/m   Body mass index is 24.01 kg/m.  Tobacco History  Smoking Status  . Former Smoker  . Packs/day: 1.00  . Years: 25.00  . Types: Cigarettes  . Quit date: 07/10/1994  Smokeless Tobacco  . Never Used    Comment: quit 1995     Counseling given: No   Past Medical History:  Diagnosis Date  . Allergic rhinitis   . Aortic arch aneurysm ([place]) 10/2011  . Aortic dissection ([place]) 2010   Type A  . BCC (basal cell carcinoma of skin) 2014  . Carotid bruit    Right. Carotid u/s 2/11 normal.  . Diverticulosis   . HTN (hypertension)   . Hyperlipidemia   . Prostate cancer ([place])   . Pulmonary nodule    Stable on CT 2/11  . Renal stones   . S/P CABG x 1   . Skin cancer    scalp 2007   Past Surgical History:  Procedure Laterality Date  . ABDOMINAL AORTIC ANEURYSM REPAIR  march 2013  . 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 CARDIAC SURGERY  2013   at [facility]- transverse aortic arch/descending aneurysm repair s/p debranching procedure  (total of 5 Gore C-TAG devices extending from distal descending aorta to his prev placed ascending dacron graft)  . COLONOSCOPY    . Flex- Polyp   06/1999   at 38cm, Int/Ext Hemm divertics Fuller Plan)  . Hemi Arch Replacement  03/2009 and repeat 10/2011   and AoV resuspension w/ Repl of Ascending Aorta and Hemiarch w/ Graft; Pericardial Tamponade Aortic Regurg 8/22-8/28/10;  endovascular  repair of aortic dilation 2013 with  ascending aorta to L common carotid, R subclavian, R common carotid and L carotid subclavian bypass  . [facility]  08/22-8/28/2010   Thoracic aortic dissection pericardial teamponade aortic regurg  . PENILE PROSTHESIS IMPLANT  1996  . POLYPECTOMY    . PROSTATECTOMY  12/1993   Dr. Risa Grill   Family History  Problem Relation Age of Onset  . Heart failure Mother     died age 25  . Hypertension Mother   . Heart disease Mother     CHF  . Kidney failure Father   . Heart disease Father     coronary disease, CABG  . Stroke Father     due to "infection" from [place]  . Kidney disease Father     chronic  . Diabetes Other   . Diabetes Other   . Hyperlipidemia Daughter   . Colon cancer Neg Hx   . Esophageal cancer Neg Hx   . Stomach cancer Neg Hx   . Rectal cancer Neg Hx    History  Sexual Activity  . Sexual activity: Yes    Outpatient Encounter Prescriptions as of 08/15/2016  Medication Sig  . albuterol (PROVENTIL HFA;VENTOLIN HFA) 108 (90 Base) MCG/ACT inhaler Inhale 2 puffs into the lungs every  6 (six) hours as needed for wheezing or shortness of breath.  Marland Kitchen amLODipine (NORVASC) 2.5 MG tablet Take 1 tablet (2.5 mg total) by mouth 2 (two) times daily.  Marland Kitchen aspirin 81 MG tablet Take one tablet every other day.  Marland Kitchen atorvastatin (LIPITOR) 20 MG tablet Take 1 tablet (20 mg total) by mouth at bedtime.  . fluticasone (FLONASE) 50 MCG/ACT nasal spray USE TWO SPRAYS IN EACH NOSTRIL DAILY  . fluticasone (FLOVENT HFA) 110 MCG/ACT inhaler INHALE ONE PUFF BY MOUTH TWICE DAILY. RINSE MOUTH AFTER USE..  . guaiFENesin (MUCINEX) 600 MG 12 hr tablet Take by mouth 2 (two) times daily as needed.  . meclizine (ANTIVERT) 25 MG tablet Take 1 tablet (25 mg total) by mouth 3 (three) times daily as needed.  . Multiple Vitamin (MULITIVITAMIN WITH MINERALS) TABS Take 1 tablet by mouth daily.  . Multiple Vitamins-Minerals (MACULAR VITAMIN BENEFIT) TABS Take 1 tablet by mouth  daily. Lutein 25 mg and Zeaxanthin 5 mg  . nebivolol (BYSTOLIC) 10 MG tablet Take 1 tablet (10 mg total) by mouth 2 (two) times daily.  . promethazine (PHENERGAN) 25 MG tablet Take 1 tablet (25 mg total) by mouth every 6 (six) hours as needed for nausea or vomiting.   No facility-administered encounter medications on file as of 08/15/2016.     Activities of Daily Living In your present state of health, do you have any difficulty performing the following activities: 08/15/2016  Hearing? N  Vision? N  Difficulty concentrating or making decisions? N  Walking or climbing stairs? N  Dressing or bathing? N  Doing errands, shopping? N  Preparing Food and eating ? N  Using the Toilet? N  In the past six months, have you accidently leaked urine? Y  Do you have problems with loss of bowel control? N  Managing your Medications? N  Managing your Finances? N  Housekeeping or managing your Housekeeping? N  Some recent data might be hidden    Patient Care Team: Tonia Ghent, MD as PCP - General Minna Merritts, MD (Cardiology) Sharyne Peach, MD as Consulting Physician (Ophthalmology) Rana Snare, MD as Consulting Physician (Urology) Druscilla Brownie, MD as Consulting Physician (Dermatology) Anson Crofts, MD as Attending Physician (Cardiothoracic Surgery)   Assessment:     Hearing Screening   125Hz  250Hz  500Hz  1000Hz  2000Hz  3000Hz  4000Hz  6000Hz  8000Hz   Right ear:   40 0 40  0    Left ear:   40 40 40  40    Vision Screening Comments: Last vision exam was in December 2016; future appt scheduled Jan 2018   Exercise Activities and Dietary recommendations Current Exercise Habits: Home exercise routine, Type of exercise: Other - see comments (stationary bike), Time (Minutes): 35, Frequency (Times/Week): 3, Weekly Exercise (Minutes/Week): 105, Intensity: Moderate, Exercise limited by: None identified  Goals    . Increase physical activity          Starting 08/15/16, I will continue to  exercise at least 30-40 min 3 days per week.       Fall Risk Fall Risk  08/15/2016 08/11/2015 07/30/2013 07/18/2012  Falls in the past year? No No No No   Depression Screen PHQ 2/9 Scores 08/15/2016 08/11/2015 07/30/2013 07/18/2012  PHQ - 2 Score 0 0 0 0    Cognitive Function MMSE - Mini Mental State Exam 08/15/2016  Orientation to time 5  Orientation to Place 5  Registration 3  Attention/ Calculation 0  Recall 3  Language- name 2 objects  0  Language- repeat 1  Language- follow 3 step command 3  Language- read & follow direction 0  Write a sentence 0  Copy design 0  Total score 20  PLEASE NOTE: A Mini-Cog screen was completed. Maximum score is 20. A value of 0 denotes this part of [place] MMSE was not completed or the patient failed this part of the Mini-Cog screening.   Mini-Cog Screening Orientation to Time - Max 5 pts Orientation to Place - Max 5 pts Registration - Max 3 pts Recall - Max 3 pts Language Repeat - Max 1 pts Language Follow 3 Step Command - Max 3 pts       Immunization History  Administered Date(s) Administered  . Influenza Split 07/11/2011, 05/08/2012  . Influenza Whole 06/11/2007, 06/15/2008, 05/30/2009, 06/29/2010  . Influenza,inj,Quad PF,36+ Mos 06/25/2013, 04/15/2014, 04/20/2015, 05/02/2016  . Pneumococcal Conjugate-13 08/09/2014  . Pneumococcal Polysaccharide-23 06/28/2009  . Td 07/28/2004  . Zoster 06/19/2007   Screening Tests Health Maintenance  Topic Date Due  . TETANUS/TDAP  07/27/2024 (Originally 07/28/2014)  . COLONOSCOPY  08/14/2017  . INFLUENZA VACCINE  Completed  . ZOSTAVAX  Completed  . PNA vac Low Risk Adult  Completed      Plan:     I have personally reviewed and addressed the Medicare Annual Wellness questionnaire and have noted the following in the patient's chart:  A. Medical and social history B. Use of alcohol, tobacco or illicit drugs  C. Current medications and supplements D. Functional ability and status E.    Nutritional status F.  Physical activity G. Advance directives H. List of other physicians I.  Hospitalizations, surgeries, and ER visits in previous 12 months J.  [place] to include hearing, vision, cognitive, depression L. Referrals and appointments - none  In addition, I have reviewed and discussed with patient certain preventive protocols, quality metrics, and best practice recommendations. A written personalized care plan for preventive services as well as general preventive health recommendations were provided to patient.  See attached scanned questionnaire for additional information.   Signed,   Lindell Noe, MHA, BS, LPN Health Coach

## 2016-08-15 NOTE — Patient Instructions (Signed)
Ray Fowler , Thank you for taking time to come for your Medicare Wellness Visit. I appreciate your ongoing commitment to your health goals. Please review the following plan we discussed and let me know if I can assist you in the future.   These are the goals we discussed: Goals    . Increase physical activity          Starting 08/15/16, I will continue to exercise at least 30-40 min 3 days per week.        This is a list of the screening recommended for you and due dates:  Health Maintenance  Topic Date Due  . Tetanus Vaccine  07/27/2024*  . Colon Cancer Screening  08/14/2017  . Flu Shot  Completed  . Shingles Vaccine  Completed  . Pneumonia vaccines  Completed  *Topic was postponed. The date shown is not the original due date.   Preventive Care for Adults  A healthy lifestyle and preventive care can promote health and wellness. Preventive health guidelines for adults include the following key practices.  . A routine yearly physical is a good way to check with your health care provider about your health and preventive screening. It is a chance to share any concerns and updates on your health and to receive a thorough exam.  . Visit your dentist for a routine exam and preventive care every 6 months. Brush your teeth twice a day and floss once a day. Good oral hygiene prevents tooth decay and gum disease.  . The frequency of eye exams is based on your age, health, family medical history, use  of contact lenses, and other factors. Follow your health care provider's ecommendations for frequency of eye exams.  . Eat a healthy diet. Foods like vegetables, fruits, whole grains, low-fat dairy products, and lean protein foods contain the nutrients you need without too many calories. Decrease your intake of foods high in solid fats, added sugars, and salt. Eat the right amount of calories for you. Get information about a proper diet from your health care provider, if necessary.  . Regular  physical exercise is one of the most important things you can do for your health. Most adults should get at least 150 minutes of moderate-intensity exercise (any activity that increases your heart rate and causes you to sweat) each week. In addition, most adults need muscle-strengthening exercises on 2 or more days a week.  Silver Sneakers may be a benefit available to you. To determine eligibility, you may visit the website: www.silversneakers.com or contact program at 986-095-5781 Mon-Fri between 8AM-8PM.   . Maintain a healthy weight. The body mass index (BMI) is a screening tool to identify possible weight problems. It provides an estimate of body fat based on height and weight. Your health care provider can find your BMI and can help you achieve or maintain a healthy weight.   For adults 20 years and older: ? A BMI below 18.5 is considered underweight. ? A BMI of 18.5 to 24.9 is normal. ? A BMI of 25 to 29.9 is considered overweight. ? A BMI of 30 and above is considered obese.   . Maintain normal blood lipids and cholesterol levels by exercising and minimizing your intake of saturated fat. Eat a balanced diet with plenty of fruit and vegetables. Blood tests for lipids and cholesterol should begin at age 56 and be repeated every 5 years. If your lipid or cholesterol levels are high, you are over 50, or you are at high  risk for heart disease, you may need your cholesterol levels checked more frequently. Ongoing high lipid and cholesterol levels should be treated with medicines if diet and exercise are not working.  . If you smoke, find out from your health care provider how to quit. If you do not use tobacco, please do not start.  . If you choose to drink alcohol, please do not consume more than 2 drinks per day. One drink is considered to be 12 ounces (355 mL) of beer, 5 ounces (148 mL) of wine, or 1.5 ounces (44 mL) of liquor.  . If you are 11-35 years old, ask your health care provider if  you should take aspirin to prevent strokes.  . Use sunscreen. Apply sunscreen liberally and repeatedly throughout the day. You should seek shade when your shadow is shorter than you. Protect yourself by wearing long sleeves, pants, a wide-brimmed hat, and sunglasses year round, whenever you are outdoors.  . Once a month, do a whole body skin exam, using a mirror to look at the skin on your back. Tell your health care provider of new moles, moles that have irregular borders, moles that are larger than a pencil eraser, or moles that have changed in shape or color.

## 2016-08-15 NOTE — Progress Notes (Signed)
PCP notes:   Health maintenance:   Tetanus - addressed; pt is ill today; will get a CPE if feeling better  Abnormal screenings:   Hearing - failed  Patient concerns:   None  Nurse concerns:  None  Next PCP appt:   08/21/16 @ 1045

## 2016-08-15 NOTE — Progress Notes (Signed)
Pre visit review using our clinic review tool, if applicable. No additional management support is needed unless otherwise documented below in the visit note. 

## 2016-08-16 ENCOUNTER — Encounter: Payer: Self-pay | Admitting: Internal Medicine

## 2016-08-16 ENCOUNTER — Ambulatory Visit (INDEPENDENT_AMBULATORY_CARE_PROVIDER_SITE_OTHER): Payer: Medicare Other | Admitting: Internal Medicine

## 2016-08-16 VITALS — BP 140/86 | HR 79 | Temp 100.2°F | Wt 171.0 lb

## 2016-08-16 DIAGNOSIS — R6889 Other general symptoms and signs: Secondary | ICD-10-CM | POA: Diagnosis not present

## 2016-08-16 MED ORDER — OSELTAMIVIR PHOSPHATE 75 MG PO CAPS: 75.0000 mg | ORAL_CAPSULE | Freq: Two times a day (BID) | ORAL | 0 refills | Status: DC

## 2016-08-16 NOTE — Progress Notes (Signed)
I reviewed health advisor's note, was available for consultation, and agree with documentation and plan.  

## 2016-08-16 NOTE — Patient Instructions (Signed)
Viral Respiratory Infection °Introduction °A viral respiratory infection is an illness that affects parts of the body used for breathing, like the lungs, nose, and throat. It is caused by a germ called a virus. °Some examples of this kind of infection are: °· A cold. °· The flu (influenza). °· A respiratory syncytial virus (RSV) infection. °How do I know if I have this infection? °Most of the time this infection causes: °· A stuffy or runny nose. °· Yellow or green fluid in the nose. °· A cough. °· Sneezing. °· Tiredness (fatigue). °· Achy muscles. °· A sore throat. °· Sweating or chills. °· A fever. °· A headache. °How is this infection treated? °If the flu is diagnosed early, it may be treated with an antiviral medicine. This medicine shortens the length of time a person has symptoms. Symptoms may be treated with over-the-counter and prescription medicines, such as: °· Expectorants. These make it easier to cough up mucus. °· Decongestant nasal sprays. °Doctors do not prescribe antibiotic medicines for viral infections. They do not work with this kind of infection. °How do I know if I should stay home? °To keep others from getting sick, stay home if you have: °· A fever. °· A lasting cough. °· A sore throat. °· A runny nose. °· Sneezing. °· Muscles aches. °· Headaches. °· Tiredness. °· Weakness. °· Chills. °· Sweating. °· An upset stomach (nausea). °Follow these instructions at home: °· Rest as much as possible. °· Take over-the-counter and prescription medicines only as told by your doctor. °· Drink enough fluid to keep your pee (urine) clear or pale yellow. °· Gargle with salt water. Do this 3-4 times per day or as needed. To make a salt-water mixture, dissolve ½-1 tsp of salt in 1 cup of warm water. Make sure the salt dissolves all the way. °· Use nose drops made from salt water. This helps with stuffiness (congestion). It also helps soften the skin around your nose. °· Do not drink alcohol. °· Do not use  tobacco products, including cigarettes, chewing tobacco, and e-cigarettes. If you need help quitting, ask your doctor. °Get help if: °· Your symptoms last for 10 days or longer. °· Your symptoms get worse over time. °· You have a fever. °· You have very bad pain in your face or forehead. °· Parts of your jaw or neck become very swollen. °Get help right away if: °· You feel pain or pressure in your chest. °· You have shortness of breath. °· You faint or feel like you will faint. °· You keep throwing up (vomiting). °· You feel confused. °This information is not intended to replace advice given to you by your health care provider. Make sure you discuss any questions you have with your health care provider. °Document Released: 07/12/2008 Document Revised: 01/05/2016 Document Reviewed: 01/05/2015 °© 2017 Elsevier ° °

## 2016-08-16 NOTE — Progress Notes (Signed)
HPI  Pt presents to the clinic today with c/o runny nose, sore throat and cough. This started 2 days ago. He is blowing clear mucous out of his nose. He reports his throat is scratchy, but denies difficulty swallowing. The cough is dry and nonproductive. He has run fevers up to 100.0, and had chills but denies body aches. He has tried Flonase, Barista and Tylenol with minimal relief. He has a history of seasonal allergies. He has not had sick contacts that he is aware of. He is UTD with his flu and pneumonia vaccines.   Review of Systems        Past Medical History:  Diagnosis Date  . Allergic rhinitis   . 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 BCC (basal cell carcinoma of skin) 2014  . Carotid bruit    Right. Carotid u/s 2/11 normal.  . Diverticulosis   . HTN (hypertension)   . Hyperlipidemia   . Prostate cancer ([place])   . Pulmonary nodule    Stable on CT 2/11  . Renal stones   . S/P CABG x 1   . Skin cancer    scalp 2007    Family History  Problem Relation Age of Onset  . Heart failure Mother     died age 51  . Hypertension Mother   . Heart disease Mother     CHF  . Kidney failure Father   . Heart disease Father     coronary disease, CABG  . Stroke Father     due to "infection" from [place]  . Kidney disease Father     chronic  . Diabetes Other   . Diabetes Other   . Hyperlipidemia Daughter   . Colon cancer Neg Hx   . Esophageal cancer Neg Hx   . Stomach cancer Neg Hx   . Rectal cancer Neg Hx     Social History   Social History  . Marital status: Married    Spouse name: N/A  . Number of children: 3  . Years of education: N/A   Occupational History  . Retired Retired    [employer] in [place]  . Smoking status: Former Smoker    Packs/day: 1.00    Years: 25.00    Types: Cigarettes    Quit date: 07/10/1994  . Smokeless tobacco: Never Used     Comment: quit 1995  .  Alcohol use 0.6 oz/week    1 Standard drinks or equivalent per week     Comment: occassionally wine  . Drug use: No  . Sexual activity: Yes   Other Topics Concern  . Not on file   Social History Narrative   Married 1964    Allergies  Allergen Reactions  . Lactose Intolerance (Gi)      Constitutional: Positive fatigue and fever. Denies headache, abrupt weight changes.  HEENT:  Positive runny nose, sore throat. Denies eye redness, eye pain, pressure behind the eyes, facial pain, nasal congestion, ear pain, ringing in the ears, wax buildup, or bloody nose. Respiratory: Positive cough. Denies difficulty breathing or shortness of breath.  Cardiovascular: Denies chest pain, chest tightness, palpitations or swelling in the hands or feet.   No other specific complaints in a complete review of systems (except as listed in HPI above).  Objective:   BP 140/86   Pulse 79   Temp 100.2 F (37.9 C) (Oral)  Wt 171 lb (77.6 kg)   SpO2 96%   BMI 24.19 kg/m   Wt Readings from Last 3 Encounters:  08/16/16 171 lb (77.6 kg)  08/15/16 169 lb 12 oz (77 kg)  04/03/16 171 lb 12 oz (77.9 kg)     General: Appears his stated age, ill appearing in NAD. HEENT: Head: normal shape and size, no sinus tenderness noted; Eyes: sclera white, no icterus, conjunctiva pink; Ears: Tm's pink but intact, normal light reflex, + serous effusion bilaterally; Nose: mucosa pink and moist, septum midline; Throat/Mouth: Teeth present, mucosa erythematous and moist, no exudate noted, no lesions or ulcerations noted.  Neck: No cervical lymphadenopathy.  Pulmonary/Chest: Normal effort and positive vesicular breath sounds with intermittent expiratory wheeze. No respiratory distress. No rales or ronchi noted.       Assessment & Plan:   Flu like illness:  RST: negative We are unable to do a rapid flu due to currently being out of test Get some rest and drink plenty of water Do salt water gargles for the sore  throat 80 mg Depo IM today Continue Flonase for now eRx for Tamiflu BID x 5 days Delsym as needed for cough  RTC as needed or if symptoms persist.   Webb Silversmith, NP

## 2016-08-21 ENCOUNTER — Inpatient Hospital Stay (HOSPITAL_COMMUNITY)
Admission: EM | Admit: 2016-08-21 | Discharge: 2016-08-27 | DRG: 193 | Disposition: A | Discharge status: Home / Self Care | Payer: Medicare Other | Attending: Internal Medicine | Admitting: Internal Medicine

## 2016-08-21 ENCOUNTER — Encounter: Payer: Self-pay | Admitting: Family Medicine

## 2016-08-21 ENCOUNTER — Ambulatory Visit (INDEPENDENT_AMBULATORY_CARE_PROVIDER_SITE_OTHER): Payer: Medicare Other | Admitting: Family Medicine

## 2016-08-21 ENCOUNTER — Encounter (HOSPITAL_COMMUNITY): Payer: Self-pay

## 2016-08-21 ENCOUNTER — Emergency Department (HOSPITAL_COMMUNITY): Payer: Medicare Other

## 2016-08-21 DIAGNOSIS — E781 Pure hyperglyceridemia: Secondary | ICD-10-CM | POA: Diagnosis present

## 2016-08-21 DIAGNOSIS — Z87891 Personal history of nicotine dependence: Secondary | ICD-10-CM | POA: Diagnosis not present

## 2016-08-21 DIAGNOSIS — I10 Essential (primary) hypertension: Secondary | ICD-10-CM | POA: Diagnosis not present

## 2016-08-21 DIAGNOSIS — J1008 Influenza due to other identified influenza virus with other specified pneumonia: Secondary | ICD-10-CM | POA: Diagnosis present

## 2016-08-21 DIAGNOSIS — Z8249 Family history of ischemic heart disease and other diseases of the circulatory system: Secondary | ICD-10-CM

## 2016-08-21 DIAGNOSIS — I2510 Atherosclerotic heart disease of native coronary artery without angina pectoris: Secondary | ICD-10-CM | POA: Diagnosis present

## 2016-08-21 DIAGNOSIS — Z7951 Long term (current) use of inhaled steroids: Secondary | ICD-10-CM

## 2016-08-21 DIAGNOSIS — I428 Other cardiomyopathies: Secondary | ICD-10-CM | POA: Diagnosis present

## 2016-08-21 DIAGNOSIS — Z79899 Other long term (current) drug therapy: Secondary | ICD-10-CM | POA: Diagnosis not present

## 2016-08-21 DIAGNOSIS — E785 Hyperlipidemia, unspecified: Secondary | ICD-10-CM | POA: Diagnosis present

## 2016-08-21 DIAGNOSIS — Z8546 Personal history of malignant neoplasm of prostate: Secondary | ICD-10-CM

## 2016-08-21 DIAGNOSIS — J9601 Acute respiratory failure with hypoxia: Secondary | ICD-10-CM | POA: Diagnosis not present

## 2016-08-21 DIAGNOSIS — R0902 Hypoxemia: Secondary | ICD-10-CM | POA: Insufficient documentation

## 2016-08-21 DIAGNOSIS — I7100 Dissection of unspecified site of aorta: Secondary | ICD-10-CM | POA: Diagnosis present

## 2016-08-21 DIAGNOSIS — R05 Cough: Secondary | ICD-10-CM | POA: Diagnosis present

## 2016-08-21 DIAGNOSIS — J189 Pneumonia, unspecified organism: Secondary | ICD-10-CM

## 2016-08-21 DIAGNOSIS — Z841 Family history of disorders of kidney and ureter: Secondary | ICD-10-CM

## 2016-08-21 DIAGNOSIS — Z823 Family history of stroke: Secondary | ICD-10-CM

## 2016-08-21 DIAGNOSIS — I429 Cardiomyopathy, unspecified: Secondary | ICD-10-CM | POA: Diagnosis present

## 2016-08-21 DIAGNOSIS — J159 Unspecified bacterial pneumonia: Secondary | ICD-10-CM | POA: Diagnosis present

## 2016-08-21 DIAGNOSIS — J1100 Influenza due to unidentified influenza virus with unspecified type of pneumonia: Secondary | ICD-10-CM | POA: Diagnosis not present

## 2016-08-21 DIAGNOSIS — E784 Other hyperlipidemia: Secondary | ICD-10-CM | POA: Diagnosis not present

## 2016-08-21 DIAGNOSIS — Z87442 Personal history of urinary calculi: Secondary | ICD-10-CM

## 2016-08-21 DIAGNOSIS — Z85828 Personal history of other malignant neoplasm of skin: Secondary | ICD-10-CM | POA: Diagnosis not present

## 2016-08-21 DIAGNOSIS — I110 Hypertensive heart disease with heart failure: Secondary | ICD-10-CM | POA: Diagnosis present

## 2016-08-21 DIAGNOSIS — D72829 Elevated white blood cell count, unspecified: Secondary | ICD-10-CM | POA: Diagnosis not present

## 2016-08-21 DIAGNOSIS — R911 Solitary pulmonary nodule: Secondary | ICD-10-CM | POA: Diagnosis present

## 2016-08-21 DIAGNOSIS — J181 Lobar pneumonia, unspecified organism: Secondary | ICD-10-CM | POA: Diagnosis present

## 2016-08-21 DIAGNOSIS — Z951 Presence of aortocoronary bypass graft: Secondary | ICD-10-CM

## 2016-08-21 DIAGNOSIS — I5030 Unspecified diastolic (congestive) heart failure: Secondary | ICD-10-CM | POA: Diagnosis present

## 2016-08-21 DIAGNOSIS — I493 Ventricular premature depolarization: Secondary | ICD-10-CM | POA: Diagnosis present

## 2016-08-21 DIAGNOSIS — I359 Nonrheumatic aortic valve disorder, unspecified: Secondary | ICD-10-CM | POA: Diagnosis present

## 2016-08-21 DIAGNOSIS — E739 Lactose intolerance, unspecified: Secondary | ICD-10-CM | POA: Diagnosis present

## 2016-08-21 DIAGNOSIS — Z8679 Personal history of other diseases of the circulatory system: Secondary | ICD-10-CM

## 2016-08-21 DIAGNOSIS — Z9981 Dependence on supplemental oxygen: Secondary | ICD-10-CM

## 2016-08-21 DIAGNOSIS — Z833 Family history of diabetes mellitus: Secondary | ICD-10-CM

## 2016-08-21 DIAGNOSIS — E7849 Other hyperlipidemia: Secondary | ICD-10-CM | POA: Insufficient documentation

## 2016-08-21 DIAGNOSIS — Z7982 Long term (current) use of aspirin: Secondary | ICD-10-CM | POA: Diagnosis not present

## 2016-08-21 DIAGNOSIS — R0602 Shortness of breath: Secondary | ICD-10-CM | POA: Diagnosis not present

## 2016-08-21 DIAGNOSIS — R059 Cough, unspecified: Secondary | ICD-10-CM | POA: Diagnosis present

## 2016-08-21 LAB — I-STAT TROPONIN, ED: TROPONIN I, POC: 0 ng/mL (ref 0.00–0.08)

## 2016-08-21 LAB — CBC WITH DIFFERENTIAL/PLATELET
BASOS ABS: 0.1 10*3/uL (ref 0.0–0.1)
Basophils Relative: 1 %
Eosinophils Absolute: 0 10*3/uL (ref 0.0–0.7)
Eosinophils Relative: 0 %
HCT: 45.2 % (ref 39.0–52.0)
Hemoglobin: 15 g/dL (ref 13.0–17.0)
LYMPHS ABS: 1.5 10*3/uL (ref 0.7–4.0)
LYMPHS PCT: 10 %
MCH: 29.8 pg (ref 26.0–34.0)
MCHC: 33.2 g/dL (ref 30.0–36.0)
MCV: 89.7 fL (ref 78.0–100.0)
Monocytes Absolute: 3.1 10*3/uL — ABNORMAL HIGH (ref 0.1–1.0)
Monocytes Relative: 21 %
NEUTROS PCT: 68 %
Neutro Abs: 10.2 10*3/uL — ABNORMAL HIGH (ref 1.7–7.7)
Platelets: 197 10*3/uL (ref 150–400)
RBC: 5.04 MIL/uL (ref 4.22–5.81)
RDW: 13.8 % (ref 11.5–15.5)
WBC: 14.9 10*3/uL — AB (ref 4.0–10.5)

## 2016-08-21 LAB — PROCALCITONIN

## 2016-08-21 LAB — BASIC METABOLIC PANEL
Anion gap: 9 (ref 5–15)
BUN: 24 mg/dL — ABNORMAL HIGH (ref 6–20)
CALCIUM: 9 mg/dL (ref 8.9–10.3)
CHLORIDE: 105 mmol/L (ref 101–111)
CO2: 28 mmol/L (ref 22–32)
CREATININE: 0.81 mg/dL (ref 0.61–1.24)
GFR calc non Af Amer: >60 mL/min (ref >60)
Glucose, Bld: 122 mg/dL — ABNORMAL HIGH (ref 65–99)
Potassium: 4 mmol/L (ref 3.5–5.1)
SODIUM: 142 mmol/L (ref 135–145)

## 2016-08-21 LAB — URINALYSIS, ROUTINE W REFLEX MICROSCOPIC
BACTERIA UA: NONE SEEN
BILIRUBIN URINE: NEGATIVE
Glucose, UA: NEGATIVE mg/dL
Hgb urine dipstick: NEGATIVE
KETONES UR: NEGATIVE mg/dL
Leukocytes, UA: NEGATIVE
NITRITE: NEGATIVE
Protein, ur: 100 mg/dL — AB
SPECIFIC GRAVITY, URINE: 1.025 (ref 1.005–1.030)
Squamous Epithelial / LPF: NONE SEEN
pH: 5 (ref 5.0–8.0)

## 2016-08-21 LAB — MAGNESIUM: Magnesium: 2.1 mg/dL (ref 1.7–2.4)

## 2016-08-21 LAB — INFLUENZA PANEL BY PCR (TYPE A & B)
INFLBPCR: NEGATIVE
Influenza A By PCR: NEGATIVE

## 2016-08-21 LAB — BRAIN NATRIURETIC PEPTIDE: B Natriuretic Peptide: 252.3 pg/mL — ABNORMAL HIGH (ref 0.0–100.0)

## 2016-08-21 MED ORDER — IPRATROPIUM-ALBUTEROL 0.5-2.5 (3) MG/3ML IN SOLN: 3.0000 mL | RESPIRATORY_TRACT | Status: DC | PRN

## 2016-08-21 MED ORDER — ATORVASTATIN CALCIUM 20 MG PO TABS
20.0000 mg | ORAL_TABLET | Freq: Every day | ORAL | Status: DC
  Administered 2016-08-21 – 2016-08-26 (×6): 20 mg via ORAL
  Filled 2016-08-21 (×6): qty 1

## 2016-08-21 MED ORDER — MECLIZINE HCL 25 MG PO TABS: 25.0000 mg | ORAL_TABLET | Freq: Three times a day (TID) | ORAL | Status: DC | PRN

## 2016-08-21 MED ORDER — DEXTROSE 5 % IV SOLN
500.0000 mg | Freq: Once | INTRAVENOUS | Status: AC
  Administered 2016-08-21: 500 mg via INTRAVENOUS
  Filled 2016-08-21: qty 500

## 2016-08-21 MED ORDER — NEBIVOLOL HCL 2.5 MG PO TABS
10.0000 mg | ORAL_TABLET | Freq: Two times a day (BID) | ORAL | Status: DC
  Administered 2016-08-21 – 2016-08-27 (×12): 10 mg via ORAL
  Filled 2016-08-21 (×2): qty 2
  Filled 2016-08-21: qty 4
  Filled 2016-08-21: qty 1
  Filled 2016-08-21 (×2): qty 4
  Filled 2016-08-21: qty 1
  Filled 2016-08-21: qty 4
  Filled 2016-08-21: qty 1
  Filled 2016-08-21: qty 4
  Filled 2016-08-21: qty 1
  Filled 2016-08-21: qty 4
  Filled 2016-08-21 (×4): qty 2
  Filled 2016-08-21 (×2): qty 1
  Filled 2016-08-21: qty 4
  Filled 2016-08-21: qty 1

## 2016-08-21 MED ORDER — IPRATROPIUM-ALBUTEROL 0.5-2.5 (3) MG/3ML IN SOLN: 3.0000 mL | RESPIRATORY_TRACT | Status: DC

## 2016-08-21 MED ORDER — GUAIFENESIN ER 600 MG PO TB12: 600.0000 mg | ORAL_TABLET | Freq: Two times a day (BID) | ORAL | Status: DC | PRN

## 2016-08-21 MED ORDER — SODIUM CHLORIDE 0.9 % IV SOLN
INTRAVENOUS | Status: DC
  Administered 2016-08-21 – 2016-08-22 (×2): via INTRAVENOUS
  Administered 2016-08-23 – 2016-08-24 (×2): 50 mL/h via INTRAVENOUS
  Administered 2016-08-24: 06:00:00 via INTRAVENOUS

## 2016-08-21 MED ORDER — ALBUTEROL SULFATE HFA 108 (90 BASE) MCG/ACT IN AERS: 2.0000 | INHALATION_SPRAY | Freq: Four times a day (QID) | RESPIRATORY_TRACT | Status: DC | PRN

## 2016-08-21 MED ORDER — ENOXAPARIN SODIUM 40 MG/0.4ML ~~LOC~~ SOLN
40.0000 mg | SUBCUTANEOUS | Status: DC
  Administered 2016-08-22 – 2016-08-26 (×5): 40 mg via SUBCUTANEOUS
  Filled 2016-08-21 (×5): qty 0.4

## 2016-08-21 MED ORDER — AMLODIPINE BESYLATE 2.5 MG PO TABS
2.5000 mg | ORAL_TABLET | Freq: Two times a day (BID) | ORAL | Status: DC
  Administered 2016-08-21 – 2016-08-27 (×12): 2.5 mg via ORAL
  Filled 2016-08-21 (×12): qty 1

## 2016-08-21 MED ORDER — FLUTICASONE PROPIONATE HFA 110 MCG/ACT IN AERO
1.0000 | INHALATION_SPRAY | Freq: Two times a day (BID) | RESPIRATORY_TRACT | Status: DC
Start: 2016-08-21 — End: 2016-08-21

## 2016-08-21 MED ORDER — ALBUTEROL SULFATE (2.5 MG/3ML) 0.083% IN NEBU: 2.5000 mg | INHALATION_SOLUTION | RESPIRATORY_TRACT | Status: DC | PRN

## 2016-08-21 MED ORDER — PROMETHAZINE HCL 25 MG PO TABS: 25.0000 mg | ORAL_TABLET | Freq: Four times a day (QID) | ORAL | Status: DC | PRN

## 2016-08-21 MED ORDER — DEXTROSE 5 % IV SOLN
1.0000 g | Freq: Once | INTRAVENOUS | Status: AC
  Administered 2016-08-21: 1 g via INTRAVENOUS
  Filled 2016-08-21: qty 10

## 2016-08-21 MED ORDER — DEXTROSE 5 % IV SOLN
1.0000 g | Freq: Once | INTRAVENOUS | Status: AC
  Administered 2016-08-22: 1 g via INTRAVENOUS
  Filled 2016-08-21: qty 10

## 2016-08-21 NOTE — Patient Instructions (Signed)
To ER

## 2016-08-21 NOTE — ED Provider Notes (Signed)
[facility] DEPT Provider Note   CSN: [id number] Arrival date & time: 08/21/16  1220     History   Chief Complaint Chief Complaint  Patient presents with  . Shortness of Breath    HPI Ray Fowler is a 76 y.o. male.  HPI 76 year old male who presents with cough and shortness of breath. 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 States onset of congestion, cough, and nasal drainage one week ago. Was seen in the PCPs office by nurse practitioner and diagnosed with potential flu, and started on Tamiflu. States that he has finished his course of Tamiflu, and has not gotten any better. Has had fever of 100.4 Fahrenheit last week, and continues to have subjective fevers and chills. No chest pain. Has had minimal ankle edema, no orthopnea or PND. No abdominal pain, nausea, vomiting, diarrhea, or urinary complaints.  Patient was seen in his PCPs office again today, where he was noted to be hypoxic at 86% on room air. Was placed on 4 L nasal cannula and sent to the ED for evaluation.   Past Medical History:  Diagnosis Date  . Allergic rhinitis   . 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 BCC (basal cell carcinoma of skin) 2014  . Carotid bruit    Right. Carotid u/s 2/11 normal.  . Diverticulosis   . HTN (hypertension)   . Hyperlipidemia   . Prostate cancer ([place])   . Pulmonary nodule    Stable on CT 2/11  . Renal stones   . S/P CABG x 1   . Skin cancer    scalp 2007    Patient Active Problem List   Diagnosis Date Noted  . Hypoxia 08/21/2016  . Cough 06/07/2015  . Hematuria 05/17/2015  . Advance care planning 08/09/2014  . Benign paroxysmal positional vertigo 06/06/2014  . Hyperlipidemia 08/19/2012  . Medicare annual wellness visit, subsequent 07/18/2012  . History of  prostate cancer 07/18/2012  . Seasonal allergies 07/18/2012  . H/O ascending aorta repair 10/27/2011  . Dissecting aortic aneurysm (any part), thoracic ([place]) 09/17/2011  . PREMATURE VENTRICULAR CONTRACTIONS, FREQUENT 11/22/2009  . BRUIT 05/27/2009  . 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 Atrial flutter ([place]) 05/11/2009  . AORTIC DISSECTION 04/25/2009  . CAD (coronary artery disease) 04/03/2009  . Aortic valve disorder 04/03/2009  . HYPERTRIGLYCERIDEMIA 05/07/2007  . Essential hypertension 05/07/2007    Past Surgical History:  Procedure Laterality Date  . ABDOMINAL AORTIC ANEURYSM REPAIR  march 2013  . 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 CARDIAC SURGERY  2013   at [facility]- transverse aortic arch/descending aneurysm repair s/p debranching procedure  (total of 5 Gore C-TAG devices extending from distal descending aorta to his prev placed ascending dacron graft)  . COLONOSCOPY    . Flex- Polyp   06/1999   at 38cm, Int/Ext Hemm divertics [facility])  . Hemi Arch Replacement  03/2009 and repeat 10/2011   and AoV resuspension w/ Repl of Ascending Aorta and Hemiarch w/ Graft; Pericardial Tamponade Aortic Regurg 8/22-8/28/10;  endovascular repair of aortic dilation 2013 with  ascending aorta to L common carotid, R subclavian, R common carotid and L carotid subclavian bypass  . [facility]  08/22-8/28/2010   Thoracic aortic dissection pericardial teamponade aortic regurg  . PENILE PROSTHESIS IMPLANT  1996  .  POLYPECTOMY    . PROSTATECTOMY  12/1993   Dr. Risa Grill       Home Medications    Prior to Admission medications   Medication Sig Start Date End Date Taking? Authorizing Provider  albuterol (PROVENTIL HFA;VENTOLIN HFA) 108 (90 Base) MCG/ACT inhaler Inhale 2 puffs into the lungs every 6 (six) hours as needed for wheezing or shortness of breath. 10/27/15   Tonia Ghent, MD  amLODipine (NORVASC) 2.5 MG tablet Take 1 tablet (2.5 mg total)  by mouth 2 (two) times daily. 04/03/16   Minna Merritts, MD  aspirin 81 MG tablet Take one tablet every other day.    Historical Provider, MD  atorvastatin (LIPITOR) 20 MG tablet Take 1 tablet (20 mg total) by mouth at bedtime. 04/03/16   Minna Merritts, MD  fluticasone (FLONASE) 50 MCG/ACT nasal spray USE TWO SPRAYS IN [facility] NOSTRIL DAILY 11/18/15   Tonia Ghent, MD  fluticasone (FLOVENT HFA) 110 MCG/ACT inhaler INHALE ONE PUFF BY MOUTH TWICE DAILY. RINSE MOUTH AFTER USE.. 10/27/15   Tonia Ghent, MD  guaiFENesin (MUCINEX) 600 MG 12 hr tablet Take by mouth 2 (two) times daily as needed.    Historical Provider, MD  meclizine (ANTIVERT) 25 MG tablet Take 1 tablet (25 mg total) by mouth 3 (three) times daily as needed. 04/05/15   Minna Merritts, MD  Multiple Vitamin (MULITIVITAMIN WITH MINERALS) TABS Take 1 tablet by mouth daily.    Historical Provider, MD  Multiple Vitamins-Minerals (MACULAR VITAMIN BENEFIT) TABS Take 1 tablet by mouth daily. Lutein 25 mg and Zeaxanthin 5 mg    Historical Provider, MD  nebivolol (BYSTOLIC) 10 MG tablet Take 1 tablet (10 mg total) by mouth 2 (two) times daily. 04/03/16   Minna Merritts, MD  oseltamivir (TAMIFLU) 75 MG capsule Take 1 capsule (75 mg total) by mouth 2 (two) times daily. 08/16/16   Jearld Fenton, NP  promethazine (PHENERGAN) 25 MG tablet Take 1 tablet (25 mg total) by mouth every 6 (six) hours as needed for nausea or vomiting. 04/05/15   Minna Merritts, MD    Family History Family History  Problem Relation Age of Onset  . Heart failure Mother     died age 8  . Hypertension Mother   . Heart disease Mother     CHF  . Kidney failure Father   . Heart disease Father     coronary disease, CABG  . Stroke Father     due to "infection" from [place]  . Kidney disease Father     chronic  . Diabetes Other   . Diabetes Other   . Hyperlipidemia Daughter   . Colon cancer Neg Hx   . Esophageal cancer Neg Hx   . Stomach cancer Neg Hx   . Rectal  cancer Neg Hx     Social History Social History  Substance Use Topics  . Smoking status: Former Smoker    Packs/day: 1.00    Years: 25.00    Types: Cigarettes    Quit date: 07/10/1994  . Smokeless tobacco: Never Used     Comment: quit 1995  . Alcohol use 0.6 oz/week    1 Standard drinks or equivalent per week     Comment: occassionally wine     Allergies   Lactose intolerance (gi)   Review of Systems Review of Systems 10/14 systems reviewed and are negative other than those stated in the HPI    Physical Exam Updated Vital Signs BP  153/81   Pulse (!) 40   Resp 17   SpO2 95%   Physical Exam Physical Exam  Nursing note and vitals reviewed. Constitutional:  non-toxic, and in no acute distress Head: Normocephalic and atraumatic. Simple face mask on. Mouth/Throat: Oropharynx is clear and moist.  Neck: Normal range of motion. Neck supple.  Cardiovascular: Normal rate and regular rhythm.  no edema Pulmonary/Chest: Effort normal. Breath sounds grossly normal Abdominal: Soft. There is no tenderness. There is no rebound and no guarding.  Musculoskeletal: Normal range of motion.  Neurological: Alert, no facial droop, fluent speech, moves all extremities symmetrically Skin: Skin is warm and dry.  Psychiatric: Cooperative  ED Treatments / Results  Labs (all labs ordered are listed, but only abnormal results are displayed) Labs Reviewed  CBC WITH DIFFERENTIAL/PLATELET - Abnormal; Notable for the following:       Result Value   WBC 14.9 (*)    All other components within normal limits  BASIC METABOLIC PANEL - Abnormal; Notable for the following:    Glucose, Bld 122 (*)    BUN 24 (*)    All other components within normal limits  BRAIN NATRIURETIC PEPTIDE - Abnormal; Notable for the following:    B Natriuretic Peptide 252.3 (*)    All other components within normal limits  MAGNESIUM  INFLUENZA PANEL BY PCR (TYPE A & B, H1N1)  I-STAT TROPOININ, ED    EKG  EKG  Interpretation  Date/Time:  Tuesday August 21 2016 12:34:39 EST Ventricular Rate:  101 PR Interval:    QRS Duration: 98 QT Interval:  380 QTC Calculation: 447 R Axis:   62 Text Interpretation:  Sinus tachycardia Ventricular bigeminy Probable left atrial enlargement Minimal ST depression, anterolateral leads bigeminy new  Confirmed by LIU MD, DANA [id number]) on 08/21/2016 12:50:01 PM       Radiology Dg Chest 2 View  Result Date: 08/21/2016 CLINICAL DATA:  Cough with hypoxia and shortness of breath. EXAM: CHEST  2 VIEW COMPARISON:  10/27/2015 FINDINGS: Again noted are aortic stent grafts involving the ascending thoracic aorta, aortic arch and descending thoracic aorta. There are patchy densities in the mid and lower lungs bilaterally. There appears to be extensive consolidation or airspace disease in the left retrocardiac region. Upper lungs are clear. Heart size is within normal limits and stable. Upper mediastinum is prominent but stable. Again noted are surgical clips in the upper chest. IMPRESSION: Patchy densities in both lower lungs. Findings are suggestive for bilateral pneumonia. Electronically Signed   By: Markus Daft M.D.   On: 08/21/2016 13:24    Procedures Procedures (including critical care time)  Medications Ordered in ED Medications  azithromycin (ZITHROMAX) 500 mg in dextrose 5 % 250 mL IVPB (500 mg Intravenous New Bag/Given 08/21/16 1435)  cefTRIAXone (ROCEPHIN) 1 g in dextrose 5 % 50 mL IVPB (1 g Intravenous New Bag/Given 08/21/16 1430)     Initial Impression / Assessment and Plan / ED Course  I have reviewed the triage vital signs and the nursing notes.  Pertinent labs & imaging results that were available during my care of the patient were reviewed by me and considered in my medical decision making (see chart for details).  Clinical Course     History of aortic aneurysm repair who presents with cough and shortness of breath. With acute hypoxic respiratory failure due to  community-acquired pneumonia. He does have 4-5 L oxygen requirement in the ED, but otherwise in no significant respiratory distress with supplemental oxygen. Chest x-ray  visualized and shows evidence of retrocardiac opacity, suggestive of bilateral lobar pneumonia. Will start ceftriaxone and azithromycin for community-acquired ammonia. Blood work notable for leukocytosis of 14. Given oxygen requirement, will admit to medicine service for ongoing management.  Final Clinical Impressions(s) / ED Diagnoses   Final diagnoses:  Acute respiratory failure with hypoxia ([place])  Lobar pneumonia ([place])  Community acquired pneumonia, unspecified laterality    New Prescriptions New Prescriptions   No medications on file     Forde Dandy, MD 08/21/16 1504

## 2016-08-21 NOTE — Assessment & Plan Note (Signed)
Likely CAP, with irregular pulse/brady that improved with supplemental O2 use.  D/w pt.  EMS called, in route.  To ER.  All d/w pt.  >25 minutes spent in face to face time with patient, >50% spent in counselling or coordination of care.

## 2016-08-21 NOTE — Progress Notes (Signed)
Pre visit review using our clinic review tool, if applicable. No additional management support is needed unless otherwise documented below in the visit note. 

## 2016-08-21 NOTE — ED Notes (Addendum)
Ambulated pt in hall, pulse stayed between 85-90. Pt SpO2 dropped from 90% on 6 liters of oxygen to 86% notified Crystal(RN)

## 2016-08-21 NOTE — ED Triage Notes (Signed)
Pt brought in by EMS from MD office due to having an abnormal ekg and hypoxia. Pt is on simple mask with oxygen sat around 92% and having PVC's. Pt is a&ox4. Pt has hx of AAA and aotric arch rebuild. Pt was treated with tamiflu in last week.

## 2016-08-21 NOTE — ED Notes (Signed)
EKG given to Dr. Liu.  

## 2016-08-21 NOTE — Progress Notes (Signed)
Recently with cough, flu like sx, seen last week and started on tamiflu recently.  He wasn't able to be tested for flu since we didn't have any flu kits at that point.    Had checked pulse ox at home but didn't recall exact numbers; possibly with O2 sats in the mid to upper 80s recently.    He feels worse today than last week.  Exhausted after a shower.  Still with a cough.  No CP.  Fever noted day before yesterday.   Past Medical History:  Diagnosis Date  . Allergic rhinitis   . 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 BCC (basal cell carcinoma of skin) 2014  . Carotid bruit    Right. Carotid u/s 2/11 normal.  . Diverticulosis   . HTN (hypertension)   . Hyperlipidemia   . Prostate cancer ([place])   . Pulmonary nodule    Stable on CT 2/11  . Renal stones   . S/P CABG x 1   . Skin cancer    scalp 2007   Past Surgical History:  Procedure Laterality Date  . ABDOMINAL AORTIC ANEURYSM REPAIR  march 2013  . 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 CARDIAC SURGERY  2013   at [facility]- transverse aortic arch/descending aneurysm repair s/p debranching procedure  (total of 5 Gore C-TAG devices extending from distal descending aorta to his prev placed ascending dacron graft)  . COLONOSCOPY    . Flex- Polyp   06/1999   at 38cm, Int/Ext Hemm divertics [facility] Plan)  . Hemi Arch Replacement  03/2009 and repeat 10/2011   and AoV resuspension w/ Repl of Ascending Aorta and Hemiarch w/ Graft; Pericardial Tamponade Aortic Regurg 8/22-8/28/10;  endovascular repair of aortic dilation 2013 with  ascending aorta to L common carotid, R subclavian, R common carotid and L carotid subclavian bypass  . [facility]  08/22-8/28/2010   Thoracic aortic dissection pericardial teamponade aortic regurg  . PENILE PROSTHESIS IMPLANT  1996  . POLYPECTOMY    . PROSTATECTOMY  12/1993   Dr. Risa Grill   PMH and [facility] reviewed  ROS: Per HPI unless  specifically indicated in ROS section   Meds, vitals, and allergies reviewed.   Sitting in chair, speaking in complete sentences.  ncat Mmm Neck supple, no LA Pulse initially in the upper 40s.  Increased to 70s when placed on O2 at 4L, but pulse still irregular.   Diffuse coarse BS with crackles noted B.   Ext w/o edema.   Initial pulse ox ~86%, up to 90% on 4L O2.  No other meds given at OV.

## 2016-08-21 NOTE — H&P (Signed)
History and Physical    Ray Fowler [id number] DOB: 07/05/1941 DOA: 08/21/2016   PCP: Elsie Stain, MD   Patient coming from:  Home   Chief Complaint: SHortness of breath and cough   HPI: Ray Fowler is a 76 y.o. male with significant cardiac history as listed below, known history pulmonary nodule, stable since 2011, recent flu like symptoms treated as outpatient by his PCP with Tamiflu in 08/17/2015,finishing the course today, presenting today to his PCP with worsening symptoms, including fever up to 100.4, chills, shortness of breath, green mucous production with cough, myalgias. He was afebrile on presentation. As his doctor's office, the patient was noted to have oxygen saturation,  87% inRA. The patient is not on oxygen at home. EMS was called, the patient was brought to the ER, for further evaluation. At the ER, the chest x-ray was performed, suggestive of bilateral pneumonia. Influenza PCR is currently pending. Patient was placed on 5 L of oxygen mask, with improvement of his symptoms. He has afebrile, he is vital signs are stable, but he is still symptomatic for respiratory infection. He denies any headaches, sore throat, nausea, vomiting, or appetite changes. He denies any abdominal pain. He denies any lower extremity swelling. No confusion was reported.   ED Course:  BP 153/81   Pulse (!) 40   Resp 17   SpO2 95%    sodium 142 potassium 4.0 glucose 122 creatinine 0.8  BNP252 troponin 0 white count 14.9 hemoglobin 15, platelets 197  Smear  shows mild left shift at the ER he had a couple of PVCs, during coughing spells.  Review of Systems: As per HPI otherwise 10 point review of systems negative.   Past Medical History:  Diagnosis Date  . Allergic rhinitis   . 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 BCC (basal cell carcinoma of skin) 2014  . Carotid bruit    Right. Carotid u/s 2/11 normal.  . Diverticulosis   . HTN  (hypertension)   . Hyperlipidemia   . Prostate cancer ([place])   . Pulmonary nodule    Stable on CT 2/11  . Renal stones   . S/P CABG x 1   . Skin cancer    scalp 2007    Past Surgical History:  Procedure Laterality Date  . ABDOMINAL AORTIC ANEURYSM REPAIR  march 2013  . 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 CARDIAC SURGERY  2013   at [facility]- transverse aortic arch/descending aneurysm repair s/p debranching procedure  (total of 5 Gore C-TAG devices extending from distal descending aorta to his prev placed ascending dacron graft)  . COLONOSCOPY    . Flex- Polyp   06/1999   at 38cm, Int/Ext Hemm divertics [facility] Plan)  . Hemi Arch Replacement  03/2009 and repeat 10/2011   and AoV resuspension w/ Repl of Ascending Aorta and Hemiarch w/ Graft; Pericardial Tamponade Aortic Regurg 8/22-8/28/10;  endovascular repair of aortic dilation 2013 with  ascending aorta to L common carotid, R subclavian, R common carotid and L carotid subclavian bypass  . [facility]  08/22-8/28/2010   Thoracic aortic dissection pericardial teamponade aortic regurg  . PENILE PROSTHESIS IMPLANT  1996  . POLYPECTOMY    . PROSTATECTOMY  12/1993   Dr. Risa Grill    Social History Social History   Social History  . Marital status: Married    Spouse name: N/A  . Number  of children: 3  . Years of education: N/A   Occupational History  . Retired Retired    [employer] in [place]  . Smoking status: Former Smoker    Packs/day: 1.00    Years: 25.00    Types: Cigarettes    Quit date: 07/10/1994  . Smokeless tobacco: Never Used     Comment: quit 1995  . Alcohol use 0.6 oz/week    1 Standard drinks or equivalent per week     Comment: occassionally wine  . Drug use: No  . Sexual activity: Yes   Other Topics Concern  . Not on file   Social History Narrative   Married 1964     Allergies  Allergen Reactions  . Lactose  Intolerance ([place])     Family History  Problem Relation Age of Onset  . Heart failure Mother     died age 42  . Hypertension Mother   . Heart disease Mother     CHF  . Kidney failure Father   . Heart disease Father     coronary disease, CABG  . Stroke Father     due to "infection" from [place]  . Kidney disease Father     chronic  . Diabetes Other   . Diabetes Other   . Hyperlipidemia Daughter   . Colon cancer Neg Hx   . Esophageal cancer Neg Hx   . Stomach cancer Neg Hx   . Rectal cancer Neg Hx       Prior to Admission medications   Medication Sig Start Date End Date Taking? Authorizing Provider  albuterol (PROVENTIL HFA;VENTOLIN HFA) 108 (90 Base) MCG/ACT inhaler Inhale 2 puffs into the lungs every 6 (six) hours as needed for wheezing or shortness of breath. 10/27/15   Tonia Ghent, MD  amLODipine (NORVASC) 2.5 MG tablet Take 1 tablet (2.5 mg total) by mouth 2 (two) times daily. 04/03/16   Minna Merritts, MD  aspirin 81 MG tablet Take one tablet every other day.    Historical Provider, MD  atorvastatin (LIPITOR) 20 MG tablet Take 1 tablet (20 mg total) by mouth at bedtime. 04/03/16   Minna Merritts, MD  fluticasone (FLONASE) 50 MCG/ACT nasal spray USE TWO SPRAYS IN [facility] NOSTRIL DAILY 11/18/15   Tonia Ghent, MD  fluticasone (FLOVENT HFA) 110 MCG/ACT inhaler INHALE ONE PUFF BY MOUTH TWICE DAILY. RINSE MOUTH AFTER USE.. 10/27/15   Tonia Ghent, MD  guaiFENesin (MUCINEX) 600 MG 12 hr tablet Take by mouth 2 (two) times daily as needed.    Historical Provider, MD  meclizine (ANTIVERT) 25 MG tablet Take 1 tablet (25 mg total) by mouth 3 (three) times daily as needed. 04/05/15   Minna Merritts, MD  Multiple Vitamin (MULITIVITAMIN WITH MINERALS) TABS Take 1 tablet by mouth daily.    Historical Provider, MD  Multiple Vitamins-Minerals (MACULAR VITAMIN BENEFIT) TABS Take 1 tablet by mouth daily. Lutein 25 mg and Zeaxanthin 5 mg    Historical Provider, MD  nebivolol (BYSTOLIC) 10  MG tablet Take 1 tablet (10 mg total) by mouth 2 (two) times daily. 04/03/16   Minna Merritts, MD  oseltamivir (TAMIFLU) 75 MG capsule Take 1 capsule (75 mg total) by mouth 2 (two) times daily. 08/16/16   Jearld Fenton, NP  promethazine (PHENERGAN) 25 MG tablet Take 1 tablet (25 mg total) by mouth every 6 (six) hours as needed for nausea or vomiting. 04/05/15   Christia Reading  Gloriajean Dell, MD    Physical Exam:    Vitals:   08/21/16 1345 08/21/16 1400 08/21/16 1415 08/21/16 1430  BP: 151/82 134/70 146/82 153/81  Pulse: (!) 39 78 72 (!) 40  Resp: 17 18 21 17   SpO2: 96% 94% 92% 95%       Constitutional: NAD, calm, comfortable  Vitals:   08/21/16 1345 08/21/16 1400 08/21/16 1415 08/21/16 1430  BP: 151/82 134/70 146/82 153/81  Pulse: (!) 39 78 72 (!) 40  Resp: 17 18 21 17   SpO2: 96% 94% 92% 95%   Eyes: PERRL, lids and conjunctivae normal ENMT: Mucous membranes are moist. Posterior pharynx clear of any exudate or lesions.Normal dentition.  Neck: normal, supple, no masses, no thyromegaly Respiratory: decreased breath sounds at the bases , no wheezing, no crackles. Normal respiratory effort. No accessory muscle use.  Cardiovascular: Regular rate and rhythm, no murmurs / rubs / gallops. No extremity edema. 2+ pedal pulses. No carotid bruits.  Abdomen: no tenderness, no masses palpated. No hepatosplenomegaly. Bowel sounds positive.  Musculoskeletal: no clubbing / cyanosis. No joint deformity upper and lower extremities. Good ROM, no contractures. Normal muscle tone.  Skin: no rashes, lesions, ulcers.  Neurologic: CN 2-12 grossly intact. Sensation intact, DTR normal. Strength 5/5 in all 4.  Psychiatric: Normal judgment and insight. Alert and oriented x 3. Normal mood.     Labs on Admission: I have personally reviewed following labs and imaging studies  CBC:  Recent Labs Lab 08/21/16 1326  WBC 14.9*  NEUTROABS 10.2*  HGB 15.0  HCT 45.2  MCV 89.7  PLT [id number]    Basic Metabolic  Panel:  Recent Labs Lab 08/15/16 0933 08/21/16 1326  NA 142 142  K 3.9 4.0  CL 104 105  CO2 32 28  GLUCOSE 101* 122*  BUN 17 24*  CREATININE 0.90 0.81  CALCIUM 9.3 9.0  MG  --  2.1    GFR: Estimated Creatinine Clearance: 85.2 mL/min (by C-G formula based on SCr of 0.81 mg/dL).  Liver Function Tests:  Recent Labs Lab 08/15/16 0933  AST 25  ALT 23  ALKPHOS 59  BILITOT 1.0  PROT 7.3  ALBUMIN 4.5   No results for input(s): LIPASE, AMYLASE in the last 168 hours. No results for input(s): AMMONIA in the last 168 hours.  Coagulation Profile: No results for input(s): INR, PROTIME in the last 168 hours.  Cardiac Enzymes: No results for input(s): CKTOTAL, CKMB, CKMBINDEX, TROPONINI in the last 168 hours.  BNP (last 3 results) No results for input(s): PROBNP in the last 8760 hours.  HbA1C: No results for input(s): HGBA1C in the last 72 hours.  CBG: No results for input(s): GLUCAP in the last 168 hours.  Lipid Profile: No results for input(s): CHOL, HDL, LDLCALC, TRIG, CHOLHDL, LDLDIRECT in the last 72 hours.  Thyroid Function Tests: No results for input(s): TSH, T4TOTAL, FREET4, T3FREE, THYROIDAB in the last 72 hours.  Anemia Panel: No results for input(s): VITAMINB12, FOLATE, FERRITIN, TIBC, IRON, RETICCTPCT in the last 72 hours.  Urine analysis:    Component Value Date/Time   COLORURINE YELLOW 10/28/2011 0403   APPEARANCEUR CLEAR 10/28/2011 0403   LABSPEC 1.012 10/28/2011 0403   PHURINE 7.0 10/28/2011 0403   GLUCOSEU NEGATIVE 10/28/2011 0403   HGBUR NEGATIVE 10/28/2011 0403   HGBUR negative 02/18/2008 1221   BILIRUBINUR neg 05/31/2015 1543   KETONESUR NEGATIVE 10/28/2011 0403   PROTEINUR trace 05/31/2015 1543   PROTEINUR NEGATIVE 10/28/2011 0403   UROBILINOGEN negative 05/31/2015 1543  UROBILINOGEN 0.2 10/28/2011 0403   NITRITE neg 05/31/2015 1543   NITRITE NEGATIVE 10/28/2011 0403   LEUKOCYTESUR Negative 05/31/2015 1543    Sepsis  Labs: @LABRCNTIP (procalcitonin:4,lacticidven:4) )No results found for this or any previous visit (from the past 240 hour(s)).   Radiological Exams on Admission: Dg Chest 2 View  Result Date: 08/21/2016 CLINICAL DATA:  Cough with hypoxia and shortness of breath. EXAM: CHEST  2 VIEW COMPARISON:  10/27/2015 FINDINGS: Again noted are aortic stent grafts involving the ascending thoracic aorta, aortic arch and descending thoracic aorta. There are patchy densities in the mid and lower lungs bilaterally. There appears to be extensive consolidation or airspace disease in the left retrocardiac region. Upper lungs are clear. Heart size is within normal limits and stable. Upper mediastinum is prominent but stable. Again noted are surgical clips in the upper chest. IMPRESSION: Patchy densities in both lower lungs. Findings are suggestive for bilateral pneumonia. Electronically Signed   By: Markus Daft M.D.   On: 08/21/2016 13:24    EKG: Independently reviewed.  Assessment/Plan Active Problems:   Cough   Hypoxia   CAP (community acquired pneumonia)   HYPERTRIGLYCERIDEMIA   Essential hypertension   CAD (coronary artery disease)   Aortic valve disorder   Secondary cardiomyopathy (HCC)   Dissection of aorta (HCC)   Acute Respiratory Failure with Hypoxia likely due to  CAP  Recent [place] symptoms 1/4 finishing a course of Tamiflu Presenting now with ongoing / progressive symptoms including productive cough, fever up to 100.4, and O desats to the 87% requiring 5 L O2 . CXR today suggests bilateral PNA .  WBC  14. 1   Admit to telel obs  Oxygen   sputum and blood cultures  IV antibiotics with per protocol with Zithromax and Rocephin  Duonebs prn  Mucinex prn  Antipyretics Check results of influenza panel, may need therapy  Repeat CBC and CXR in am  in am   Diastolic CHF: No acute decompensation weight 76.2 kg BNP 250  Continue meds  Obtain daily weights Monitor intake and output  Hypertension BP  153/81      Controlled Continue home anti-hypertensive medications    Hyperlipidemia Continue home statins   DVT prophylaxis: Lovenox  Code Status:   Full     Family Communication:  Discussed with patient Disposition Plan: Expect patient to be discharged to home after condition improves Consults called:    None Admission status:Tele  Obs    Katrinka Herbison E, PA-C [facility]   08/21/2016, 3:54 PM

## 2016-08-21 NOTE — ED Notes (Signed)
Pt given [place] sandwich with apple sauce per Crystal(RN)

## 2016-08-22 ENCOUNTER — Inpatient Hospital Stay (HOSPITAL_COMMUNITY): Payer: Medicare Other

## 2016-08-22 DIAGNOSIS — D72829 Elevated white blood cell count, unspecified: Secondary | ICD-10-CM

## 2016-08-22 LAB — COMPREHENSIVE METABOLIC PANEL
ALBUMIN: 2.4 g/dL — AB (ref 3.5–5.0)
ALK PHOS: 66 U/L (ref 38–126)
ALT: 65 U/L — AB (ref 17–63)
AST: 51 U/L — AB (ref 15–41)
Anion gap: 8 (ref 5–15)
BILIRUBIN TOTAL: 0.8 mg/dL (ref 0.3–1.2)
BUN: 16 mg/dL (ref 6–20)
CALCIUM: 8.3 mg/dL — AB (ref 8.9–10.3)
CO2: 28 mmol/L (ref 22–32)
Chloride: 104 mmol/L (ref 101–111)
Creatinine, Ser: 0.74 mg/dL (ref 0.61–1.24)
GFR calc Af Amer: >60 mL/min (ref >60)
GFR calc non Af Amer: >60 mL/min (ref >60)
GLUCOSE: 108 mg/dL — AB (ref 65–99)
Potassium: 3.8 mmol/L (ref 3.5–5.1)
Sodium: 140 mmol/L (ref 135–145)
TOTAL PROTEIN: 6.1 g/dL — AB (ref 6.5–8.1)

## 2016-08-22 LAB — RESPIRATORY PANEL BY PCR
ADENOVIRUS-RVPPCR: NOT DETECTED
Bordetella pertussis: NOT DETECTED
CHLAMYDOPHILA PNEUMONIAE-RVPPCR: NOT DETECTED
CORONAVIRUS NL63-RVPPCR: NOT DETECTED
CORONAVIRUS OC43-RVPPCR: NOT DETECTED
Coronavirus 229E: NOT DETECTED
Coronavirus HKU1: NOT DETECTED
INFLUENZA A H3-RVPPCR: DETECTED — AB
INFLUENZA B-RVPPCR: NOT DETECTED
MYCOPLASMA PNEUMONIAE-RVPPCR: NOT DETECTED
Metapneumovirus: NOT DETECTED
PARAINFLUENZA VIRUS 1-RVPPCR: NOT DETECTED
Parainfluenza Virus 2: NOT DETECTED
Parainfluenza Virus 3: NOT DETECTED
Parainfluenza Virus 4: NOT DETECTED
RESPIRATORY SYNCYTIAL VIRUS-RVPPCR: NOT DETECTED
Rhinovirus / Enterovirus: NOT DETECTED

## 2016-08-22 LAB — CBC
HCT: 41.3 % (ref 39.0–52.0)
Hemoglobin: 13.4 g/dL (ref 13.0–17.0)
MCH: 29.5 pg (ref 26.0–34.0)
MCHC: 32.4 g/dL (ref 30.0–36.0)
MCV: 91 fL (ref 78.0–100.0)
Platelets: 197 10*3/uL (ref 150–400)
RBC: 4.54 MIL/uL (ref 4.22–5.81)
RDW: 14.1 % (ref 11.5–15.5)
WBC: 12.7 10*3/uL — ABNORMAL HIGH (ref 4.0–10.5)

## 2016-08-22 LAB — STREP PNEUMONIAE URINARY ANTIGEN: Strep Pneumo Urinary Antigen: NEGATIVE

## 2016-08-22 MED ORDER — ACETAMINOPHEN 325 MG PO TABS: 650.0000 mg | ORAL_TABLET | Freq: Four times a day (QID) | ORAL | Status: DC | PRN

## 2016-08-22 MED ORDER — DEXTROSE 5 % IV SOLN
1.0000 g | INTRAVENOUS | Status: DC
  Administered 2016-08-22 – 2016-08-26 (×5): 1 g via INTRAVENOUS
  Filled 2016-08-22 (×7): qty 10

## 2016-08-22 MED ORDER — DEXTROSE 5 % IV SOLN
500.0000 mg | INTRAVENOUS | Status: DC
  Administered 2016-08-22 – 2016-08-23 (×2): 500 mg via INTRAVENOUS
  Filled 2016-08-22 (×2): qty 500

## 2016-08-22 NOTE — Progress Notes (Signed)
Temp 100.0, no tylenol orders for pt, message sent to Dr Charlies Silvers asking for tylenol is she wishes for a temp 100.0.

## 2016-08-22 NOTE — Progress Notes (Signed)
At 1709 sats 87% RA--pt took O2 off to blow his nose, sats down to 87%, have attempted to decrease O2 today--was at 6L this am, now on 4L sats between 90-94%, orders to keep >92%, have left on 4L.

## 2016-08-22 NOTE — Progress Notes (Addendum)
Noted resp panel back, pt  Positive for influenza A H3. Text message sent to Dr Doyle Askew. ---corrected Dr Charlies Silvers also notified, as she is following today.

## 2016-08-22 NOTE — Progress Notes (Addendum)
Patient ID: Ray Fowler, male   DOB: 1941/01/14, 76 y.o.   MRN: [id number]  PROGRESS NOTE    Ray Fowler  [id number] DOB: 1940-11-08 DOA: 08/21/2016  PCP: Elsie Stain, MD   Brief Narrative:  76 y.o. male with history of pulmonary nodule, stable since 2011, recent flu like symptoms treated outpatient by his PCP with Tamiflu finishing the medication 1/9. Patient presented with worsening shortness of breath productive of green sputum, fevers, generalized weakness and myalgias. He was seen at [place] office and noted to have oxygen saturation of 87% on room air. He was brought to ER for further evaluation. On admission, patient was hemodynamically stable. Chest x-ray showed bilateral pneumonia. Influenza was negative.  Assessment & Plan:   Active Problems: Acute respiratory failure with hypoxia / left lower lobe pneumonia / leukocytosis - Hypoxia likely in the setting of pneumonia - Chest x-ray on admission showed dense left lower lobe pneumonia and right mid and lower lung infiltrates concerning for interstitial pneumonia - Azithromycin and Rocephin given in ED. Will resume those antibiotics from today - Respiratory virus panel is pending - Influenza is negative - Follow up blood culture results  Essential hypertension - Continue Norvasc 2.5 mg twice daily and Bystolic 10 mg twice daily  Dyslipidemia - Continue Lipitor    DVT prophylaxis: Lovenox subQ  Code Status: full code  Family Communication: No family at the bedside Disposition Plan: Home likely by 08/24/2016   Consultants:   None   Procedures:   None   Antimicrobials:   Azithromycin and Rocephin given in ED 1/9; 1/10 -->     Subjective: Feels tired.   Objective: Vitals:   08/21/16 1915 08/21/16 2001 08/22/16 0500 08/22/16 1100  BP: 135/65 (!) 162/57 (!) 144/73   Pulse: 73 (!) 41 67   Resp: 23 20    Temp:  98.9 F (37.2 C) 97.7 F (36.5 C)   TempSrc:  Oral Oral   SpO2: 91% (!) 88% 92%  94%  Weight:  73.3 kg (161 lb 11.2 oz)    Height:  5\' 10"  (1.778 m)      Intake/Output Summary (Last 24 hours) at 08/22/16 1127 Last data filed at 08/22/16 0930  Gross per 24 hour  Intake           696.25 ml  Output                0 ml  Net           696.25 ml   Filed Weights   08/21/16 2001  Weight: 73.3 kg (161 lb 11.2 oz)    Examination:  General exam: Appears calm and comfortable  Respiratory system: No wheezing, diminished breath sounds  Cardiovascular system: S1 & S2 heard, RRR. No pedal edema. Gastrointestinal system: Abdomen is nondistended, soft and nontender. No organomegaly or masses felt. Normal bowel sounds heard. Central nervous system: Alert and oriented. No focal neurological deficits. Extremities: Symmetric 5 x 5 power. Skin: No rashes, lesions or ulcers Psychiatry: Judgement and insight appear normal. Mood & affect appropriate.   Data Reviewed: I have personally reviewed following labs and imaging studies  CBC:  Recent Labs Lab 08/21/16 1326 08/22/16 0443  WBC 14.9* 12.7*  NEUTROABS 10.2*  --   HGB 15.0 13.4  HCT 45.2 41.3  MCV 89.7 91.0  PLT 197 [id number]   Basic Metabolic Panel:  Recent Labs Lab 08/21/16 1326 08/22/16 0443  NA 142 140  K 4.0 3.8  CL 105 104  CO2 28 28  GLUCOSE 122* 108*  BUN 24* 16  CREATININE 0.81 0.74  CALCIUM 9.0 8.3*  MG 2.1  --    GFR: Estimated Creatinine Clearance: 83.6 mL/min (by C-G formula based on SCr of 0.74 mg/dL). Liver Function Tests:  Recent Labs Lab 08/22/16 0443  AST 51*  ALT 65*  ALKPHOS 66  BILITOT 0.8  PROT 6.1*  ALBUMIN 2.4*   No results for input(s): LIPASE, AMYLASE in the last 168 hours. No results for input(s): AMMONIA in the last 168 hours. Coagulation Profile: No results for input(s): INR, PROTIME in the last 168 hours. Cardiac Enzymes: No results for input(s): CKTOTAL, CKMB, CKMBINDEX, TROPONINI in the last 168 hours. BNP (last 3 results) No results for input(s): PROBNP in the  last 8760 hours. HbA1C: No results for input(s): HGBA1C in the last 72 hours. CBG: No results for input(s): GLUCAP in the last 168 hours. Lipid Profile: No results for input(s): CHOL, HDL, LDLCALC, TRIG, CHOLHDL, LDLDIRECT in the last 72 hours. Thyroid Function Tests: No results for input(s): TSH, T4TOTAL, FREET4, T3FREE, THYROIDAB in the last 72 hours. Anemia Panel: No results for input(s): VITAMINB12, FOLATE, FERRITIN, TIBC, IRON, RETICCTPCT in the last 72 hours. Urine analysis:    Component Value Date/Time   COLORURINE AMBER (A) 08/21/2016 1655   APPEARANCEUR HAZY (A) 08/21/2016 1655   LABSPEC 1.025 08/21/2016 1655   PHURINE 5.0 08/21/2016 1655   GLUCOSEU NEGATIVE 08/21/2016 1655   HGBUR NEGATIVE 08/21/2016 1655   HGBUR negative 02/18/2008 1221   BILIRUBINUR NEGATIVE 08/21/2016 1655   BILIRUBINUR neg 05/31/2015 1543   KETONESUR NEGATIVE 08/21/2016 1655   PROTEINUR 100 (A) 08/21/2016 1655   UROBILINOGEN negative 05/31/2015 1543   UROBILINOGEN 0.2 10/28/2011 0403   NITRITE NEGATIVE 08/21/2016 1655   LEUKOCYTESUR NEGATIVE 08/21/2016 1655   Sepsis Labs: @LABRCNTIP (procalcitonin:4,lacticidven:4)   Recent Results (from the past 240 hour(s))  Culture, blood (routine x 2) Call MD if unable to obtain prior to antibiotics being given     Status: None (Preliminary result)   Collection Time: 08/21/16  8:17 PM  Result Value Ref Range Status   Specimen Description BLOOD RIGHT FOREARM  Final   Special Requests BOTTLES DRAWN AEROBIC AND ANAEROBIC 4CC EACH  Final   Culture PENDING  Incomplete   Report Status PENDING  Incomplete      Radiology Studies: Dg Chest 2 View Result Date: 08/22/2016 Dense left lower lobe pneumonia, stable. Patchy interstitial densities in the right mid and lower lung worrisome for early interstitial pneumonia. No CHF. Previously placed aortic stent graft throughout the entire course of the thoracic aorta.    Dg Chest 2 View Result Date: 08/21/2016 Patchy  densities in both lower lungs. Findings are suggestive for bilateral pneumonia.     Scheduled Meds: . amLODipine  2.5 mg Oral BID  . atorvastatin  20 mg Oral QHS  . enoxaparin (LOVENOX) injection  40 mg Subcutaneous Q24H  . nebivolol  10 mg Oral BID   Continuous Infusions: . sodium chloride 75 mL/hr at 08/21/16 2119     LOS: 1 day    Time spent: 25 minutes  Greater than 50% of the time spent on counseling and coordinating the care.   Leisa Lenz, MD [facility] Pager [phone]  If 7PM-7AM, please contact night-coverage [email or web address] Password [email or web address] 08/22/2016, 11:27 AM

## 2016-08-23 DIAGNOSIS — J1100 Influenza due to unidentified influenza virus with unspecified type of pneumonia: Secondary | ICD-10-CM

## 2016-08-23 LAB — LEGIONELLA PNEUMOPHILA SEROGP 1 UR AG: L. PNEUMOPHILA SEROGP 1 UR AG: NEGATIVE

## 2016-08-23 LAB — BASIC METABOLIC PANEL
Anion gap: 7 (ref 5–15)
BUN: 9 mg/dL (ref 6–20)
CALCIUM: 8.2 mg/dL — AB (ref 8.9–10.3)
CHLORIDE: 103 mmol/L (ref 101–111)
CO2: 28 mmol/L (ref 22–32)
CREATININE: 0.64 mg/dL (ref 0.61–1.24)
GFR calc Af Amer: >60 mL/min (ref >60)
GFR calc non Af Amer: >60 mL/min (ref >60)
Glucose, Bld: 106 mg/dL — ABNORMAL HIGH (ref 65–99)
Potassium: 3.6 mmol/L (ref 3.5–5.1)
SODIUM: 138 mmol/L (ref 135–145)

## 2016-08-23 LAB — CBC
HCT: 40.8 % (ref 39.0–52.0)
HEMOGLOBIN: 13.2 g/dL (ref 13.0–17.0)
MCH: 28.9 pg (ref 26.0–34.0)
MCHC: 32.4 g/dL (ref 30.0–36.0)
MCV: 89.5 fL (ref 78.0–100.0)
PLATELETS: 239 10*3/uL (ref 150–400)
RBC: 4.56 MIL/uL (ref 4.22–5.81)
RDW: 13.6 % (ref 11.5–15.5)
WBC: 13.8 10*3/uL — ABNORMAL HIGH (ref 4.0–10.5)

## 2016-08-23 LAB — PROCALCITONIN: PROCALCITONIN: 0.13 ng/mL

## 2016-08-23 NOTE — Care Management Note (Addendum)
Case Management Note  Patient Details  Name: ADA IRVIN MRN: [id number] Date of Birth: 1941/06/16  Subjective/Objective:  Pt presented for CAP. Pt is from home and plans to return home once stable. No DME- will monitor for home 02 needs.                   Action/Plan: CM will continue to monitor.   Expected Discharge Date:                  Expected Discharge Plan:  Home/Self Care  In-House Referral:  NA  Discharge planning Services  CM Consult  Post Acute Care Choice:    Choice offered to:     DME Arranged:    DME Agency:     HH Arranged:   N/A HH Agency:   N/A  Status of Service:  In process, will continue to follow  If discussed at Long Length of Stay Meetings, dates discussed:    Additional Comments: 1040 08-24-16 Jacqlyn Krauss, RN,BSN [phone]  CM did speak with MD plans to monitor due to worsening PNA and pt continues on 4-5 L O2. Continues on IV Rocephin and Zithromax. CM will continue to monitor for DME needs for home.  Bethena Roys, RN 08/23/2016, 2:57 PM

## 2016-08-23 NOTE — Progress Notes (Signed)
Patient ID: Ray Fowler, male   DOB: Mar 15, 1941, 76 y.o.   MRN: [id number]  PROGRESS NOTE    Ray Fowler  [id number] DOB: [birth year] DOA: 08/21/2016  PCP: Elsie Stain, MD   Brief Narrative:  76 y.o. male with history of pulmonary nodule, stable since 2011, recent flu like symptoms treated outpatient by his PCP with Tamiflu finishing the medication 1/9. Patient presented with worsening shortness of breath productive of green sputum, fevers, generalized weakness and myalgias. He was seen at [place] office and noted to have oxygen saturation of 87% on room air. He was brought to ER for further evaluation. On admission, patient was hemodynamically stable. Chest x-ray showed bilateral pneumonia. Influenza was negative.  Assessment & Plan:   Active Problems: Acute respiratory failure with hypoxia / left lower lobe pneumonia / leukocytosis - Hypoxia likely in the setting of pneumonia (bacterial and viral) - Chest x-ray on admission showed dense left lower lobe pneumonia and right mid and lower lung infiltrates concerning for interstitial pneumonia - Continue Azithromycin and Rocephin - Blood cx negative so far  - Respiratory virus panel positive for influenza but influenza panel negative  - Influenza is negative  Influenza pneumonia - Influenza panel negative but respiratory virus panel positive for influenza A - Pt has already finished appropriate course of tamiflu prior to admission  Essential hypertension - Continue Norvasc 2.5 mg twice daily and Bystolic 10 mg twice daily  Dyslipidemia - Continue Lipitor    DVT prophylaxis: Lovenox subQ  Code Status: full code  Family Communication: No family at the bedside Disposition Plan: Home likely by 08/24/2016   Consultants:   None   Procedures:   None   Antimicrobials:   Azithromycin and Rocephin given in ED 1/9; 1/10 -->     Subjective: Feels better this am.  Objective: Vitals:   08/23/16 0738 08/23/16  0800 08/23/16 0900 08/23/16 1000  BP:      Pulse:      Resp:      Temp: 98.3 F (36.8 C)     TempSrc: Oral     SpO2: 93% 90% 91% 94%  Weight:      Height:        Intake/Output Summary (Last 24 hours) at 08/23/16 1103 Last data filed at 08/23/16 0813  Gross per 24 hour  Intake             1925 ml  Output             1525 ml  Net              400 ml   Filed Weights   08/21/16 2001 08/23/16 0430  Weight: 73.3 kg (161 lb 11.2 oz) 74.2 kg (163 lb 9.3 oz)    Examination:  General exam: Appears calm and comfortable, no distress  Respiratory system: coarse sounds but no wheezing  Cardiovascular system: S1 & S2 heard, Rate controlled . Gastrointestinal system: (+) BS, non tender abdomen  Central nervous system: No focal neurological deficits. Extremities: Symmetric 5 x 5 power. No edema Skin: Skin is warm and dry  Psychiatry: Mood & affect appropriate.   Data Reviewed: I have personally reviewed following labs and imaging studies  CBC:  Recent Labs Lab 08/21/16 1326 08/22/16 0443 08/23/16 0417  WBC 14.9* 12.7* 13.8*  NEUTROABS 10.2*  --   --   HGB 15.0 13.4 13.2  HCT 45.2 41.3 40.8  MCV 89.7 91.0 89.5  PLT 197 197 239   Basic  Metabolic Panel:  Recent Labs Lab 08/21/16 1326 08/22/16 0443 08/23/16 0417  NA 142 140 138  K 4.0 3.8 3.6  CL 105 104 103  CO2 28 28 28   GLUCOSE 122* 108* 106*  BUN 24* 16 9  CREATININE 0.81 0.74 0.64  CALCIUM 9.0 8.3* 8.2*  MG 2.1  --   --    GFR: Estimated Creatinine Clearance: 83.6 mL/min (by C-G formula based on SCr of 0.64 mg/dL). Liver Function Tests:  Recent Labs Lab 08/22/16 0443  AST 51*  ALT 65*  ALKPHOS 66  BILITOT 0.8  PROT 6.1*  ALBUMIN 2.4*   No results for input(s): LIPASE, AMYLASE in the last 168 hours. No results for input(s): AMMONIA in the last 168 hours. Coagulation Profile: No results for input(s): INR, PROTIME in the last 168 hours. Cardiac Enzymes: No results for input(s): CKTOTAL, CKMB,  CKMBINDEX, TROPONINI in the last 168 hours. BNP (last 3 results) No results for input(s): PROBNP in the last 8760 hours. HbA1C: No results for input(s): HGBA1C in the last 72 hours. CBG: No results for input(s): GLUCAP in the last 168 hours. Lipid Profile: No results for input(s): CHOL, HDL, LDLCALC, TRIG, CHOLHDL, LDLDIRECT in the last 72 hours. Thyroid Function Tests: No results for input(s): TSH, T4TOTAL, FREET4, T3FREE, THYROIDAB in the last 72 hours. Anemia Panel: No results for input(s): VITAMINB12, FOLATE, FERRITIN, TIBC, IRON, RETICCTPCT in the last 72 hours. Urine analysis:    Component Value Date/Time   COLORURINE AMBER (A) 08/21/2016 1655   APPEARANCEUR HAZY (A) 08/21/2016 1655   LABSPEC 1.025 08/21/2016 1655   PHURINE 5.0 08/21/2016 1655   GLUCOSEU NEGATIVE 08/21/2016 1655   HGBUR NEGATIVE 08/21/2016 1655   HGBUR negative 02/18/2008 [place] 08/21/2016 1655   BILIRUBINUR neg 05/31/2015 1543   KETONESUR NEGATIVE 08/21/2016 1655   PROTEINUR 100 (A) 08/21/2016 1655   UROBILINOGEN negative 05/31/2015 1543   UROBILINOGEN 0.2 10/28/2011 0403   NITRITE NEGATIVE 08/21/2016 1655   LEUKOCYTESUR NEGATIVE 08/21/2016 1655   Sepsis Labs: @LABRCNTIP (procalcitonin:4,lacticidven:4)   Recent Results (from the past 240 hour(s))  Culture, blood (routine x 2) Call MD if unable to obtain prior to antibiotics being given     Status: None (Preliminary result)   Collection Time: 08/21/16  8:07 PM  Result Value Ref Range Status   Specimen Description BLOOD RIGHT ANTECUBITAL  Final   Special Requests BOTTLES DRAWN AEROBIC AND ANAEROBIC 6CC EACH  Final   Culture NO GROWTH < 24 HOURS  Final   Report Status PENDING  Incomplete  Culture, blood (routine x 2) Call MD if unable to obtain prior to antibiotics being given     Status: None (Preliminary result)   Collection Time: 08/21/16  8:17 PM  Result Value Ref Range Status   Specimen Description BLOOD RIGHT FOREARM  Final    Special Requests BOTTLES DRAWN AEROBIC AND ANAEROBIC 4CC EACH  Final   Culture NO GROWTH < 24 HOURS  Final   Report Status PENDING  Incomplete  Respiratory Panel by PCR     Status: Abnormal   Collection Time: 08/21/16  9:08 PM  Result Value Ref Range Status   Adenovirus NOT DETECTED NOT DETECTED Final   Coronavirus 229E NOT DETECTED NOT DETECTED Final   Coronavirus HKU1 NOT DETECTED NOT DETECTED Final   Coronavirus NL63 NOT DETECTED NOT DETECTED Final   Coronavirus OC43 NOT DETECTED NOT DETECTED Final   Metapneumovirus NOT DETECTED NOT DETECTED Final   Rhinovirus / Enterovirus NOT DETECTED  NOT DETECTED Final   Influenza A H3 DETECTED (A) NOT DETECTED Final   Influenza B NOT DETECTED NOT DETECTED Final   Parainfluenza Virus 1 NOT DETECTED NOT DETECTED Final   Parainfluenza Virus 2 NOT DETECTED NOT DETECTED Final   Parainfluenza Virus 3 NOT DETECTED NOT DETECTED Final   Parainfluenza Virus 4 NOT DETECTED NOT DETECTED Final   Respiratory Syncytial Virus NOT DETECTED NOT DETECTED Final   Bordetella pertussis NOT DETECTED NOT DETECTED Final   Chlamydophila pneumoniae NOT DETECTED NOT DETECTED Final   Mycoplasma pneumoniae NOT DETECTED NOT DETECTED Final      Radiology Studies: Dg Chest 2 View Result Date: 08/22/2016 Dense left lower lobe pneumonia, stable. Patchy interstitial densities in the right mid and lower lung worrisome for early interstitial pneumonia. No CHF. Previously placed aortic stent graft throughout the entire course of the thoracic aorta.    Dg Chest 2 View Result Date: 08/21/2016 Patchy densities in both lower lungs. Findings are suggestive for bilateral pneumonia.     Scheduled Meds: . amLODipine  2.5 mg Oral BID  . atorvastatin  20 mg Oral QHS  . azithromycin (ZITHROMAX) 500 MG IVPB  500 mg Intravenous Q24H  . cefTRIAXone (ROCEPHIN)  IV  1 g Intravenous Q24H  . enoxaparin (LOVENOX) injection  40 mg Subcutaneous Q24H  . nebivolol  10 mg Oral BID    Continuous Infusions: . sodium chloride 50 mL/hr (08/23/16 0816)     LOS: 2 days    Time spent: 25 minutes  Greater than 50% of the time spent on counseling and coordinating the care.   Leisa Lenz, MD [facility] Pager [phone]  If 7PM-7AM, please contact night-coverage [email or web address] Password [email or web address] 08/23/2016, 11:03 AM

## 2016-08-24 LAB — BASIC METABOLIC PANEL
Anion gap: 9 (ref 5–15)
BUN: 7 mg/dL (ref 6–20)
CO2: 28 mmol/L (ref 22–32)
Calcium: 8.7 mg/dL — ABNORMAL LOW (ref 8.9–10.3)
Chloride: 104 mmol/L (ref 101–111)
Creatinine, Ser: 0.71 mg/dL (ref 0.61–1.24)
GFR calc Af Amer: >60 mL/min (ref >60)
GFR calc non Af Amer: >60 mL/min (ref >60)
Glucose, Bld: 97 mg/dL (ref 65–99)
Potassium: 3.9 mmol/L (ref 3.5–5.1)
Sodium: 141 mmol/L (ref 135–145)

## 2016-08-24 LAB — CBC
HCT: 43.4 % (ref 39.0–52.0)
Hemoglobin: 14.1 g/dL (ref 13.0–17.0)
MCH: 29.2 pg (ref 26.0–34.0)
MCHC: 32.5 g/dL (ref 30.0–36.0)
MCV: 89.9 fL (ref 78.0–100.0)
Platelets: 260 10*3/uL (ref 150–400)
RBC: 4.83 MIL/uL (ref 4.22–5.81)
RDW: 13.5 % (ref 11.5–15.5)
WBC: 14 10*3/uL — ABNORMAL HIGH (ref 4.0–10.5)

## 2016-08-24 MED ORDER — AZITHROMYCIN 500 MG PO TABS
500.0000 mg | ORAL_TABLET | Freq: Every day | ORAL | Status: DC
  Administered 2016-08-24 – 2016-08-26 (×3): 500 mg via ORAL
  Filled 2016-08-24 (×3): qty 1

## 2016-08-24 NOTE — Progress Notes (Signed)
SATURATION QUALIFICATIONS: (This note is used to comply with regulatory documentation for home oxygen)  Patient Saturations on Room Air at Rest = 87%  Patient Saturations on Room Air while Ambulating = 86%  Patient Saturations on 4 Liters of oxygen while Ambulating = 93%  Please briefly explain why patient needs home oxygen:O2 sat drop with activity  Branna Cortina, Tivis Ringer, RN

## 2016-08-24 NOTE — Progress Notes (Signed)
Patient being transferred to [id number] report called to [facility]  Sukhraj Esquivias, Tivis Ringer, RN

## 2016-08-24 NOTE — Progress Notes (Signed)
PROGRESS NOTE    Ray Fowler  [id number] DOB: 1940-08-16 DOA: 08/21/2016 PCP: Elsie Stain, MD   Brief Narrative: 76 y.o.malewith history of pulmonary nodule, stable since 2011, recent flu like symptoms treated outpatient by his PCP with Tamiflu finishing the medication 1/9. Patient presented with worsening shortness of breath productive of green sputum, fevers, generalized weakness and myalgias. He was seen at [place] office and noted to have oxygen saturation of 87% on room air. He was brought to ER for further evaluation.On admission, patient was hemodynamically stable. Chest x-ray showed bilateral pneumonia.   Assessment & Plan:   Active Problems:   HYPERTRIGLYCERIDEMIA   Essential hypertension   CAD (coronary artery disease)   Aortic valve disorder   Secondary cardiomyopathy ([place])   Dissection of aorta ([facility])   Cough   Hypoxia   CAP (community acquired pneumonia)  # Left lower lobe pneumonia: -Respiratory viral panel positive for influenza A. Patient recently completed a course of Tamiflu treatment. Continue treatment with IV ceftriaxone and azithromycin. Cultures negative so far. May be able to switch to Levaquin on discharge. -Monitor leukocytosis, WBC 14.  #Acute hypoxic respiratory failure: The patient is requiring about 5 L of oxygen today. He was ambulating in the hallway with desaturations. He is still dyspneic. We will continue DuoNeb. Nebulization, Mucinex. -Order incentive spirometer -Plan to wean down oxygen gradually. If unable to wean, patient may need to go home with oxygen and follow up with PCP.  #Hypertension: Continue Norvasc, Bystolic. Monitor blood pressure.  DVT prophylaxis: Lovenox Code Status: Full code Family Communication: No family present at bedside Disposition Plan: Likely discharge home in 1-2 days    Consultants:   None  Procedures: None Antimicrobials: Ceftriaxone and azithromycin  Subjective: Recent was seen and examined  at bedside. Patient is requiring 5 L of oxygen. He reported that his shortness of breath is better than on admission but still feels short of breath. Feels weak. Denied headache, dizziness, nausea vomiting.   Objective: Vitals:   08/24/16 0700 08/24/16 0800 08/24/16 0900 08/24/16 1100  BP:      Pulse:      Resp:      Temp:      TempSrc:      SpO2: 93% 93% 91% 93%  Weight:      Height:        Intake/Output Summary (Last 24 hours) at 08/24/16 1302 Last data filed at 08/24/16 1104  Gross per 24 hour  Intake          1593.33 ml  Output             1875 ml  Net          -281.67 ml   Filed Weights   08/21/16 2001 08/23/16 0430 08/24/16 0526  Weight: 73.3 kg (161 lb 11.2 oz) 74.2 kg (163 lb 9.3 oz) 73.1 kg (161 lb 3.2 oz)    Examination:  General exam: Appears calm and comfortable  Respiratory system: Bibasal crackles, more on left side, respiratory effort normal, no wheezing Cardiovascular system: S1 & S2 heard, RRR.  No pedal edema. Gastrointestinal system: Abdomen is nondistended, soft and nontender. Normal bowel sounds heard. Central nervous system: Alert and oriented. No focal neurological deficits. Extremities: Symmetric 5 x 5 power. Skin: No rashes, lesions or ulcers Psychiatry: Judgement and insight appear normal. Mood & affect appropriate.     Data Reviewed: I have personally reviewed following labs and imaging studies  CBC:  Recent Labs Lab 08/21/16 1326 08/22/16  [id number] 08/23/16 0417 08/24/16 0527  WBC 14.9* 12.7* 13.8* 14.0*  NEUTROABS 10.2*  --   --   --   HGB 15.0 13.4 13.2 14.1  HCT 45.2 41.3 40.8 43.4  MCV 89.7 91.0 89.5 89.9  PLT 197 197 239 [id number]   Basic Metabolic Panel:  Recent Labs Lab 08/21/16 1326 08/22/16 0443 08/23/16 0417 08/24/16 0527  NA 142 140 138 141  K 4.0 3.8 3.6 3.9  CL 105 104 103 104  CO2 28 28 28 28   GLUCOSE 122* 108* 106* 97  BUN 24* 16 9 7   CREATININE 0.81 0.74 0.64 0.71  CALCIUM 9.0 8.3* 8.2* 8.7*  MG 2.1  --   --    --    GFR: Estimated Creatinine Clearance: 83.6 mL/min (by C-G formula based on SCr of 0.71 mg/dL). Liver Function Tests:  Recent Labs Lab 08/22/16 0443  AST 51*  ALT 65*  ALKPHOS 66  BILITOT 0.8  PROT 6.1*  ALBUMIN 2.4*   No results for input(s): LIPASE, AMYLASE in the last 168 hours. No results for input(s): AMMONIA in the last 168 hours. Coagulation Profile: No results for input(s): INR, PROTIME in the last 168 hours. Cardiac Enzymes: No results for input(s): CKTOTAL, CKMB, CKMBINDEX, TROPONINI in the last 168 hours. BNP (last 3 results) No results for input(s): PROBNP in the last 8760 hours. HbA1C: No results for input(s): HGBA1C in the last 72 hours. CBG: No results for input(s): GLUCAP in the last 168 hours. Lipid Profile: No results for input(s): CHOL, HDL, LDLCALC, TRIG, CHOLHDL, LDLDIRECT in the last 72 hours. Thyroid Function Tests: No results for input(s): TSH, T4TOTAL, FREET4, T3FREE, THYROIDAB in the last 72 hours. Anemia Panel: No results for input(s): VITAMINB12, FOLATE, FERRITIN, TIBC, IRON, RETICCTPCT in the last 72 hours. Sepsis Labs:  Recent Labs Lab 08/21/16 2016 08/23/16 0417  PROCALCITON <0.10 0.13    Recent Results (from the past 240 hour(s))  Culture, blood (routine x 2) Call MD if unable to obtain prior to antibiotics being given     Status: None (Preliminary result)   Collection Time: 08/21/16  8:07 PM  Result Value Ref Range Status   Specimen Description BLOOD RIGHT ANTECUBITAL  Final   Special Requests BOTTLES DRAWN AEROBIC AND ANAEROBIC [facility] EACH  Final   Culture NO GROWTH 2 DAYS  Final   Report Status PENDING  Incomplete  Culture, blood (routine x 2) Call MD if unable to obtain prior to antibiotics being given     Status: None (Preliminary result)   Collection Time: 08/21/16  8:17 PM  Result Value Ref Range Status   Specimen Description BLOOD RIGHT FOREARM  Final   Special Requests BOTTLES DRAWN AEROBIC AND ANAEROBIC 4CC EACH  Final     Culture NO GROWTH 2 DAYS  Final   Report Status PENDING  Incomplete  Respiratory Panel by PCR     Status: Abnormal   Collection Time: 08/21/16  9:08 PM  Result Value Ref Range Status   Adenovirus NOT DETECTED NOT DETECTED Final   Coronavirus 229E NOT DETECTED NOT DETECTED Final   Coronavirus HKU1 NOT DETECTED NOT DETECTED Final   Coronavirus NL63 NOT DETECTED NOT DETECTED Final   Coronavirus OC43 NOT DETECTED NOT DETECTED Final   Metapneumovirus NOT DETECTED NOT DETECTED Final   Rhinovirus / Enterovirus NOT DETECTED NOT DETECTED Final   Influenza A H3 DETECTED (A) NOT DETECTED Final   Influenza B NOT DETECTED NOT DETECTED Final   Parainfluenza Virus 1 NOT DETECTED  NOT DETECTED Final   Parainfluenza Virus 2 NOT DETECTED NOT DETECTED Final   Parainfluenza Virus 3 NOT DETECTED NOT DETECTED Final   Parainfluenza Virus 4 NOT DETECTED NOT DETECTED Final   Respiratory Syncytial Virus NOT DETECTED NOT DETECTED Final   Bordetella pertussis NOT DETECTED NOT DETECTED Final   Chlamydophila pneumoniae NOT DETECTED NOT DETECTED Final   Mycoplasma pneumoniae NOT DETECTED NOT DETECTED Final         Radiology Studies: No results found.      Scheduled Meds: . amLODipine  2.5 mg Oral BID  . atorvastatin  20 mg Oral QHS  . azithromycin (ZITHROMAX) 500 MG IVPB  500 mg Intravenous Q24H  . cefTRIAXone (ROCEPHIN)  IV  1 g Intravenous Q24H  . enoxaparin (LOVENOX) injection  40 mg Subcutaneous Q24H  . nebivolol  10 mg Oral BID   Continuous Infusions: . sodium chloride 50 mL/hr at 08/24/16 0534     LOS: 3 days    Dron Tanna Furry, MD [facility] Pager [phone]  If 7PM-7AM, please contact night-coverage [email or web address] Password [email or web address] 08/24/2016, 1:02 PM

## 2016-08-24 NOTE — Progress Notes (Signed)
Pt's O2 sats have dropped to the 80s several times throughout the night. Pt was on 4L O2 via [place] & it was increased to 5L will continue to monitor the pt. Hoover Brunette, RN

## 2016-08-25 ENCOUNTER — Encounter (HOSPITAL_COMMUNITY): Payer: Self-pay | Admitting: *Deleted

## 2016-08-25 DIAGNOSIS — J189 Pneumonia, unspecified organism: Secondary | ICD-10-CM

## 2016-08-25 DIAGNOSIS — J9601 Acute respiratory failure with hypoxia: Secondary | ICD-10-CM

## 2016-08-25 LAB — PROCALCITONIN: Procalcitonin: <0.1 ng/mL

## 2016-08-25 MED ORDER — GUAIFENESIN ER 600 MG PO TB12
600.0000 mg | ORAL_TABLET | Freq: Two times a day (BID) | ORAL | Status: DC
  Administered 2016-08-25 – 2016-08-27 (×4): 600 mg via ORAL
  Filled 2016-08-25 (×4): qty 1

## 2016-08-25 NOTE — Progress Notes (Signed)
PROGRESS NOTE    Ray Fowler  [id number] DOB: August 09, 1941 DOA: 08/21/2016 PCP: Elsie Stain, MD   Brief Narrative: 76 y.o.malewith history of pulmonary nodule, stable since 2011, recent flu like symptoms treated outpatient by his PCP with Tamiflu finishing the medication 1/9. Patient presented with worsening shortness of breath productive of green sputum, fevers, generalized weakness and myalgias. He was seen at [place] office and noted to have oxygen saturation of 87% on room air. He was brought to ER for further evaluation.On admission, patient was hemodynamically stable. Chest x-ray showed bilateral pneumonia.   Assessment & Plan:   Active Problems:   HYPERTRIGLYCERIDEMIA   Essential hypertension   CAD (coronary artery disease)   Aortic valve disorder   Secondary cardiomyopathy ([facility])   Dissection of aorta ([facility])   Cough   Hypoxia   Community acquired pneumonia  # Left lower lobe pneumonia: -Respiratory viral panel positive for influenza A. Patient recently completed a course of Tamiflu treatment. Continue treatment with IV ceftriaxone and azithromycin. Blood Cultures negative, sputum was not collected,mrsa screening pending  May be able to switch to Levaquin on discharge. -Monitor leukocytosis, WBC 14. If persistent leukocytosis, may consider Ct chest   #Acute hypoxic respiratory failure:  requiring 5 L of oxygen initially.  - continue DuoNeb. Nebulization, Mucinex. - incentive spirometer -Plan to wean down oxygen gradually. If unable to wean, consider ct chest .  #Hypertension: Continue Norvasc, Bystolic. Monitor blood pressure.  H/o aortic dissection in 2010 with surgical repair. Followed by cardiology and ct surgery regularly  DVT prophylaxis: Lovenox Code Status: Full code Family Communication: No family present at bedside Disposition Plan: wean o2, monitor wbc , repeat ct chest is no improvement    Consultants:   None  Procedures:  None Antimicrobials: Ceftriaxone and azithromycin  Subjective:  Recent was seen and examined at bedside. He is feeling better, but still oxygen dependent, less cough, no fever, no chest pain, no edema He is overall frustrated about the slow improvement   Objective: Vitals:   08/25/16 0851 08/25/16 1301 08/25/16 1326 08/25/16 1531  BP: (!) 163/64  (!) 147/76   Pulse:   80   Resp:   16   Temp:   97.7 F (36.5 C)   TempSrc:   Oral   SpO2:  93% 94% 90%  Weight:      Height:        Intake/Output Summary (Last 24 hours) at 08/25/16 1731 Last data filed at 08/25/16 1330  Gross per 24 hour  Intake          1096.66 ml  Output             2600 ml  Net         -1503.34 ml   Filed Weights   08/23/16 0430 08/24/16 0526 08/24/16 1834  Weight: 74.2 kg (163 lb 9.3 oz) 73.1 kg (161 lb 3.2 oz) 73 kg (161 lb)    Examination:  General exam: Appears calm and comfortable  Respiratory system: Bibasal crackles, more on left side, respiratory effort normal, no wheezing Cardiovascular system: S1 & S2 heard, RRR.  No pedal edema. Gastrointestinal system: Abdomen is nondistended, soft and nontender. Normal bowel sounds heard. Central nervous system: Alert and oriented. No focal neurological deficits. Extremities: Symmetric 5 x 5 power. Skin: No rashes, lesions or ulcers Psychiatry: Judgement and insight appear normal. Mood & affect appropriate.     Data Reviewed: I have personally reviewed following labs and imaging studies  CBC:  Recent Labs Lab 08/21/16 1326 08/22/16 0443 08/23/16 0417 08/24/16 0527  WBC 14.9* 12.7* 13.8* 14.0*  NEUTROABS 10.2*  --   --   --   HGB 15.0 13.4 13.2 14.1  HCT 45.2 41.3 40.8 43.4  MCV 89.7 91.0 89.5 89.9  PLT 197 197 239 [id number]   Basic Metabolic Panel:  Recent Labs Lab 08/21/16 1326 08/22/16 0443 08/23/16 0417 08/24/16 0527  NA 142 140 138 141  K 4.0 3.8 3.6 3.9  CL 105 104 103 104  CO2 28 28 28 28   GLUCOSE 122* 108* 106* 97  BUN 24* 16 9  7   CREATININE 0.81 0.74 0.64 0.71  CALCIUM 9.0 8.3* 8.2* 8.7*  MG 2.1  --   --   --    GFR: Estimated Creatinine Clearance: 83.6 mL/min (by C-G formula based on SCr of 0.71 mg/dL). Liver Function Tests:  Recent Labs Lab 08/22/16 0443  AST 51*  ALT 65*  ALKPHOS 66  BILITOT 0.8  PROT 6.1*  ALBUMIN 2.4*   No results for input(s): LIPASE, AMYLASE in the last 168 hours. No results for input(s): AMMONIA in the last 168 hours. Coagulation Profile: No results for input(s): INR, PROTIME in the last 168 hours. Cardiac Enzymes: No results for input(s): CKTOTAL, CKMB, CKMBINDEX, TROPONINI in the last 168 hours. BNP (last 3 results) No results for input(s): PROBNP in the last 8760 hours. HbA1C: No results for input(s): HGBA1C in the last 72 hours. CBG: No results for input(s): GLUCAP in the last 168 hours. Lipid Profile: No results for input(s): CHOL, HDL, LDLCALC, TRIG, CHOLHDL, LDLDIRECT in the last 72 hours. Thyroid Function Tests: No results for input(s): TSH, T4TOTAL, FREET4, T3FREE, THYROIDAB in the last 72 hours. Anemia Panel: No results for input(s): VITAMINB12, FOLATE, FERRITIN, TIBC, IRON, RETICCTPCT in the last 72 hours. Sepsis Labs:  Recent Labs Lab 08/21/16 2016 08/23/16 0417 08/25/16 0704  PROCALCITON <0.10 0.13 <0.10    Recent Results (from the past 240 hour(s))  Culture, blood (routine x 2) Call MD if unable to obtain prior to antibiotics being given     Status: None (Preliminary result)   Collection Time: 08/21/16  8:07 PM  Result Value Ref Range Status   Specimen Description BLOOD RIGHT ANTECUBITAL  Final   Special Requests BOTTLES DRAWN AEROBIC AND ANAEROBIC [facility] EACH  Final   Culture NO GROWTH 4 DAYS  Final   Report Status PENDING  Incomplete  Culture, blood (routine x 2) Call MD if unable to obtain prior to antibiotics being given     Status: None (Preliminary result)   Collection Time: 08/21/16  8:17 PM  Result Value Ref Range Status   Specimen  Description BLOOD RIGHT FOREARM  Final   Special Requests BOTTLES DRAWN AEROBIC AND ANAEROBIC 4CC EACH  Final   Culture NO GROWTH 4 DAYS  Final   Report Status PENDING  Incomplete  Respiratory Panel by PCR     Status: Abnormal   Collection Time: 08/21/16  9:08 PM  Result Value Ref Range Status   Adenovirus NOT DETECTED NOT DETECTED Final   Coronavirus 229E NOT DETECTED NOT DETECTED Final   Coronavirus HKU1 NOT DETECTED NOT DETECTED Final   Coronavirus NL63 NOT DETECTED NOT DETECTED Final   Coronavirus OC43 NOT DETECTED NOT DETECTED Final   Metapneumovirus NOT DETECTED NOT DETECTED Final   Rhinovirus / Enterovirus NOT DETECTED NOT DETECTED Final   Influenza A H3 DETECTED (A) NOT DETECTED Final   Influenza B NOT DETECTED NOT DETECTED  Final   Parainfluenza Virus 1 NOT DETECTED NOT DETECTED Final   Parainfluenza Virus 2 NOT DETECTED NOT DETECTED Final   Parainfluenza Virus 3 NOT DETECTED NOT DETECTED Final   Parainfluenza Virus 4 NOT DETECTED NOT DETECTED Final   Respiratory Syncytial Virus NOT DETECTED NOT DETECTED Final   Bordetella pertussis NOT DETECTED NOT DETECTED Final   Chlamydophila pneumoniae NOT DETECTED NOT DETECTED Final   Mycoplasma pneumoniae NOT DETECTED NOT DETECTED Final         Radiology Studies: No results found.      Scheduled Meds: . amLODipine  2.5 mg Oral BID  . atorvastatin  20 mg Oral QHS  . azithromycin  500 mg Oral Daily  . cefTRIAXone (ROCEPHIN)  IV  1 g Intravenous Q24H  . enoxaparin (LOVENOX) injection  40 mg Subcutaneous Q24H  . nebivolol  10 mg Oral BID   Continuous Infusions: . sodium chloride 50 mL/hr (08/24/16 2233)     LOS: 4 days    Nadir Vasques, MD PhD [facility] Pager [phone]  If 7PM-7AM, please contact night-coverage [email or web address] Password [facility] 08/25/2016, 5:31 PM

## 2016-08-25 NOTE — Progress Notes (Signed)
SATURATION QUALIFICATIONS: (This note is used to comply with regulatory documentation for home oxygen)  Patient Saturations on Room Air at Rest = 88%  Patient Saturations on Room Air while Ambulating = 88%  Patient Saturations on 1 Liters of oxygen while Ambulating = 91%  Please briefly explain why patient needs home oxygen:

## 2016-08-26 ENCOUNTER — Inpatient Hospital Stay (HOSPITAL_COMMUNITY): Payer: Medicare Other

## 2016-08-26 DIAGNOSIS — J181 Lobar pneumonia, unspecified organism: Secondary | ICD-10-CM

## 2016-08-26 LAB — CULTURE, BLOOD (ROUTINE X 2)
Culture: NO GROWTH
Culture: NO GROWTH

## 2016-08-26 LAB — COMPREHENSIVE METABOLIC PANEL
ALT: 65 U/L — ABNORMAL HIGH (ref 17–63)
AST: 39 U/L (ref 15–41)
Albumin: 2.3 g/dL — ABNORMAL LOW (ref 3.5–5.0)
Alkaline Phosphatase: 55 U/L (ref 38–126)
Anion gap: 9 (ref 5–15)
BILIRUBIN TOTAL: 0.9 mg/dL (ref 0.3–1.2)
BUN: 13 mg/dL (ref 6–20)
CHLORIDE: 104 mmol/L (ref 101–111)
CO2: 26 mmol/L (ref 22–32)
Calcium: 8.4 mg/dL — ABNORMAL LOW (ref 8.9–10.3)
Creatinine, Ser: 0.66 mg/dL (ref 0.61–1.24)
Glucose, Bld: 87 mg/dL (ref 65–99)
POTASSIUM: 4 mmol/L (ref 3.5–5.1)
Sodium: 139 mmol/L (ref 135–145)
TOTAL PROTEIN: 6.6 g/dL (ref 6.5–8.1)

## 2016-08-26 LAB — CBC WITH DIFFERENTIAL/PLATELET
BASOS PCT: 0 %
Basophils Absolute: 0 10*3/uL (ref 0.0–0.1)
EOS PCT: 1 %
Eosinophils Absolute: 0.1 10*3/uL (ref 0.0–0.7)
HEMATOCRIT: 39.5 % (ref 39.0–52.0)
Hemoglobin: 12.7 g/dL — ABNORMAL LOW (ref 13.0–17.0)
LYMPHS ABS: 2.1 10*3/uL (ref 0.7–4.0)
Lymphocytes Relative: 19 %
MCH: 29.3 pg (ref 26.0–34.0)
MCHC: 32.2 g/dL (ref 30.0–36.0)
MCV: 91.2 fL (ref 78.0–100.0)
MONO ABS: 0.9 10*3/uL (ref 0.1–1.0)
MONOS PCT: 8 %
NEUTROS ABS: 8 10*3/uL — AB (ref 1.7–7.7)
Neutrophils Relative %: 72 %
PLATELETS: 231 10*3/uL (ref 150–400)
RBC: 4.33 MIL/uL (ref 4.22–5.81)
RDW: 13.8 % (ref 11.5–15.5)
WBC: 11.1 10*3/uL — AB (ref 4.0–10.5)

## 2016-08-26 LAB — MRSA PCR SCREENING: MRSA BY PCR: NEGATIVE

## 2016-08-26 LAB — MAGNESIUM: MAGNESIUM: 1.9 mg/dL (ref 1.7–2.4)

## 2016-08-26 NOTE — Progress Notes (Signed)
PROGRESS NOTE    Ray Fowler  [id number] DOB: September 07, 1940 DOA: 08/21/2016 PCP: Elsie Stain, MD   Brief Narrative: 76 y.o.malewith history of pulmonary nodule, stable since 2011, recent flu like symptoms treated outpatient by his PCP with Tamiflu finishing the medication 1/9. Patient presented with worsening shortness of breath productive of green sputum, fevers, generalized weakness and myalgias. He was seen at [place] office and noted to have oxygen saturation of 87% on room air. He was brought to ER for further evaluation.On admission, patient was hemodynamically stable. Chest x-ray showed bilateral pneumonia.   Assessment & Plan:   Active Problems:   HYPERTRIGLYCERIDEMIA   Essential hypertension   CAD (coronary artery disease)   Aortic valve disorder   Secondary cardiomyopathy (HCC)   Dissection of aorta (HCC)   Cough   Hypoxia   Community acquired pneumonia  # Left lower lobe pneumonia: -Respiratory viral panel positive for influenza A. Patient recently completed a course of Tamiflu treatment. Continue treatment with IV ceftriaxone and azithromycin. Blood Cultures negative, sputum was not collected,mrsa screening pending  - clinically improving, he feels better, less cough, but remain hypoxic, Ct chest on 1/14  #Acute hypoxic respiratory failure:  requiring 5 L of oxygen initially.  - continue DuoNeb. Nebulization, Mucinex. - incentive spirometer -remain hypoxic, ct chest on 1/14  #Hypertension: Continue Norvasc, Bystolic. Monitor blood pressure.  H/o aortic dissection in 2010 with surgical repair. Followed by cardiology and ct surgery regularly  DVT prophylaxis: Lovenox Code Status: Full code Family Communication: No family present at bedside Disposition Plan: wean o2, monitor wbc ,     Consultants:   None  Procedures: None Antimicrobials: Ceftriaxone and azithromycin  Subjective:  Recent was seen and examined at bedside. He is feeling better, but  still oxygen dependent, less cough, no fever, no chest pain, no edema He is overall frustrated about the slow improvement   Objective: Vitals:   08/25/16 2103 08/26/16 0520 08/26/16 0836 08/26/16 0857  BP: (!) 162/68 (!) 152/66 (!) 141/77   Pulse: 67 65    Resp: 17 17    Temp: 98 F (36.7 C) 98.1 F (36.7 C)    TempSrc: Oral Oral    SpO2: 92% 90%  90%  Weight:      Height:        Intake/Output Summary (Last 24 hours) at 08/26/16 1729 Last data filed at 08/26/16 1114  Gross per 24 hour  Intake              200 ml  Output             2050 ml  Net            -1850 ml   Filed Weights   08/23/16 0430 08/24/16 0526 08/24/16 1834  Weight: 74.2 kg (163 lb 9.3 oz) 73.1 kg (161 lb 3.2 oz) 73 kg (161 lb)    Examination:  General exam: Appears calm and comfortable  Respiratory system: Bibasal crackles, more on left side, respiratory effort normal, no wheezing Cardiovascular system: S1 & S2 heard, RRR.  No pedal edema. Gastrointestinal system: Abdomen is nondistended, soft and nontender. Normal bowel sounds heard. Central nervous system: Alert and oriented. No focal neurological deficits. Extremities: Symmetric 5 x 5 power. Skin: No rashes, lesions or ulcers Psychiatry: Judgement and insight appear normal. Mood & affect appropriate.     Data Reviewed: I have personally reviewed following labs and imaging studies  CBC:  Recent Labs Lab 08/21/16 1326 08/22/16 0443 08/23/16  [id number] 08/24/16 0527 08/26/16 0652  WBC 14.9* 12.7* 13.8* 14.0* 11.1*  NEUTROABS 10.2*  --   --   --  8.0*  HGB 15.0 13.4 13.2 14.1 12.7*  HCT 45.2 41.3 40.8 43.4 39.5  MCV 89.7 91.0 89.5 89.9 91.2  PLT 197 197 239 260 [id number]   Basic Metabolic Panel:  Recent Labs Lab 08/21/16 1326 08/22/16 0443 08/23/16 0417 08/24/16 0527 08/26/16 0652  NA 142 140 138 141 139  K 4.0 3.8 3.6 3.9 4.0  CL 105 104 103 104 104  CO2 28 28 28 28 26   GLUCOSE 122* 108* 106* 97 87  BUN 24* 16 9 7 13   CREATININE 0.81  0.74 0.64 0.71 0.66  CALCIUM 9.0 8.3* 8.2* 8.7* 8.4*  MG 2.1  --   --   --  1.9   GFR: Estimated Creatinine Clearance: 83.6 mL/min (by C-G formula based on SCr of 0.66 mg/dL). Liver Function Tests:  Recent Labs Lab 08/22/16 0443 08/26/16 0652  AST 51* 39  ALT 65* 65*  ALKPHOS 66 55  BILITOT 0.8 0.9  PROT 6.1* 6.6  ALBUMIN 2.4* 2.3*   No results for input(s): LIPASE, AMYLASE in the last 168 hours. No results for input(s): AMMONIA in the last 168 hours. Coagulation Profile: No results for input(s): INR, PROTIME in the last 168 hours. Cardiac Enzymes: No results for input(s): CKTOTAL, CKMB, CKMBINDEX, TROPONINI in the last 168 hours. BNP (last 3 results) No results for input(s): PROBNP in the last 8760 hours. HbA1C: No results for input(s): HGBA1C in the last 72 hours. CBG: No results for input(s): GLUCAP in the last 168 hours. Lipid Profile: No results for input(s): CHOL, HDL, LDLCALC, TRIG, CHOLHDL, LDLDIRECT in the last 72 hours. Thyroid Function Tests: No results for input(s): TSH, T4TOTAL, FREET4, T3FREE, THYROIDAB in the last 72 hours. Anemia Panel: No results for input(s): VITAMINB12, FOLATE, FERRITIN, TIBC, IRON, RETICCTPCT in the last 72 hours. Sepsis Labs:  Recent Labs Lab 08/21/16 2016 08/23/16 0417 08/25/16 0704  PROCALCITON <0.10 0.13 <0.10    Recent Results (from the past 240 hour(s))  Culture, blood (routine x 2) Call MD if unable to obtain prior to antibiotics being given     Status: None   Collection Time: 08/21/16  8:07 PM  Result Value Ref Range Status   Specimen Description BLOOD RIGHT ANTECUBITAL  Final   Special Requests BOTTLES DRAWN AEROBIC AND ANAEROBIC [place]  Final   Culture NO GROWTH 5 DAYS  Final   Report Status 08/26/2016 FINAL  Final  Culture, blood (routine x 2) Call MD if unable to obtain prior to antibiotics being given     Status: None   Collection Time: 08/21/16  8:17 PM  Result Value Ref Range Status   Specimen Description  BLOOD RIGHT FOREARM  Final   Special Requests BOTTLES DRAWN AEROBIC AND ANAEROBIC 4CC EACH  Final   Culture NO GROWTH 5 DAYS  Final   Report Status 08/26/2016 FINAL  Final  Respiratory Panel by PCR     Status: Abnormal   Collection Time: 08/21/16  9:08 PM  Result Value Ref Range Status   Adenovirus NOT DETECTED NOT DETECTED Final   Coronavirus 229E NOT DETECTED NOT DETECTED Final   Coronavirus HKU1 NOT DETECTED NOT DETECTED Final   Coronavirus NL63 NOT DETECTED NOT DETECTED Final   Coronavirus OC43 NOT DETECTED NOT DETECTED Final   Metapneumovirus NOT DETECTED NOT DETECTED Final   Rhinovirus / Enterovirus NOT DETECTED NOT DETECTED Final  Influenza A H3 DETECTED (A) NOT DETECTED Final   Influenza B NOT DETECTED NOT DETECTED Final   Parainfluenza Virus 1 NOT DETECTED NOT DETECTED Final   Parainfluenza Virus 2 NOT DETECTED NOT DETECTED Final   Parainfluenza Virus 3 NOT DETECTED NOT DETECTED Final   Parainfluenza Virus 4 NOT DETECTED NOT DETECTED Final   Respiratory Syncytial Virus NOT DETECTED NOT DETECTED Final   Bordetella pertussis NOT DETECTED NOT DETECTED Final   Chlamydophila pneumoniae NOT DETECTED NOT DETECTED Final   Mycoplasma pneumoniae NOT DETECTED NOT DETECTED Final         Radiology Studies: Ct Chest Wo Contrast  Result Date: 08/26/2016 CLINICAL DATA:  Followup of pneumonia. 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 EXAM: CT CHEST WITHOUT CONTRAST TECHNIQUE: Multidetector CT imaging of the chest was performed following the standard protocol without IV contrast. COMPARISON:  Plain films of 08/22/2016. Most recent CT of 09/11/2011. FINDINGS: Cardiovascular: Status post endograft repair of the entire thoracic aorta. No acute complication identified. Prior median sternotomy. Native coronary artery atherosclerosis. Bypassed graft supplying the great vessels, originating from the proximal ascending aorta just below the graft. Example image 68/ series 201.  Mediastinum/Nodes: Surgical clips in the right axilla. AP window nodes measure up to the 14 mm on image 43/series 201. Subcarinal node measures 1.3 cm on image 60/ series 201. Hilar regions poorly evaluated without intravenous contrast. Lungs/Pleura: Trace left-sided pleural fluid or thickening. Bronchial wall thickening is most apparent in the right lower lobe. There is debris or secretions in the right lower lobe endobronchial tree, including on image 92/ series 205. Azygos fissure. Mild centrilobular emphysema. Multifocal bilateral patchy airspace and nodular opacities with a slight peribronchovascular distribution. Patchy areas of consolidation in the lower lobes. Upper Abdomen: Normal imaged portions of the liver, spleen, stomach, pancreas, gallbladder, biliary tract, adrenal glands. Fluid density right renal lesions are most consistent with cysts. Nonobstructive 4 mm right renal collecting system calculus. An interpolar right renal lesion measures 1.9 cm and 63 HU. This was similar in size on 07/21/2015 and was consistent with a complex cyst on that study. Musculoskeletal: No acute osseous abnormality. IMPRESSION: 1. Multifocal airspace and ground-glass opacities are most consistent with infection, including atypical etiologies. 2. Thoracic adenopathy is mild and may be reactive. Follow-up chest CT at 3-6 months could confirm stability or resolution. 3. Status post endograft repair of thoracic aortic dissection. No acute complication on this noncontrast exam. 4. Trace left pleural fluid or thickening. 5. Right nephrolithiasis. 6. Right renal hemorrhagic/proteinaceous cyst. Electronically Signed   By: Abigail Miyamoto M.D.   On: 08/26/2016 15:52        Scheduled Meds: . amLODipine  2.5 mg Oral BID  . atorvastatin  20 mg Oral QHS  . azithromycin  500 mg Oral Daily  . cefTRIAXone (ROCEPHIN)  IV  1 g Intravenous Q24H  . enoxaparin (LOVENOX) injection  40 mg Subcutaneous Q24H  . guaiFENesin  600 mg Oral  BID  . nebivolol  10 mg Oral BID   Continuous Infusions:    LOS: 5 days    Carel Carrier, MD PhD [facility] Pager [phone]  If 7PM-7AM, please contact night-coverage [email or web address] Password [facility] 08/26/2016, 5:29 PM

## 2016-08-26 NOTE — Progress Notes (Signed)
SATURATION QUALIFICATIONS: (This note is used to comply with regulatory documentation for home oxygen)  Patient Saturations on Room Air at Rest = 90%  Patient Saturations on Room Air while Ambulating = 87%  Patient Saturations on 2 Liters of oxygen while Ambulating = 93%  Please briefly explain why patient needs home oxygen: 

## 2016-08-27 LAB — COMPREHENSIVE METABOLIC PANEL
ALK PHOS: 57 U/L (ref 38–126)
ALT: 60 U/L (ref 17–63)
ANION GAP: 8 (ref 5–15)
AST: 34 U/L (ref 15–41)
Albumin: 2.4 g/dL — ABNORMAL LOW (ref 3.5–5.0)
BILIRUBIN TOTAL: 0.7 mg/dL (ref 0.3–1.2)
BUN: 13 mg/dL (ref 6–20)
CALCIUM: 8.6 mg/dL — AB (ref 8.9–10.3)
CO2: 28 mmol/L (ref 22–32)
Chloride: 102 mmol/L (ref 101–111)
Creatinine, Ser: 0.72 mg/dL (ref 0.61–1.24)
Glucose, Bld: 91 mg/dL (ref 65–99)
Potassium: 3.9 mmol/L (ref 3.5–5.1)
SODIUM: 138 mmol/L (ref 135–145)
TOTAL PROTEIN: 6.9 g/dL (ref 6.5–8.1)

## 2016-08-27 LAB — CBC WITH DIFFERENTIAL/PLATELET
Basophils Absolute: 0 10*3/uL (ref 0.0–0.1)
Basophils Relative: 0 %
EOS ABS: 0.1 10*3/uL (ref 0.0–0.7)
Eosinophils Relative: 2 %
HEMATOCRIT: 40.6 % (ref 39.0–52.0)
HEMOGLOBIN: 13.2 g/dL (ref 13.0–17.0)
LYMPHS ABS: 2.4 10*3/uL (ref 0.7–4.0)
Lymphocytes Relative: 25 %
MCH: 29.5 pg (ref 26.0–34.0)
MCHC: 32.5 g/dL (ref 30.0–36.0)
MCV: 90.8 fL (ref 78.0–100.0)
MONOS PCT: 8 %
Monocytes Absolute: 0.8 10*3/uL (ref 0.1–1.0)
NEUTROS PCT: 65 %
Neutro Abs: 6.2 10*3/uL (ref 1.7–7.7)
Platelets: 253 10*3/uL (ref 150–400)
RBC: 4.47 MIL/uL (ref 4.22–5.81)
RDW: 13.6 % (ref 11.5–15.5)
WBC: 9.6 10*3/uL (ref 4.0–10.5)

## 2016-08-27 MED ORDER — FLORANEX PO PACK: 1.0000 g | PACK | Freq: Three times a day (TID) | ORAL | 0 refills | Status: DC

## 2016-08-27 MED ORDER — DOXYCYCLINE HYCLATE 100 MG PO CAPS: 100.0000 mg | ORAL_CAPSULE | Freq: Two times a day (BID) | ORAL | 0 refills | Status: AC

## 2016-08-27 NOTE — Care Management Note (Addendum)
Case Management Note  Patient Details  Name: Ray Fowler MRN: [id number] Date of Birth: 1941/07/24  Subjective/Objective:            Patient admitted form home with PNA. Verified home oxygen covered through [facility], referral made to [facility] w/ [facility] who watch for order. CM followed up with Dr Erlinda Hong, sent text page that new O2 test documented today. Anticipate home O2 order from Dr Erlinda Hong.         Action/Plan:  DC to home with home oxygen, pending oxygen order and delivery of transport tank to room.   Expected Discharge Date:  08/27/16               Expected Discharge Plan:  Home/Self Care  In-House Referral:  NA  Discharge planning Services  CM Consult  Post Acute Care Choice:    Choice offered to:     DME Arranged:    DME Agency:     HH Arranged:  NA HH Agency:  NA  Status of Service:  Completed, signed off  If discussed at [place] of Stay Meetings, dates discussed:    Additional Comments:  Carles Collet, RN 08/27/2016, 10:30 AM

## 2016-08-27 NOTE — Progress Notes (Signed)
Verified with [facility] that patient would qualify for home oxygen. Requested order form Dr Erlinda Hong via text page for Home O2 if she felt it was necessary.

## 2016-08-27 NOTE — Progress Notes (Signed)
SATURATION QUALIFICATIONS: (This note is used to comply with regulatory documentation for home oxygen)  Patient Saturations on Room Air at Rest = 90%  Patient Saturations on Room Air while Ambulating = 85%  Patient Saturations on 2 Liters of oxygen while Ambulating = 95%  Please briefly explain why patient needs home oxygen: 

## 2016-08-27 NOTE — Discharge Summary (Signed)
Discharge Summary  Ray Fowler [id number] DOB: 1940-12-01  PCP: Elsie Stain, MD  Admit date: 08/21/2016 Discharge date: 08/27/2016  Time spent: >91mins  Recommendations for Outpatient Follow-up:  1. F/u with PMD within a week  for hospital discharge follow up, repeat cbc/bmp at follow up 2. F/u with pulmonology Dr Priscille Loveless for multifocal pneumonia, consider repeat chest imaging to ensure resolution of pneumonia. 3. He is discharged home with home o2, continue wean o2 on outpatient basis.  Discharge Diagnoses:  Active Hospital Problems   Diagnosis Date Noted  . Community acquired pneumonia 08/21/2016  . Hypoxia 08/21/2016  . Cough 06/07/2015  . 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 CAD (coronary artery disease) 04/03/2009  . Aortic valve disorder 04/03/2009  . HYPERTRIGLYCERIDEMIA 05/07/2007  . Essential hypertension 05/07/2007    Resolved Hospital Problems   Diagnosis Date Noted Date Resolved  No resolved problems to display.    Discharge Condition: stable  Diet recommendation: heart healthy  Filed Weights   08/23/16 0430 08/24/16 0526 08/24/16 1834  Weight: 74.2 kg (163 lb 9.3 oz) 73.1 kg (161 lb 3.2 oz) 73 kg (161 lb)    History of present illness:   PCP: Elsie Stain, MD   Patient coming from:  Home   Chief Complaint: SHortness of breath and cough   HPI: Ray Fowler is a 76 y.o. male with significant cardiac history as listed below, known history pulmonary nodule, stable since 2011, recent flu like symptoms treated as outpatient by his PCP with Tamiflu in 08/17/2015,finishing the course today, presenting today to his PCP with worsening symptoms, including fever up to 100.4, chills, shortness of breath, green mucous production with cough, myalgias. He was afebrile on presentation. As his doctor's office, the patient was noted to have oxygen saturation,  87% inRA. The patient is not on oxygen at home.  EMS was called, the patient was brought to the ER, for further evaluation. At the ER, the chest x-ray was performed, suggestive of bilateral pneumonia. Influenza PCR is currently pending. Patient was placed on 5 L of oxygen mask, with improvement of his symptoms. He has afebrile, he is vital signs are stable, but he is still symptomatic for respiratory infection. He denies any headaches, sore throat, nausea, vomiting, or appetite changes. He denies any abdominal pain. He denies any lower extremity swelling. No confusion was reported.   ED Course:  BP 153/81   Pulse (!) 40   Resp 17   SpO2 95%    sodium 142 potassium 4.0 glucose 122 creatinine 0.8  BNP252 troponin 0 white count 14.9 hemoglobin 15, platelets 197  Smear  shows mild left shift at the ER he had a couple of PVCs, during coughing spells Hospital Course:  Active Problems:   HYPERTRIGLYCERIDEMIA   Essential hypertension   CAD (coronary artery disease)   Aortic valve disorder   Secondary cardiomyopathy (HCC)   Dissection of aorta (HCC)   Cough   Hypoxia   Community acquired pneumonia   Multifocal pneumonia/Left lower lobe pneumonia: likely post viral pneumonia -Respiratory viral panel positive for influenza A. Patient recently completed a course of Tamiflu treatment.  - Blood Cultures negative, sputum was not collected,mrsa screening negative. -he received IV ceftriaxone and azithromycin.  - clinically improving, but very slow, less cough, due to persistent oxygen requirement and leukocytosis, Ct chest on 1/14 done with multifocal pneumonia -patient clinically continue to improve, leukocytosis has normalized, he is discharged home with three more days  of doxycycline and outpatient follow up with pulmonology  Acute hypoxic respiratory failure:  requiring 5 L of oxygen initially.  - continue DuoNeb. Nebulization, Mucinex. - incentive spirometer -clinically he is feeling better, no fever, leukocytosis has normalized, but he  remains oxygen dependent, he is discharged home on home o2, f/u with pmd and pulmnology  Hypertension: Continue Norvasc, Bystolic. Monitor blood pressure.  H/o aortic dissection in 2010 with surgical repair. Followed by cardiology and ct surgery regularly  Right renal hemorrhagic/proteinaceous cyst. Incidental finding on chest CT, no back pain, cr and hgb wnl, no hematuria,  pmd follow up.  DVT prophylaxis while in the hospital: Lovenox Code Status: Full code Family Communication: No family present at bedside Disposition Plan: home with home o2    Consultants:   None  Procedures: None Antimicrobials: Ceftriaxone and azithromycin   Procedures:  none  Consultations:  none  Discharge Exam: BP (!) 168/63 (BP Location: Left Arm)   Pulse 62   Temp 98 F (36.7 C) (Oral)   Resp 16   Ht 5\' 10"  (1.778 m)   Wt 73 kg (161 lb)   SpO2 98%   BMI 23.10 kg/m   General: NAD Cardiovascular: RRR, + murmur Respiratory: improved, better aeration, no wheezing, mild left basilar crackles, ( he presented with bibasilar crackles) Extremities: no edema  Discharge Instructions You were cared for by a hospitalist during your hospital stay. If you have any questions about your discharge medications or the care you received while you were in the hospital after you are discharged, you can call the unit and asked to speak with the hospitalist on call if the hospitalist that took care of you is not available. Once you are discharged, your primary care physician will handle any further medical issues. Please note that NO REFILLS for any discharge medications will be authorized once you are discharged, as it is imperative that you return to your primary care physician (or establish a relationship with a primary care physician if you do not have one) for your aftercare needs so that they can reassess your need for medications and monitor your lab values.  Discharge Instructions    Diet - low  sodium heart healthy    Complete by:  As directed    Increase activity slowly    Complete by:  As directed      Allergies as of 08/27/2016      Reactions   Lactose Intolerance (gi)       Medication List    STOP taking these medications   oseltamivir 75 MG capsule Commonly known as:  TAMIFLU     TAKE these medications   albuterol 108 (90 Base) MCG/ACT inhaler Commonly known as:  PROVENTIL HFA;VENTOLIN HFA Inhale 2 puffs into the lungs every 6 (six) hours as needed for wheezing or shortness of breath.   amLODipine 2.5 MG tablet Commonly known as:  NORVASC Take 1 tablet (2.5 mg total) by mouth 2 (two) times daily.   aspirin 81 MG tablet Take one tablet every other day.   atorvastatin 20 MG tablet Commonly known as:  LIPITOR Take 1 tablet (20 mg total) by mouth at bedtime.   doxycycline 100 MG capsule Commonly known as:  VIBRAMYCIN Take 1 capsule (100 mg total) by mouth 2 (two) times daily.   fluticasone 110 MCG/ACT inhaler Commonly known as:  FLOVENT HFA INHALE ONE PUFF BY MOUTH TWICE DAILY. RINSE MOUTH AFTER USE..   fluticasone 50 MCG/ACT nasal spray Commonly known as:  FLONASE USE TWO SPRAYS IN EACH NOSTRIL DAILY   guaiFENesin 600 MG 12 hr tablet Commonly known as:  MUCINEX Take by mouth 2 (two) times daily as needed.   lactobacillus Pack Take 1 packet (1 g total) by mouth 3 (three) times daily with meals.   MACULAR VITAMIN BENEFIT Tabs Take 1 tablet by mouth daily. Lutein 25 mg and Zeaxanthin 5 mg   meclizine 25 MG tablet Commonly known as:  ANTIVERT Take 1 tablet (25 mg total) by mouth 3 (three) times daily as needed.   multivitamin with minerals Tabs tablet Take 1 tablet by mouth daily.   nebivolol 10 MG tablet Commonly known as:  BYSTOLIC Take 1 tablet (10 mg total) by mouth 2 (two) times daily.   promethazine 25 MG tablet Commonly known as:  PHENERGAN Take 1 tablet (25 mg total) by mouth every 6 (six) hours as needed for nausea or vomiting.       Allergies  Allergen Reactions  . Lactose Intolerance (Gi)    Follow-up Information    Simonne Maffucci, MD Follow up in 1 month(s).   Specialty:  Pulmonary Disease Why:  repeat chest imaging to ensure resolution of pneumonia Contact information: [place] [address] [phone]        Elsie Stain, MD Follow up in 1 week(s).   Specialty:  Family Medicine Why:  hospital discharge follow up Contact information: [place] [address] [phone]            The results of significant diagnostics from this hospitalization (including imaging, microbiology, ancillary and laboratory) are listed below for reference.    Significant Diagnostic Studies: Dg Chest 2 View  Result Date: 08/22/2016 CLINICAL DATA:  Productive cough and shortness of breath. Episode of pneumonia recently. EXAM: CHEST  2 VIEW COMPARISON:  PA and lateral chest x-ray of August 21, 2014 FINDINGS: The lungs remain hyperinflated with hemidiaphragm flattening. There is dense infiltrate in the left lower lobe. There are coarse interstitial lung markings in the right mid and lower lung which are stable. The heart is normal in size. And aortic stent graft is present extending from the aortic root to the junction of the thoracic aorta with the abdominal aorta. There is gas within the esophagus which appears deviated toward the right in the upper thorax. This is stable. IMPRESSION: Dense left lower lobe pneumonia, stable. Patchy interstitial densities in the right mid and lower lung worrisome for early interstitial pneumonia. No CHF. Previously placed aortic stent graft throughout the entire course of the thoracic aorta. Electronically Signed   By: David  Martinique M.D.   On: 08/22/2016 08:43   Dg Chest 2 View  Result Date: 08/21/2016 CLINICAL DATA:  Cough with hypoxia and shortness of breath. EXAM: CHEST  2 VIEW COMPARISON:  10/27/2015 FINDINGS: Again noted are aortic stent grafts involving  the ascending thoracic aorta, aortic arch and descending thoracic aorta. There are patchy densities in the mid and lower lungs bilaterally. There appears to be extensive consolidation or airspace disease in the left retrocardiac region. Upper lungs are clear. Heart size is within normal limits and stable. Upper mediastinum is prominent but stable. Again noted are surgical clips in the upper chest. IMPRESSION: Patchy densities in both lower lungs. Findings are suggestive for bilateral pneumonia. Electronically Signed   By: Markus Daft M.D.   On: 08/21/2016 13:24   Ct Chest Wo Contrast  Result Date: 08/26/2016 CLINICAL DATA:  Followup of pneumonia. 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 EXAM: CT CHEST WITHOUT CONTRAST TECHNIQUE: Multidetector CT imaging of the chest was performed following the standard protocol without IV contrast. COMPARISON:  Plain films of 08/22/2016. Most recent CT of 09/11/2011. FINDINGS: Cardiovascular: Status post endograft repair of the entire thoracic aorta. No acute complication identified. Prior median sternotomy. Native coronary artery atherosclerosis. Bypassed graft supplying the great vessels, originating from the proximal ascending aorta just below the graft. Example image 68/ series 201. Mediastinum/Nodes: Surgical clips in the right axilla. AP window nodes measure up to the 14 mm on image 43/series 201. Subcarinal node measures 1.3 cm on image 60/ series 201. Hilar regions poorly evaluated without intravenous contrast. Lungs/Pleura: Trace left-sided pleural fluid or thickening. Bronchial wall thickening is most apparent in the right lower lobe. There is debris or secretions in the right lower lobe endobronchial tree, including on image 92/ series 205. Azygos fissure. Mild centrilobular emphysema. Multifocal bilateral patchy airspace and nodular opacities with a slight peribronchovascular distribution. Patchy areas of consolidation in the lower lobes. Upper  Abdomen: Normal imaged portions of the liver, spleen, stomach, pancreas, gallbladder, biliary tract, adrenal glands. Fluid density right renal lesions are most consistent with cysts. Nonobstructive 4 mm right renal collecting system calculus. An interpolar right renal lesion measures 1.9 cm and 63 HU. This was similar in size on 07/21/2015 and was consistent with a complex cyst on that study. Musculoskeletal: No acute osseous abnormality. IMPRESSION: 1. Multifocal airspace and ground-glass opacities are most consistent with infection, including atypical etiologies. 2. Thoracic adenopathy is mild and may be reactive. Follow-up chest CT at 3-6 months could confirm stability or resolution. 3. Status post endograft repair of thoracic aortic dissection. No acute complication on this noncontrast exam. 4. Trace left pleural fluid or thickening. 5. Right nephrolithiasis. 6. Right renal hemorrhagic/proteinaceous cyst. Electronically Signed   By: Abigail Miyamoto M.D.   On: 08/26/2016 15:52    Microbiology: Recent Results (from the past 240 hour(s))  Culture, blood (routine x 2) Call MD if unable to obtain prior to antibiotics being given     Status: None   Collection Time: 08/21/16  8:07 PM  Result Value Ref Range Status   Specimen Description BLOOD RIGHT ANTECUBITAL  Final   Special Requests BOTTLES DRAWN AEROBIC AND ANAEROBIC [place]  Final   Culture NO GROWTH 5 DAYS  Final   Report Status 08/26/2016 FINAL  Final  Culture, blood (routine x 2) Call MD if unable to obtain prior to antibiotics being given     Status: None   Collection Time: 08/21/16  8:17 PM  Result Value Ref Range Status   Specimen Description BLOOD RIGHT FOREARM  Final   Special Requests BOTTLES DRAWN AEROBIC AND ANAEROBIC 4CC EACH  Final   Culture NO GROWTH 5 DAYS  Final   Report Status 08/26/2016 FINAL  Final  Respiratory Panel by PCR     Status: Abnormal   Collection Time: 08/21/16  9:08 PM  Result Value Ref Range Status   Adenovirus  NOT DETECTED NOT DETECTED Final   Coronavirus 229E NOT DETECTED NOT DETECTED Final   Coronavirus HKU1 NOT DETECTED NOT DETECTED Final   Coronavirus NL63 NOT DETECTED NOT DETECTED Final   Coronavirus OC43 NOT DETECTED NOT DETECTED Final   Metapneumovirus NOT DETECTED NOT DETECTED Final   Rhinovirus / Enterovirus NOT DETECTED NOT DETECTED Final   Influenza A H3 DETECTED (A) NOT DETECTED Final   Influenza B NOT DETECTED NOT DETECTED Final   Parainfluenza  Virus 1 NOT DETECTED NOT DETECTED Final   Parainfluenza Virus 2 NOT DETECTED NOT DETECTED Final   Parainfluenza Virus 3 NOT DETECTED NOT DETECTED Final   Parainfluenza Virus 4 NOT DETECTED NOT DETECTED Final   Respiratory Syncytial Virus NOT DETECTED NOT DETECTED Final   Bordetella pertussis NOT DETECTED NOT DETECTED Final   Chlamydophila pneumoniae NOT DETECTED NOT DETECTED Final   Mycoplasma pneumoniae NOT DETECTED NOT DETECTED Final  MRSA PCR Screening     Status: None   Collection Time: 08/25/16  5:53 PM  Result Value Ref Range Status   MRSA by PCR NEGATIVE NEGATIVE Final    Comment:        The GeneXpert MRSA Assay (FDA approved for NASAL specimens only), is one component of a comprehensive MRSA colonization surveillance program. It is not intended to diagnose MRSA infection nor to guide or monitor treatment for MRSA infections.      Labs: Basic Metabolic Panel:  Recent Labs Lab 08/21/16 1326 08/22/16 0443 08/23/16 0417 08/24/16 0527 08/26/16 0652 08/27/16 0417  NA 142 140 138 141 139 138  K 4.0 3.8 3.6 3.9 4.0 3.9  CL 105 104 103 104 104 102  CO2 28 28 28 28 26 28   GLUCOSE 122* 108* 106* 97 87 91  BUN 24* 16 9 7 13 13   CREATININE 0.81 0.74 0.64 0.71 0.66 0.72  CALCIUM 9.0 8.3* 8.2* 8.7* 8.4* 8.6*  MG 2.1  --   --   --  1.9  --    Liver Function Tests:  Recent Labs Lab 08/22/16 0443 08/26/16 0652 08/27/16 0417  AST 51* 39 34  ALT 65* 65* 60  ALKPHOS 66 55 57  BILITOT 0.8 0.9 0.7  PROT 6.1* 6.6 6.9    ALBUMIN 2.4* 2.3* 2.4*   No results for input(s): LIPASE, AMYLASE in the last 168 hours. No results for input(s): AMMONIA in the last 168 hours. CBC:  Recent Labs Lab 08/21/16 1326 08/22/16 0443 08/23/16 0417 08/24/16 0527 08/26/16 0652 08/27/16 0417  WBC 14.9* 12.7* 13.8* 14.0* 11.1* 9.6  NEUTROABS 10.2*  --   --   --  8.0* 6.2  HGB 15.0 13.4 13.2 14.1 12.7* 13.2  HCT 45.2 41.3 40.8 43.4 39.5 40.6  MCV 89.7 91.0 89.5 89.9 91.2 90.8  PLT 197 197 239 260 231 253   Cardiac Enzymes: No results for input(s): CKTOTAL, CKMB, CKMBINDEX, TROPONINI in the last 168 hours. BNP: BNP (last 3 results)  Recent Labs  08/21/16 1326  BNP 252.3*    ProBNP (last 3 results) No results for input(s): PROBNP in the last 8760 hours.  CBG: No results for input(s): GLUCAP in the last 168 hours.     SignedFlorencia Reasons MD, PhD  [facility] 08/27/2016, 8:42 AM

## 2016-08-27 NOTE — Progress Notes (Signed)
Patient BP 168/63.  Notified A. Hugelmeyer on call.

## 2016-09-06 ENCOUNTER — Ambulatory Visit (INDEPENDENT_AMBULATORY_CARE_PROVIDER_SITE_OTHER): Payer: Medicare Other | Admitting: Family Medicine

## 2016-09-06 ENCOUNTER — Encounter: Payer: Self-pay | Admitting: Family Medicine

## 2016-09-06 VITALS — BP 136/72 | HR 72 | Temp 98.2°F | Wt 163.0 lb

## 2016-09-06 DIAGNOSIS — J181 Lobar pneumonia, unspecified organism: Secondary | ICD-10-CM

## 2016-09-06 DIAGNOSIS — Z8701 Personal history of pneumonia (recurrent): Secondary | ICD-10-CM

## 2016-09-06 DIAGNOSIS — J189 Pneumonia, unspecified organism: Secondary | ICD-10-CM | POA: Diagnosis not present

## 2016-09-06 LAB — COMPREHENSIVE METABOLIC PANEL
ALK PHOS: 53 U/L (ref 39–117)
ALT: 37 U/L (ref 0–53)
AST: 29 U/L (ref 0–37)
Albumin: 3.9 g/dL (ref 3.5–5.2)
BILIRUBIN TOTAL: 0.7 mg/dL (ref 0.2–1.2)
BUN: 16 mg/dL (ref 6–23)
CO2: 33 mEq/L — ABNORMAL HIGH (ref 19–32)
CREATININE: 0.85 mg/dL (ref 0.40–1.50)
Calcium: 9.6 mg/dL (ref 8.4–10.5)
Chloride: 103 mEq/L (ref 96–112)
GFR: 93.55 mL/min (ref >60.00)
Glucose, Bld: 119 mg/dL — ABNORMAL HIGH (ref 70–99)
POTASSIUM: 4.2 meq/L (ref 3.5–5.1)
SODIUM: 141 meq/L (ref 135–145)
Total Protein: 7.7 g/dL (ref 6.0–8.3)

## 2016-09-06 LAB — CBC WITH DIFFERENTIAL/PLATELET
BASOS ABS: 0 10*3/uL (ref 0.0–0.1)
Basophils Relative: 0.4 % (ref 0.0–3.0)
EOS ABS: 0.1 10*3/uL (ref 0.0–0.7)
EOS PCT: 2 % (ref 0.0–5.0)
HCT: 44 % (ref 39.0–52.0)
HEMOGLOBIN: 14.7 g/dL (ref 13.0–17.0)
Lymphocytes Relative: 28.6 % (ref 12.0–46.0)
Lymphs Abs: 2.1 10*3/uL (ref 0.7–4.0)
MCHC: 33.5 g/dL (ref 30.0–36.0)
MCV: 88.6 fl (ref 78.0–100.0)
MONO ABS: 0.7 10*3/uL (ref 0.1–1.0)
Monocytes Relative: 9.8 % (ref 3.0–12.0)
NEUTROS PCT: 59.2 % (ref 43.0–77.0)
Neutro Abs: 4.3 10*3/uL (ref 1.4–7.7)
Platelets: 219 10*3/uL (ref 150.0–400.0)
RBC: 4.96 Mil/uL (ref 4.22–5.81)
RDW: 14.7 % (ref 11.5–15.5)
WBC: 7.2 10*3/uL (ref 4.0–10.5)

## 2016-09-06 NOTE — Progress Notes (Signed)
Admit date: 08/21/2016 Discharge date: 08/27/2016  Time spent: >8mins  Recommendations for Outpatient Follow-up:  1. F/u with PMD within a week  for hospital discharge follow up, repeat cbc/bmp at follow up 2. F/u with pulmonology Dr Priscille Loveless for multifocal pneumonia, consider repeat chest imaging to ensure resolution of pneumonia. 3. He is discharged home with home o2, continue wean o2 on outpatient basis.  Discharge Diagnoses:       Active Hospital Problems   Diagnosis Date Noted  . Community acquired pneumonia 08/21/2016  . Hypoxia 08/21/2016  . Cough 06/07/2015  . 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 CAD (coronary artery disease) 04/03/2009  . Aortic valve disorder 04/03/2009  . HYPERTRIGLYCERIDEMIA 05/07/2007  . Essential hypertension 05/07/2007    Resolved Hospital Problems   Diagnosis Date Noted Date Resolved  No resolved problems to display.    Discharge Condition: stable  Diet recommendation: heart healthy       Filed Weights   08/23/16 0430 08/24/16 0526 08/24/16 1834  Weight: 74.2 kg (163 lb 9.3 oz) 73.1 kg (161 lb 3.2 oz) 73 kg (161 lb)    History of present illness:   PCP: Elsie Stain, MD  Patient coming from: Home   Chief Complaint: SHortness of breath and cough   HPI: Murel Guijosa Whitesellis a 75 y.o.malewith significant cardiac history as listed below, known history pulmonary nodule, stable since 2011, recent flu like symptoms treated as outpatient by his PCP with Tamiflu in 08/17/2015,finishing the course today, presenting today to his PCP with worsening symptoms, including fever up to 100.4, chills, shortness of breath, green mucous production with cough, myalgias. He was afebrile on presentation. As his doctor's office, the patient was noted to have oxygen saturation, 87% inRA. The patient is not on oxygen at home. EMS was called, the patient was brought to the ER, for further  evaluation. At the ER, the chest x-ray was performed, suggestive of bilateral pneumonia. Influenza PCR is currently pending. Patient was placed on 5 L of oxygen mask, with improvement of his symptoms. He has afebrile, he is vital signs are stable, but he is still symptomatic for respiratory infection. He denies any headaches, sore throat, nausea, vomiting, or appetite changes. He denies any abdominal pain. He denies any lower extremity swelling. No confusion was reported.   ED Course:BP 153/81  Pulse (!) 40  Resp 17  SpO2 95%  sodium 142 potassium 4.0 glucose 122 creatinine 0.8 BNP252 troponin 0 white count 14.9 hemoglobin 15, platelets 197  Smear shows mild left shift at the ER he had a couple of PVCs, during coughing spells Hospital Course:  Active Problems:   HYPERTRIGLYCERIDEMIA   Essential hypertension   CAD (coronary artery disease)   Aortic valve disorder   Secondary cardiomyopathy ([facility])   Dissection of aorta ([facility])   Cough   Hypoxia   Community acquired pneumonia   Multifocal pneumonia/Left lower lobe pneumonia: likely post viral pneumonia -Respiratory viral panel positive for influenza A. Patient recently completed a course of Tamiflu treatment.  - Blood Cultures negative, sputum was not collected,mrsa screening negative. -he received IV ceftriaxone and azithromycin. - clinically improving, but very slow, less cough, due to persistent oxygen requirement and leukocytosis, Ct chest on 1/14 done with multifocal pneumonia -patient clinically continue to improve, leukocytosis has normalized, he is discharged home with three more days of doxycycline and outpatient follow up with pulmonology  Acute hypoxic respiratory failure: requiring 5 L of oxygen initially.  -  continue DuoNeb. Nebulization, Mucinex. - incentive spirometer -clinically he is feeling better, no fever, leukocytosis has normalized, but he remains oxygen dependent, he is discharged home on home o2,  f/u with pmd and pulmnology  Hypertension: Continue Norvasc, Bystolic. Monitor blood pressure.  H/o aortic dissection in 2010 with surgical repair. Followed by cardiology and ct surgery regularly  Right renal hemorrhagic/proteinaceous cyst. Incidental finding on chest CT, no back pain, cr and hgb wnl, no hematuria,  pm follow up.  DVT prophylaxis while in the hospital: Lovenox Code Status: Full code Family Communication: No family present at bedside Disposition Plan: home with home o2  ============================================ Admitted with CAP.  Post flu multifocal PNA.  Treated with O2, abx, antivirals, gradually improved and d/c'd to home with O2 for prn use.  Here for follow up.  Now-No fevers.  Some fatigue.  Much less cough, minimal cough now.  No sputum.  He isn't back to baseline but clearly improved in the meantime.    CT with mild LA noted.  He'll have f/u CT for aorta at [facility] in 10/2016 and I'll ask them to follow the LA also.  D/w pt.  He shouldn't need 2 separate scans.  LA was likely reactive with PNA at the time.   Incidental renal cyst noted, d/w pt.  Likely not significant.   PMH and SH reviewed  ROS: Per HPI unless specifically indicated in ROS section   Meds, vitals, and allergies reviewed.   GEN: nad, alert and oriented HEENT: mucous membranes moist NECK: supple w/o LA CV: rrr.  PULM: ctab, no inc wob ABD: soft, +bs EXT: no edema SKIN: no acute rash

## 2016-09-06 NOTE — Patient Instructions (Addendum)
Ray Fowler will call about your referral. Go to the lab on the way out.  We'll contact you with your lab report. If you have low sats (<90%) the let me know.   Okay to use oxygen as needed.    Recheck CXR in about 1 month.   I'll notify [facility] about the lymph nodes on the last CT so they can check that on your CT in 10/2016.  Take care.  Glad to see you.

## 2016-09-06 NOTE — Progress Notes (Signed)
Pre visit review using our clinic review tool, if applicable. No additional management support is needed unless otherwise documented below in the visit note. 

## 2016-09-07 ENCOUNTER — Encounter: Payer: Self-pay | Admitting: Family Medicine

## 2016-09-07 NOTE — Assessment & Plan Note (Signed)
Needs 6 wk CXR, d/w pt.   Will have routine f/u CT at [facility] re: aorta.   Due for cmet and cbc.   See notes on labs.  Pulmonary consult d/w pt.   Ordered.   Intermittently using O2 now.  No O2 sats <90% in the last few days.   Okay to use prn O2, he'll update me.   He agrees.  Okay for outpatient f/u.  >25 minutes spent in face to face time with patient, >50% spent in counselling or coordination of care .

## 2016-09-09 ENCOUNTER — Telehealth: Payer: Self-pay | Admitting: Family Medicine

## 2016-09-09 NOTE — Telephone Encounter (Signed)
Please send a letter to his team at [facility] re: f/u CT scan.   [facility], [place]  [facility] [address], [place] [address]  [phone]  [phone] (Fax)    Gretchen Portela, MD  [address] [place], [place] [address]  [phone]  [phone] (Fax)   Patient recently had PNA with lymphadenopathy noted on CT.   He'll have routine follow up CT for aorta at [facility] in 10/2016 and I need them to follow the lymphadenopathy also with that scan.  He shouldn't need two separate scans.  The lymphadenopathy was likely reactive with PNA at the time.   Many thanks.

## 2016-09-10 ENCOUNTER — Encounter: Payer: Self-pay | Admitting: *Deleted

## 2016-09-10 NOTE — Telephone Encounter (Signed)
Letters mailed as instructed.

## 2016-09-24 ENCOUNTER — Ambulatory Visit (INDEPENDENT_AMBULATORY_CARE_PROVIDER_SITE_OTHER)
Admission: RE | Admit: 2016-09-24 | Discharge: 2016-09-24 | Disposition: A | Discharge status: Home / Self Care | Payer: Medicare Other | Source: Ambulatory Visit | Attending: Family Medicine | Admitting: Family Medicine

## 2016-09-24 ENCOUNTER — Telehealth: Payer: Self-pay | Admitting: Radiology

## 2016-09-24 DIAGNOSIS — Z8701 Personal history of pneumonia (recurrent): Secondary | ICD-10-CM

## 2016-09-24 NOTE — Telephone Encounter (Signed)
Pt came in for his repeat cxr, needs you to send a discharge order for the oxygen that is at his home. He came home on it and used it for about 3 days. Doing fine now. Please fax DC order to [place], Fax # [phone]

## 2016-09-25 ENCOUNTER — Encounter: Payer: Self-pay | Admitting: *Deleted

## 2016-09-25 NOTE — Telephone Encounter (Signed)
Please send order to d/c O2.  Thanks.

## 2016-09-25 NOTE — Telephone Encounter (Signed)
Order faxed to [place]. Patient notified by telephone that order has been sent over to discontinue oxygen.

## 2016-09-27 ENCOUNTER — Other Ambulatory Visit: Payer: Self-pay | Admitting: Family Medicine

## 2016-09-27 DIAGNOSIS — R9389 Abnormal findings on diagnostic imaging of other specified body structures: Secondary | ICD-10-CM

## 2016-10-03 ENCOUNTER — Ambulatory Visit (INDEPENDENT_AMBULATORY_CARE_PROVIDER_SITE_OTHER): Payer: Medicare Other | Admitting: Cardiovascular Disease

## 2016-10-03 ENCOUNTER — Encounter: Payer: Self-pay | Admitting: Cardiovascular Disease

## 2016-10-03 VITALS — BP 142/64 | HR 75 | Ht 72.0 in | Wt 168.2 lb

## 2016-10-03 DIAGNOSIS — J9601 Acute respiratory failure with hypoxia: Secondary | ICD-10-CM

## 2016-10-03 DIAGNOSIS — I7101 Dissection of thoracic aorta: Secondary | ICD-10-CM | POA: Diagnosis not present

## 2016-10-03 DIAGNOSIS — I2510 Atherosclerotic heart disease of native coronary artery without angina pectoris: Secondary | ICD-10-CM

## 2016-10-03 DIAGNOSIS — I10 Essential (primary) hypertension: Secondary | ICD-10-CM | POA: Diagnosis not present

## 2016-10-03 DIAGNOSIS — I71019 Dissection of thoracic aorta, unspecified: Secondary | ICD-10-CM

## 2016-10-03 DIAGNOSIS — I493 Ventricular premature depolarization: Secondary | ICD-10-CM | POA: Diagnosis not present

## 2016-10-03 DIAGNOSIS — Z9889 Other specified postprocedural states: Secondary | ICD-10-CM | POA: Diagnosis not present

## 2016-10-03 NOTE — Progress Notes (Signed)
Cardiology Office Note  Date:  10/04/2016   ID:  Britt, Borst November 18, 1940, MRN [id number]  PCP:  Elsie Stain, MD   Chief Complaint  Patient presents with  . other    6 month follow up. Patint states he is doing well. Meds reviewed verbally wiht patient.     HPI:  Ray Fowler is a 76 y/o male with h/o HTN, hyperlipidemia, Type A aortic dissection in [id number] complicated by cardiac tamponade, CT showed extension of the dissection from the aortic root all the way down to the femoral arteries, s/p aortic root replacement of hemiarch repair and aortic valve resuspension. Post-op course complicated by persistent HTN and atrial flutter treated with amio (stopped in January 2011). He has followup with cardiothoracic surgery at [facility] on an annual basis with CT scan surveillance. CT September 2014 showing remodeling of his thoracic aorta, overall looked good/stable. He presents for routine followup of his of his hypertension And aortic dissection  In follow-up today he reports that he is doing relatively well Last CT 04/2015 at [facility] Scheduled for repeat CT scan in late 2018  In hospital 08/21/2016 to 1/15 Had flu and PNA 08/2016 CT 08/26/2016 at [facility] recovering, scant cough  Sinus headaches past week Taking over-the-counter medication Reports having palpitations  Blood pressure typically well controlled at home  Tolerating Lipitor 20 mg daily Previous CT scan from [facility] hospital September 2016, stable disease  subclavian atherosclerosis, as well as other places, mild.  stable aneurysm  EKG on today's visit shows normal sinus rhythm with rate 75 bpm, nonspecific ST abnormality, PVCs   Other past medical history Prior ECHO showed EF 45-50%. Had cardiac CT in 12/10 after abnormal myvoiew. Coronaries normal except for 25% RCA. 61mm stable pulmonary nodule.  echo (02/2010) EF 55-60% with grade 1 diastolic dysfx and previous AO dissection flap.  pneumonia in  November 2012 with chest x-ray showing dilated aorta. 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 he was referred to [facility] for surgery. He had an arch branching procedure involving neo-ascending aorta to the right common carotid and right subclavian arteries individually, and branch to the left common carotid artery, concomittant left carotid to the subclavian artery bypass in the neck with endovascular exclusion of the aneurysm with endograft to extend from the hemi-arch back autograft down to the distal descending thoracic aorta.  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  PSH:    Past Surgical History:  Procedure Laterality Date  . ABDOMINAL AORTIC ANEURYSM REPAIR  march 2013  . 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 CARDIAC SURGERY  2013   at [facility]- transverse aortic arch/descending aneurysm repair s/p debranching procedure  (total of 5 Gore C-TAG devices extending from distal descending aorta to his prev placed ascending dacron graft)  . COLONOSCOPY    . Flex- Polyp   06/1999   at 38cm, Int/Ext Hemm divertics Fuller Plan)  . Hemi Arch Replacement  03/2009 and repeat 10/2011   and AoV resuspension w/ Repl of Ascending Aorta and Hemiarch w/ Graft; Pericardial Tamponade Aortic Regurg 8/22-8/28/10;  endovascular repair of aortic dilation 2013 with  ascending aorta to L common carotid, R subclavian, R common carotid and L carotid  subclavian bypass  . [facility]  08/22-8/28/2010   Thoracic aortic dissection pericardial teamponade aortic regurg  . PENILE PROSTHESIS IMPLANT  1996  . POLYPECTOMY    . PROSTATECTOMY  12/1993   Dr. Risa Grill    Current Outpatient  Prescriptions  Medication Sig Dispense Refill  . albuterol (PROVENTIL HFA;VENTOLIN HFA) 108 (90 Base) MCG/ACT inhaler Inhale 2 puffs into the lungs every 6 (six) hours as needed for wheezing or shortness of breath. 1 Inhaler 3  . amLODipine (NORVASC) 2.5 MG tablet Take 1 tablet (2.5 mg total) by mouth 2 (two) times daily. 180 tablet 3  . aspirin 81 MG tablet Take one tablet every other day.    Marland Kitchen atorvastatin (LIPITOR) 20 MG tablet Take 1 tablet (20 mg total) by mouth at bedtime. 90 tablet 3  . fluticasone (FLONASE) 50 MCG/ACT nasal spray USE TWO SPRAYS IN EACH NOSTRIL DAILY 16 g 11  . fluticasone (FLOVENT HFA) 110 MCG/ACT inhaler INHALE ONE PUFF BY MOUTH TWICE DAILY. RINSE MOUTH AFTER USE.. 12 g 3  . guaiFENesin (MUCINEX) 600 MG 12 hr tablet Take by mouth 2 (two) times daily as needed.    . meclizine (ANTIVERT) 25 MG tablet Take 1 tablet (25 mg total) by mouth 3 (three) times daily as needed. 90 tablet 3  . Multiple Vitamin (MULITIVITAMIN WITH MINERALS) TABS Take 1 tablet by mouth daily.    . Multiple Vitamins-Minerals (MACULAR VITAMIN BENEFIT) TABS Take 1 tablet by mouth daily. Lutein 25 mg and Zeaxanthin 5 mg    . nebivolol (BYSTOLIC) 10 MG tablet Take 1 tablet (10 mg total) by mouth 2 (two) times daily. 180 tablet 3  . promethazine (PHENERGAN) 25 MG tablet Take 1 tablet (25 mg total) by mouth every 6 (six) hours as needed for nausea or vomiting. 30 tablet 3   No current facility-administered medications for this visit.      Allergies:   Lactose intolerance (gi)   Social History:  The patient  reports that he quit smoking about 22 years ago. His smoking use included Cigarettes. He has a 25.00 pack-year smoking history. He has never used smokeless tobacco. He reports that he drinks about 0.6 oz of alcohol per week . He reports that he does not use drugs.   Family History:   family history includes Diabetes in his other and other; Heart disease in his father and mother; Heart failure in his  mother; Hyperlipidemia in his daughter; Hypertension in his mother; Kidney disease in his father; Kidney failure in his father; Stroke in his father.    Review of Systems: Review of Systems  Constitutional: Negative.   Respiratory: Positive for cough.   Cardiovascular: Positive for palpitations.  Gastrointestinal: Negative.   Musculoskeletal: Negative.   Neurological: Negative.   Psychiatric/Behavioral: Negative.   All other systems reviewed and are negative.    PHYSICAL EXAM: VS:  BP (!) 142/64 (BP Location: Left Arm, Patient Position: Sitting, Cuff Size: Normal)   Pulse 75   Ht 6' (1.829 m)   Wt 168 lb 4 oz (76.3 kg)   BMI 22.82 kg/m  , BMI Body mass index is 22.82 kg/m. GEN: Well nourished, well developed, in no acute distress  HEENT: normal  Neck: no JVD, carotid bruits, or masses Cardiac: RRR; ectopy noted, no murmurs, rubs, or gallops,no edema  Respiratory:  clear to auscultation bilaterally, normal work of breathing GI: soft, nontender, nondistended, + BS MS: no deformity or atrophy  Skin: warm and dry, no rash Neuro:  Strength  and sensation are intact Psych: euthymic mood, full affect    Recent Labs: 08/21/2016: B Natriuretic Peptide 252.3 08/26/2016: Magnesium 1.9 09/06/2016: ALT 37; BUN 16; Creatinine, Ser 0.85; Hemoglobin 14.7; Platelets 219.0; Potassium 4.2; Sodium 141    Lipid Panel Lab Results  Component Value Date   CHOL 129 08/15/2016   HDL 36.00 (L) 08/15/2016   LDLCALC 68 08/15/2016   TRIG 126.0 08/15/2016      Wt Readings from Last 3 Encounters:  10/04/16 169 lb 3.2 oz (76.7 kg)  10/03/16 168 lb 4 oz (76.3 kg)  09/06/16 163 lb (73.9 kg)       ASSESSMENT AND PLAN:  Essential hypertension - Plan: EKG 12-Lead Blood pressure is well controlled on today's visit. No changes made to the medications.  Coronary artery disease involving native coronary artery of native heart without angina pectoris Currently with no symptoms of angina. No  further workup at this time. Continue current medication regimen. Cholesterol is at goal on the current lipid regimen.   Frequent PVCs Frequent PVCs on EKG today Likely in the setting of sinus infection, recovering from pneumonia/flu Recommended that he could increase his beta blocker as needed  Dissection of thoracic aorta ([place]), H/O ascending aorta repair Followed at [facility], scant every 18 month  Acute respiratory failure with hypoxia ([place]) Recovered from recent pneumonia, flu   Total encounter time more than 25 minutes  Greater than 50% was spent in counseling and coordination of care with the patient   Disposition:   F/U  12 months   Orders Placed This Encounter  Procedures  . EKG 12-Lead     Signed, Esmond Plants, M.D., Ph.D. 10/04/2016  [place], [place]

## 2016-10-03 NOTE — Patient Instructions (Addendum)
Medication Instructions:   No medication changes made  Ok to take extra bystolic for PVCs  Labwork:  No new labs needed  Testing/Procedures:  No further testing at this time   I recommend watching educational videos on topics of interest to you at:       [email or web address]  Enter code: [email or web address]    Follow-Up: It was a pleasure seeing you in the office today. Please call us if you have new issues that need to be addressed before your next appt.  [phone]  Your physician wants you to follow-up in: 12 months.  You will receive a reminder letter in the mail two months in advance. If you don't receive a letter, please call our office to schedule the follow-up appointment.  If you need a refill on your cardiac medications before your next appointment, please call your pharmacy.

## 2016-10-04 ENCOUNTER — Ambulatory Visit (INDEPENDENT_AMBULATORY_CARE_PROVIDER_SITE_OTHER): Payer: Medicare Other | Admitting: Pulmonary Disease

## 2016-10-04 ENCOUNTER — Ambulatory Visit (INDEPENDENT_AMBULATORY_CARE_PROVIDER_SITE_OTHER)
Admission: RE | Admit: 2016-10-04 | Discharge: 2016-10-04 | Disposition: A | Discharge status: Home / Self Care | Payer: Medicare Other | Source: Ambulatory Visit | Attending: Pulmonary Disease | Admitting: Pulmonary Disease

## 2016-10-04 ENCOUNTER — Encounter: Payer: Self-pay | Admitting: Pulmonary Disease

## 2016-10-04 VITALS — BP 132/76 | HR 86 | Ht 72.0 in | Wt 169.2 lb

## 2016-10-04 DIAGNOSIS — J432 Centrilobular emphysema: Secondary | ICD-10-CM | POA: Insufficient documentation

## 2016-10-04 DIAGNOSIS — J189 Pneumonia, unspecified organism: Secondary | ICD-10-CM

## 2016-10-04 DIAGNOSIS — J9601 Acute respiratory failure with hypoxia: Secondary | ICD-10-CM | POA: Diagnosis not present

## 2016-10-04 DIAGNOSIS — J181 Lobar pneumonia, unspecified organism: Secondary | ICD-10-CM

## 2016-10-04 DIAGNOSIS — R0602 Shortness of breath: Secondary | ICD-10-CM | POA: Diagnosis not present

## 2016-10-04 DIAGNOSIS — J3089 Other allergic rhinitis: Secondary | ICD-10-CM

## 2016-10-04 NOTE — Progress Notes (Signed)
Subjective:    Patient ID: Ray Fowler, male    DOB: November 06, 1940, 76 y.o.   MRN: [id number]  Synopsis: Referred to [facility] for healthcare associated pneumonia in 2013. He did not follow-up after that visit. He previously smoked one pack of cigarettes daily for many years (he is not sure how many) and quit in 1995. He returned in 2018 after a bout of pneumonia in the setting of influenza. He was hospitalized and discharged on exertional oxygen.  HPI Chief Complaint  Patient presents with  . Advice Only    per Dr. Damita Dunnings. pt admitted for PNA & flu on 08/21/16. pt states breathing has improved since being discharged. pt c/o weakness & fatigued.    Ray Fowler saw me back in 2013 in [place], but it has been a while since then.  He had cough, dyspnea back in January and was admitted to [facility] hospital for the same. His symptoms started around August 15, 2016 and he saw his PCP who gave him tamiflu which he took.  However despite this his symptoms worsened and he ended up in the hospital.  He said that the fever and dry cough persisted.  He was discharged on oxygen.    He says he is feeling OK now but still feels a little weak.  He says he worked in the yard yesterday. No cough.    Prior to this hospitalization he doesn't remember having any bronchitis.  He has struggled with hay fever.  He still takes Flovent on a prn basis.  He is staking flonase nasal spray.    In the last year he didn't have trouble breathing when doing yard work.    He previously smoked 1ppd and quit in 1995.   Past Medical History:  Diagnosis Date  . Allergic rhinitis   . Aortic arch aneurysm ([place]) 10/2011  . Aortic dissection ([place]) 2010   Type A  . BCC (basal cell carcinoma of skin) 2014  . Carotid bruit    Right. Carotid u/s 2/11 normal.  . Diverticulosis   . HTN (hypertension)   . Hyperlipidemia   . Pneumonia   . Prostate cancer ([place])   . Pulmonary nodule    Stable on CT 2/11  . Renal stones   .  S/P CABG x 1   . Skin cancer    scalp 2007     Family History  Problem Relation Age of Onset  . Heart failure Mother     died age 67  . Hypertension Mother   . Heart disease Mother     CHF  . Kidney failure Father   . Heart disease Father     coronary disease, CABG  . Stroke Father     due to "infection" from [place]  . Kidney disease Father     chronic  . Diabetes Other   . Diabetes Other   . Hyperlipidemia Daughter   . Colon cancer Neg Hx   . Esophageal cancer Neg Hx   . Stomach cancer Neg Hx   . Rectal cancer Neg Hx      Social History   Social History  . Marital status: Married    Spouse name: N/A  . Number of children: 3  . Years of education: N/A   Occupational History  . Retired Retired    [employer] in [place]  . Smoking status: Former Smoker    Packs/day: 1.00    Years: 25.00  Types: Cigarettes    Quit date: 07/10/1994  . Smokeless tobacco: Never Used     Comment: quit 1995  . Alcohol use 0.6 oz/week    1 Standard drinks or equivalent per week     Comment: occassionally wine  . Drug use: No  . Sexual activity: Yes   Other Topics Concern  . Not on file   Social History Narrative   Married 1964     Allergies  Allergen Reactions  . Lactose Intolerance (Gi)      Outpatient Medications Prior to Visit  Medication Sig Dispense Refill  . albuterol (PROVENTIL HFA;VENTOLIN HFA) 108 (90 Base) MCG/ACT inhaler Inhale 2 puffs into the lungs every 6 (six) hours as needed for wheezing or shortness of breath. 1 Inhaler 3  . amLODipine (NORVASC) 2.5 MG tablet Take 1 tablet (2.5 mg total) by mouth 2 (two) times daily. 180 tablet 3  . aspirin 81 MG tablet Take one tablet every other day.    Marland Kitchen atorvastatin (LIPITOR) 20 MG tablet Take 1 tablet (20 mg total) by mouth at bedtime. 90 tablet 3  . fluticasone (FLONASE) 50 MCG/ACT nasal spray USE TWO SPRAYS IN EACH NOSTRIL DAILY 16 g 11  . fluticasone (FLOVENT HFA)  110 MCG/ACT inhaler INHALE ONE PUFF BY MOUTH TWICE DAILY. RINSE MOUTH AFTER USE.. 12 g 3  . guaiFENesin (MUCINEX) 600 MG 12 hr tablet Take by mouth 2 (two) times daily as needed.    . meclizine (ANTIVERT) 25 MG tablet Take 1 tablet (25 mg total) by mouth 3 (three) times daily as needed. 90 tablet 3  . Multiple Vitamin (MULITIVITAMIN WITH MINERALS) TABS Take 1 tablet by mouth daily.    . Multiple Vitamins-Minerals (MACULAR VITAMIN BENEFIT) TABS Take 1 tablet by mouth daily. Lutein 25 mg and Zeaxanthin 5 mg    . nebivolol (BYSTOLIC) 10 MG tablet Take 1 tablet (10 mg total) by mouth 2 (two) times daily. 180 tablet 3  . promethazine (PHENERGAN) 25 MG tablet Take 1 tablet (25 mg total) by mouth every 6 (six) hours as needed for nausea or vomiting. 30 tablet 3   No facility-administered medications prior to visit.       Review of Systems  Constitutional: Negative for fever and unexpected weight change.  HENT: Negative for congestion, dental problem, ear pain, nosebleeds, postnasal drip, rhinorrhea, sinus pressure, sneezing, sore throat and trouble swallowing.   Eyes: Positive for redness. Negative for itching.  Respiratory: Negative for cough, chest tightness, shortness of breath and wheezing.   Cardiovascular: Negative for palpitations and leg swelling.  Gastrointestinal: Negative for nausea and vomiting.  Genitourinary: Negative for dysuria.  Musculoskeletal: Negative for joint swelling.  Skin: Negative for rash.  Neurological: Positive for headaches.  Hematological: Does not bruise/bleed easily.  Psychiatric/Behavioral: Negative for dysphoric mood. The patient is not nervous/anxious.        Objective:   Physical Exam Vitals:   10/04/16 1432  BP: 132/76  Pulse: 86  SpO2: 96%  Weight: 169 lb 3.2 oz (76.7 kg)  Height: 6' (1.829 m)  RA  Gen: well appearing, no acute distress HENT: NCAT, OP clear, neck supple without masses Eyes: PERRL, EOMi Lymph: no cervical  lymphadenopathy PULM: Crackles left base B CV: RRR, systolic murmur, no JVD GI: BS+, soft, nontender, no hsm Derm: no rash or skin breakdown MSK: normal bulk and tone Neuro: A&Ox4, CN II-XII intact, strength 5/5 in all 4 extremities Psyche: normal mood and affect    January 2018 hospital discharge  summary reviewed, he was admitted for community-acquired pneumonia which was listed as multifocal by the hospitalist. Discharged on home oxygen. He was noted to be positive for influenza A and treated with Tamiflu  Echo 10/2015 LVEF 45-50%,   Imaging: January 2017 CT angiogram chest images personally reviewed showing upper lobe centrilobular emphysema, patchy consolidation bilaterally in a bronchovascular distribution, questionable bronchiectasis. Some mediastinal lymphadenopathy noted.  BMET    Component Value Date/Time   NA 141 09/06/2016 1437   K 4.2 09/06/2016 1437   CL 103 09/06/2016 1437   CO2 33 (H) 09/06/2016 1437   GLUCOSE 119 (H) 09/06/2016 1437   BUN 16 09/06/2016 1437   CREATININE 0.85 09/06/2016 1437   CALCIUM 9.6 09/06/2016 1437   GFRNONAA >60 08/27/2016 0417   GFRAA >60 08/27/2016 0417        Assessment & Plan:  Centrilobular emphysema (HCC) Considering his prior smoking history and recent hospitalization for acute respiratory failure with hypoxemia during which time he was found to have centrilobular emphysema, will perform spirometry testing today to evaluate for COPD. Because he had minimal symptoms prior to his episode of pneumonia hold off on changing medications for now.  Allergic rhinitis This has been worse recently. He was not taking his medicines appropriately.  Plan: Start Flonase, counseled on the importance of using it routinely  Acute respiratory failure with hypoxia ([place]) Recently hospitalized for acute respiratory failure with hypoxemia secondary to pneumonia. We will do ambulatory oximetry monitoring today.  Pneumonia of both lower lobes due to  infectious organism He will need a follow-up CT scan. He happens to have one ordered for surveillance of his aortic disease in March 2018. We'll plan on requesting these images when he comes back in April 2018 per    Current Outpatient Prescriptions:  .  albuterol (PROVENTIL HFA;VENTOLIN HFA) 108 (90 Base) MCG/ACT inhaler, Inhale 2 puffs into the lungs every 6 (six) hours as needed for wheezing or shortness of breath., Disp: 1 Inhaler, Rfl: 3 .  amLODipine (NORVASC) 2.5 MG tablet, Take 1 tablet (2.5 mg total) by mouth 2 (two) times daily., Disp: 180 tablet, Rfl: 3 .  aspirin 81 MG tablet, Take one tablet every other day., Disp: , Rfl:  .  atorvastatin (LIPITOR) 20 MG tablet, Take 1 tablet (20 mg total) by mouth at bedtime., Disp: 90 tablet, Rfl: 3 .  fluticasone (FLONASE) 50 MCG/ACT nasal spray, USE TWO SPRAYS IN EACH NOSTRIL DAILY, Disp: 16 g, Rfl: 11 .  fluticasone (FLOVENT HFA) 110 MCG/ACT inhaler, INHALE ONE PUFF BY MOUTH TWICE DAILY. RINSE MOUTH AFTER USE.., Disp: 12 g, Rfl: 3 .  guaiFENesin (MUCINEX) 600 MG 12 hr tablet, Take by mouth 2 (two) times daily as needed., Disp: , Rfl:  .  meclizine (ANTIVERT) 25 MG tablet, Take 1 tablet (25 mg total) by mouth 3 (three) times daily as needed., Disp: 90 tablet, Rfl: 3 .  Multiple Vitamin (MULITIVITAMIN WITH MINERALS) TABS, Take 1 tablet by mouth daily., Disp: , Rfl:  .  Multiple Vitamins-Minerals (MACULAR VITAMIN BENEFIT) TABS, Take 1 tablet by mouth daily. Lutein 25 mg and Zeaxanthin 5 mg, Disp: , Rfl:  .  nebivolol (BYSTOLIC) 10 MG tablet, Take 1 tablet (10 mg total) by mouth 2 (two) times daily., Disp: 180 tablet, Rfl: 3 .  promethazine (PHENERGAN) 25 MG tablet, Take 1 tablet (25 mg total) by mouth every 6 (six) hours as needed for nausea or vomiting., Disp: 30 tablet, Rfl: 3

## 2016-10-04 NOTE — Assessment & Plan Note (Signed)
Considering his prior smoking history and recent hospitalization for acute respiratory failure with hypoxemia during which time he was found to have centrilobular emphysema, will perform spirometry testing today to evaluate for COPD. Because he had minimal symptoms prior to his episode of pneumonia hold off on changing medications for now.

## 2016-10-04 NOTE — Assessment & Plan Note (Signed)
This has been worse recently. He was not taking his medicines appropriately.  Plan: Start Flonase, counseled on the importance of using it routinely

## 2016-10-04 NOTE — Patient Instructions (Signed)
We will arrange for a chest x-ray and call you with the results 1 week prior to your visit with [place] please call us so that we can request the images from your CT scan which will be performed at [facility] We will see you back in early April

## 2016-10-04 NOTE — Assessment & Plan Note (Signed)
He will need a follow-up CT scan. He happens to have one ordered for surveillance of his aortic disease in March 2018. We'll plan on requesting these images when he comes back in April 2018 per

## 2016-10-04 NOTE — Assessment & Plan Note (Signed)
Recently hospitalized for acute respiratory failure with hypoxemia secondary to pneumonia. We will do ambulatory oximetry monitoring today.

## 2016-10-23 ENCOUNTER — Other Ambulatory Visit: Payer: Self-pay | Admitting: Family Medicine

## 2016-11-04 ENCOUNTER — Telehealth: Payer: Self-pay | Admitting: Family Medicine

## 2016-11-04 NOTE — Telephone Encounter (Signed)
Please call the [facility] clinic:  Austin Miles, Lawernce Pitts, PA  [address] [place], [place] [address]  [phone]  [phone] (Fax)   Gretchen Portela, MD  [address] [place], [place] [address]  [phone]  [phone] (Fax)   Patient prev had PNA with lymphadenopathy noted on CT, this was in 08/2016 here in [place].    He had routine follow up CT for aorta at [facility] in 10/2016 and I need them to comment on the the presence of lymphadenopathy with the scan. The lymphadenopathy was likely reactive with PNA back in 08/2016, but I need comment and the current report doesn't say one way or the other.   Thanks.

## 2016-11-05 NOTE — Telephone Encounter (Signed)
Phone note routed to [facility] providers per Dr. Damita Dunnings.

## 2016-11-08 NOTE — Telephone Encounter (Signed)
Notes sent as instructed.

## 2016-11-11 NOTE — Telephone Encounter (Signed)
Per report, see scanned papers, pt will have a disc for side by side imaging at pulmonary appointment.  Have patient let me know if this doesn't happen.  Thanks.

## 2016-11-12 NOTE — Telephone Encounter (Signed)
Patient notified as instructed and verbalized understanding. Patient stated that he is to pick up a disc and take with him for his appointment.

## 2016-11-27 ENCOUNTER — Encounter: Payer: Self-pay | Admitting: Pulmonary Disease

## 2016-11-27 ENCOUNTER — Ambulatory Visit (INDEPENDENT_AMBULATORY_CARE_PROVIDER_SITE_OTHER): Payer: Medicare Other | Admitting: Pulmonary Disease

## 2016-11-27 DIAGNOSIS — J189 Pneumonia, unspecified organism: Secondary | ICD-10-CM

## 2016-11-27 DIAGNOSIS — J432 Centrilobular emphysema: Secondary | ICD-10-CM | POA: Diagnosis not present

## 2016-11-27 DIAGNOSIS — J181 Lobar pneumonia, unspecified organism: Secondary | ICD-10-CM | POA: Diagnosis not present

## 2016-11-27 NOTE — Assessment & Plan Note (Signed)
He has centrilobular emphysema which is mild but no signs or symptoms of COPD otherwise. We'll hold off on any further workup at this time unless he becomes symptomatic. I did counsel him today on the importance of good hand hygiene and keeping his immunizations.

## 2016-11-27 NOTE — Patient Instructions (Signed)
Keep your follow-up with the [facility] clinic for further chest imaging From my standpoint he need no further imaging for the nodule Stay active, practice good hand hygiene when out in public Gets her flu shot this fall Follow-up with me on an as needed basis

## 2016-11-27 NOTE — Progress Notes (Signed)
Subjective:    Patient ID: Ray Fowler, male    DOB: 1940-09-28, 76 y.o.   MRN: [id number]  Synopsis: Referred to [facility] for healthcare associated pneumonia in 2013. He did not follow-up after that visit. He previously smoked one pack of cigarettes daily for many years (he is not sure how many) and quit in 1995. He returned in 2018 after a bout of pneumonia in the setting of influenza. He was hospitalized and discharged on exertional oxygen.  HPI Chief Complaint  Patient presents with  . Follow-up    pt doing well, denies breathing complaints at this time.    He has been feeling fine. He has been back to [facility].  His breathing is OK, no cough, no congestion.  He has some sinuc congestion and such.    Past Medical History:  Diagnosis Date  . Allergic rhinitis   . 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 BCC (basal cell carcinoma of skin) 2014  . Carotid bruit    Right. Carotid u/s 2/11 normal.  . Diverticulosis   . HTN (hypertension)   . Hyperlipidemia   . Pneumonia   . Prostate cancer ([place])   . Pulmonary nodule    Stable on CT 2/11  . Renal stones   . S/P CABG x 1   . Skin cancer    scalp 2007         Review of Systems  Constitutional: Negative for fever and unexpected weight change.  HENT: Negative for congestion, dental problem, ear pain, nosebleeds, postnasal drip, rhinorrhea, sinus pressure, sneezing, sore throat and trouble swallowing.   Eyes: Positive for redness. Negative for itching.  Respiratory: Negative for cough, chest tightness, shortness of breath and wheezing.   Cardiovascular: Negative for palpitations and leg swelling.  Gastrointestinal: Negative for nausea and vomiting.  Genitourinary: Negative for dysuria.  Musculoskeletal: Negative for joint swelling.  Skin: Negative for rash.  Neurological: Positive for headaches.  Hematological: Does not bruise/bleed easily.  Psychiatric/Behavioral:  Negative for dysphoric mood. The patient is not nervous/anxious.        Objective:   Physical Exam Vitals:   11/27/16 1402  BP: (!) 146/74  Pulse: 71  SpO2: 96%  Weight: 172 lb (78 kg)  Height: 6' (1.829 m)  RA  Gen: well appearing HENT: OP clear, TM's clear, neck supple PULM: CTA B, normal percussion CV: RRR, no mgr, trace edema GI: BS+, soft, nontender Derm: no cyanosis or rash Psyche: normal mood and affect   January 2018 hospital discharge summary reviewed, he was admitted for community-acquired pneumonia which was listed as multifocal by the hospitalist. Discharged on home oxygen. He was noted to be positive for influenza A and treated with Tamiflu  Echo 10/2015 LVEF 45-50%,   Imaging: January 2017 CT angiogram chest images personally reviewed showing upper lobe centrilobular emphysema, patchy consolidation bilaterally in a bronchovascular distribution, questionable bronchiectasis. Some mediastinal lymphadenopathy noted. March 2018 CT chest> 42mm nodule in RML unchanged Images independently reviewed with the patient today in clinic.  BMET    Component Value Date/Time   NA 141 09/06/2016 1437   K 4.2 09/06/2016 1437   CL 103 09/06/2016 1437   CO2 33 (H) 09/06/2016 1437   GLUCOSE 119 (H) 09/06/2016 1437   BUN 16 09/06/2016 1437   CREATININE 0.85 09/06/2016 1437   CALCIUM 9.6 09/06/2016 1437   GFRNONAA >60 08/27/2016 0417   GFRAA >60 08/27/2016 6010  Assessment & Plan:  Pneumonia of both lower lobes due to infectious organism This has resolved radiographically. He has a persistent pulmonary nodule in the right middle lobe which has been stable since 2016. No further imaging necessary at this time.  Centrilobular emphysema ([place]) He has centrilobular emphysema which is mild but no signs or symptoms of COPD otherwise. We'll hold off on any further workup at this time unless he becomes symptomatic. I did counsel him today on the importance of good hand hygiene  and keeping his immunizations.    Current Outpatient Prescriptions:  .  albuterol (PROVENTIL HFA;VENTOLIN HFA) 108 (90 Base) MCG/ACT inhaler, Inhale 2 puffs into the lungs every 6 (six) hours as needed for wheezing or shortness of breath., Disp: 1 Inhaler, Rfl: 3 .  amLODipine (NORVASC) 2.5 MG tablet, Take 1 tablet (2.5 mg total) by mouth 2 (two) times daily., Disp: 180 tablet, Rfl: 3 .  aspirin 81 MG tablet, Take one tablet every other day., Disp: , Rfl:  .  atorvastatin (LIPITOR) 20 MG tablet, Take 1 tablet (20 mg total) by mouth at bedtime., Disp: 90 tablet, Rfl: 3 .  FLOVENT HFA 110 MCG/ACT inhaler, INHALE ONE PUFF BY MOUTH TWICE DAILY. RINSE MOUTH AFTER USE.., Disp: 12 Inhaler, Rfl: 2 .  fluticasone (FLONASE) 50 MCG/ACT nasal spray, USE TWO SPRAYS IN EACH NOSTRIL DAILY, Disp: 16 g, Rfl: 11 .  guaiFENesin (MUCINEX) 600 MG 12 hr tablet, Take by mouth 2 (two) times daily as needed., Disp: , Rfl:  .  meclizine (ANTIVERT) 25 MG tablet, Take 1 tablet (25 mg total) by mouth 3 (three) times daily as needed., Disp: 90 tablet, Rfl: 3 .  Multiple Vitamin (MULITIVITAMIN WITH MINERALS) TABS, Take 1 tablet by mouth daily., Disp: , Rfl:  .  Multiple Vitamins-Minerals (MACULAR VITAMIN BENEFIT) TABS, Take 1 tablet by mouth daily. Lutein 25 mg and Zeaxanthin 5 mg, Disp: , Rfl:  .  nebivolol (BYSTOLIC) 10 MG tablet, Take 1 tablet (10 mg total) by mouth 2 (two) times daily., Disp: 180 tablet, Rfl: 3 .  promethazine (PHENERGAN) 25 MG tablet, Take 1 tablet (25 mg total) by mouth every 6 (six) hours as needed for nausea or vomiting., Disp: 30 tablet, Rfl: 3

## 2016-11-27 NOTE — Assessment & Plan Note (Signed)
This has resolved radiographically. He has a persistent pulmonary nodule in the right middle lobe which has been stable since 2016. No further imaging necessary at this time.

## 2016-11-28 ENCOUNTER — Telehealth: Payer: Self-pay | Admitting: Family Medicine

## 2016-11-28 NOTE — Telephone Encounter (Signed)
[employer] delivered per Mariann Laster to your office.

## 2016-11-28 NOTE — Telephone Encounter (Signed)
Ray Fowler Self [phone]  [employer] dropped off 2-DVD's for review- put on cart

## 2016-11-29 NOTE — Telephone Encounter (Signed)
I will review the material when possible. Thanks.

## 2017-01-25 ENCOUNTER — Other Ambulatory Visit: Payer: Self-pay | Admitting: Family Medicine

## 2017-01-25 ENCOUNTER — Encounter: Payer: Self-pay | Admitting: Cardiovascular Disease

## 2017-03-08 IMAGING — DX DG CHEST 2V
2 series · 2 of 2 positions shown · non-contrast
Comparison: Chest radiograph August 22, 2016 and chest CT August 26, 2016

CLINICAL DATA: Recent pneumonia

EXAM:
CHEST  2 VIEW

[chest pa]
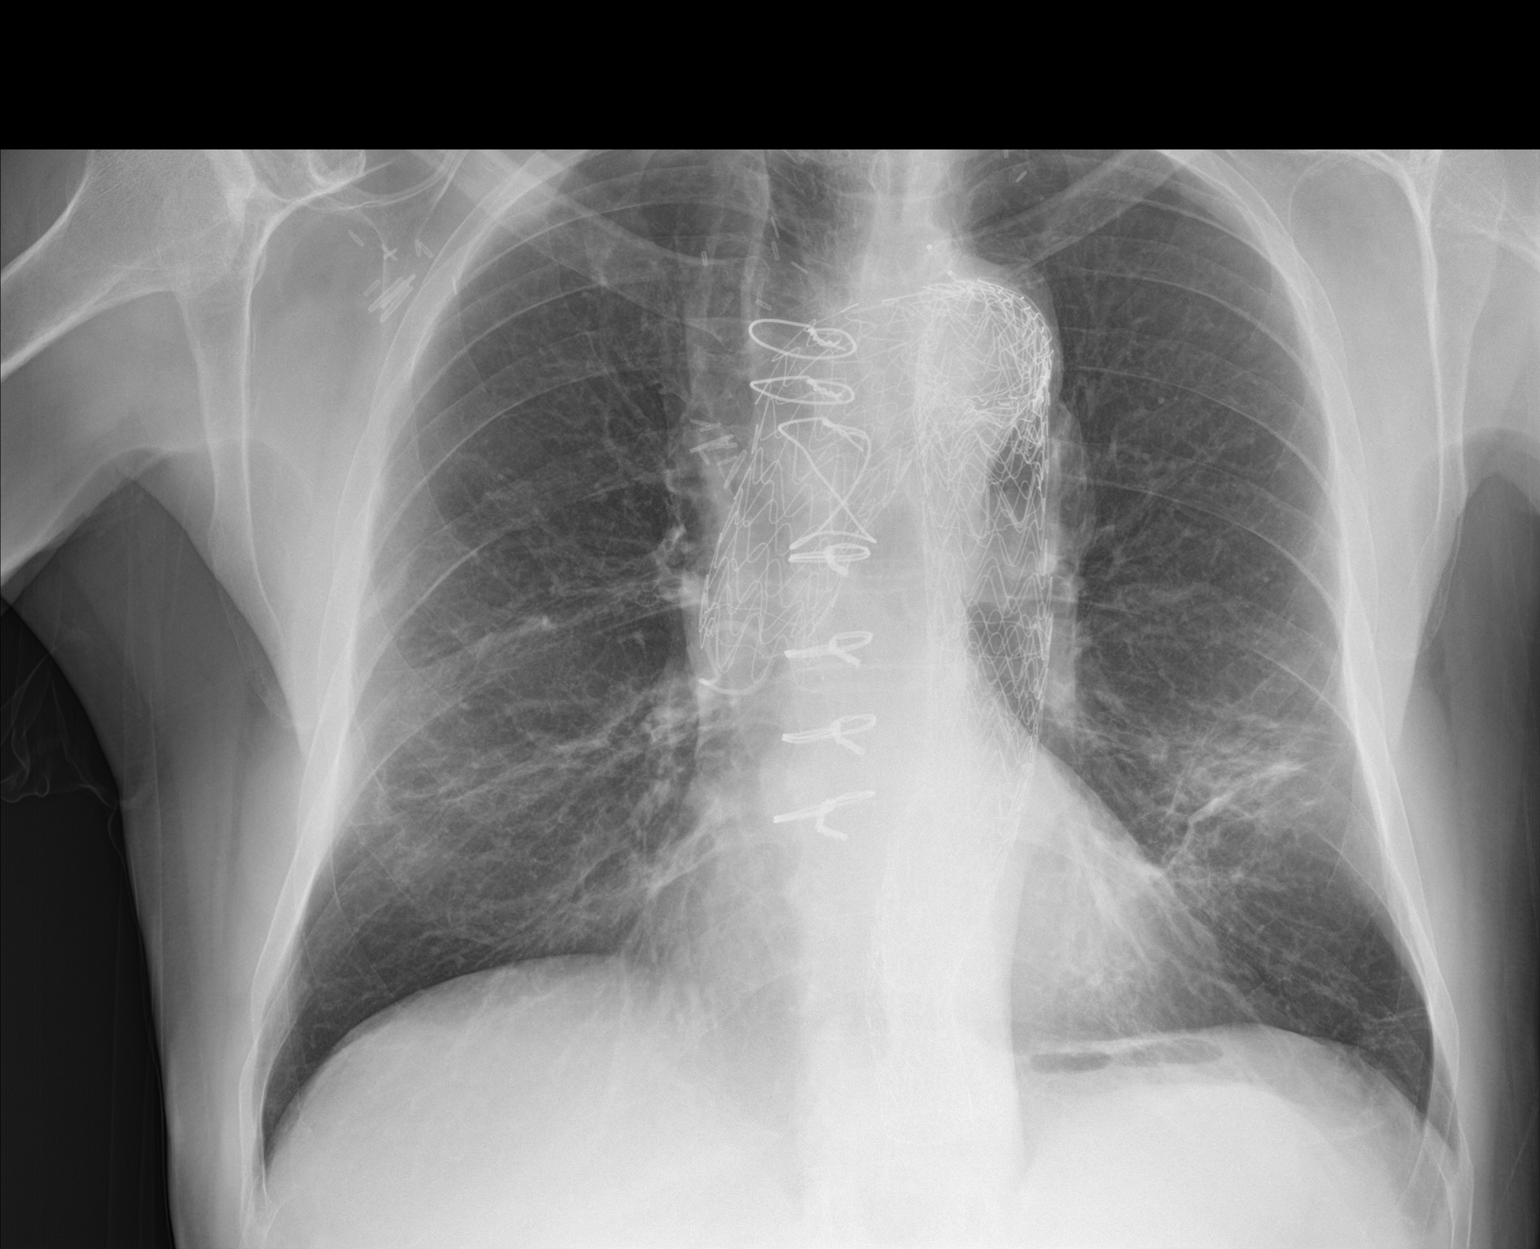

[chest lat]
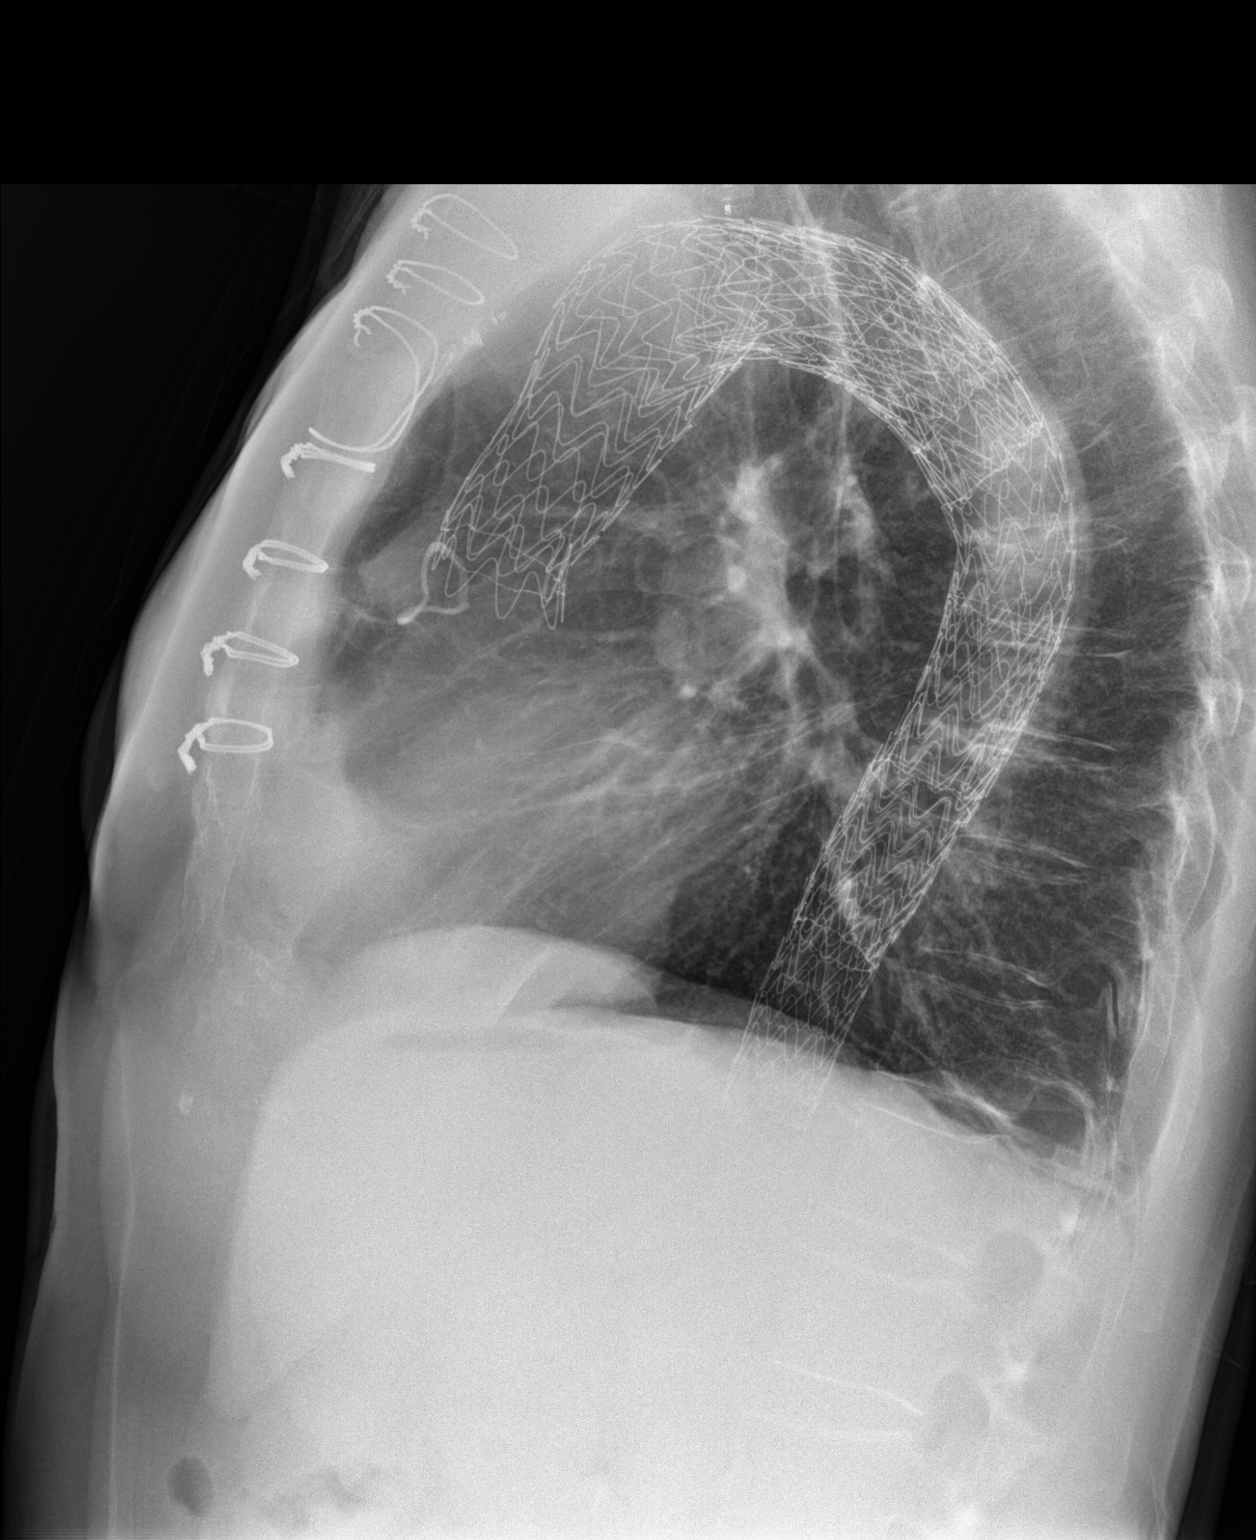

[2 of 2 positions shown; findings below may reference images not displayed]

FINDINGS: There has been interval clearing of infiltrate from the right mid
lower lung zones. There is patchy infiltrate in the left lower lobe
which is persistent. No new opacity identified.

Heart size and pulmonary vascularity are normal. There is extensive
stent placement in the thoracic aorta, unchanged. There are surgical
clips in the left apex as well as right axillary region. No
adenopathy. No bone lesions.
IMPRESSION: Interval clearing of infiltrate on the right. Persistent patchy
infiltrate most likely due to pneumonia left lower lobe. No new
areas of opacity. Stable cardiac silhouette.

Given persistence of pneumonia in the left lower lobe, continued
surveillance is felt to be warranted.

## 2017-03-18 IMAGING — DX DG CHEST 2V
2 series · 2 of 2 positions shown · non-contrast
Comparison: Chest x-ray of [DATE]

CLINICAL DATA: Followup of pneumonia, history of repair of aortic
arch aneurysm, former smoking history

EXAM:
CHEST  2 VIEW

[chest pa]
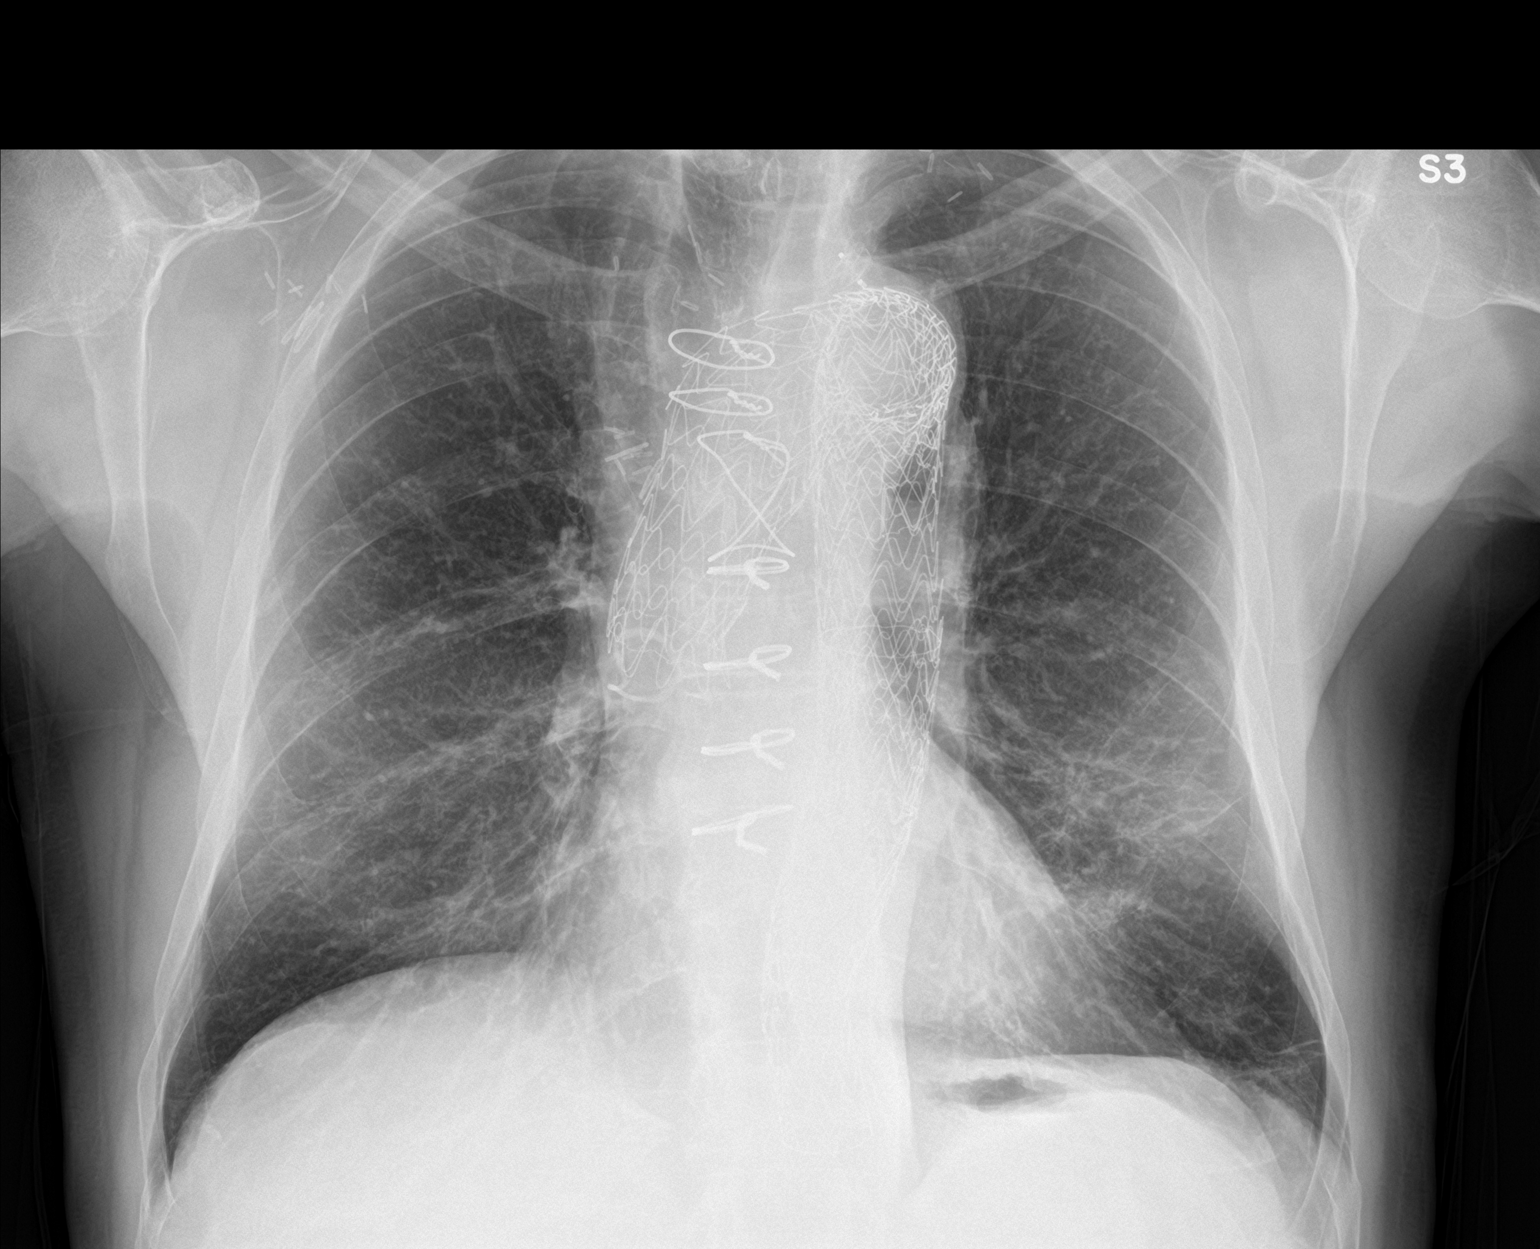

[chest lat]
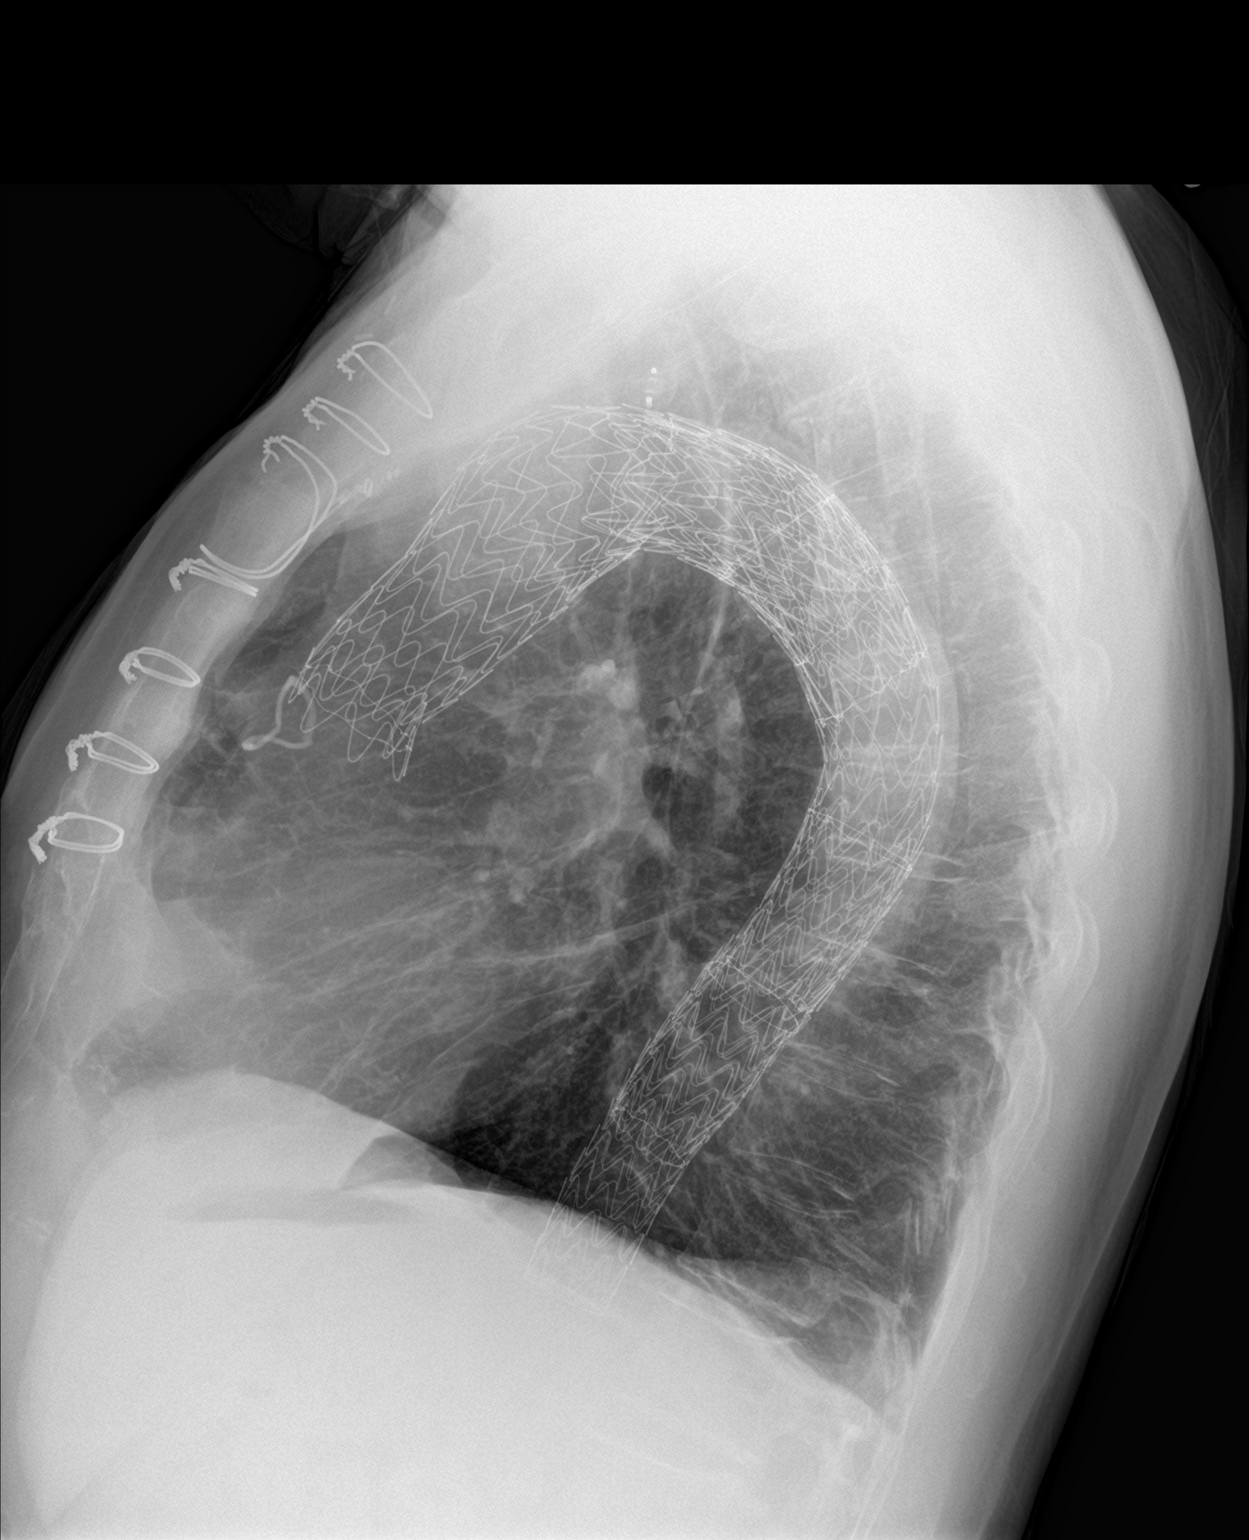

[2 of 2 positions shown; findings below may reference images not displayed]

FINDINGS: The lungs remain somewhat hyperaerated. Minimal linear opacity
remains at the left lung base most consistent with scarring and/or
mild atelectasis. No pleural effusion is seen. Mediastinal and hilar
contours are unremarkable. The endovascular stent repair of the
ascending and descending thoracic aorta appears stable. No acute
bony abnormality is seen. Median sternotomy sutures are noted.
IMPRESSION: 1. No definite residual pneumonia. Minimal linear atelectasis or
scarring at the left lung base.
2. Stable endovascular stent repair of the ascending and descending
thoracic aorta.

## 2017-03-28 ENCOUNTER — Other Ambulatory Visit: Payer: Self-pay | Admitting: Cardiovascular Disease

## 2017-04-02 NOTE — Progress Notes (Deleted)
Cardiology Office Note  Date:  04/02/2017   ID:  Dravin, Lance 04-17-1941, MRN [id number]  PCP:  Tonia Ghent, MD   No chief complaint on file.   HPI:  Ray Fowler is a 76 y/o male with h/o  HTN,  hyperlipidemia,  Type A aortic dissection in 1/09 complicated by cardiac tamponade,  CT showed extension of the dissection from the aortic root all the way down to the femoral arteries,  s/p aortic root replacement of hemiarch repair and aortic valve resuspension.  Post-op course complicated by persistent HTN and atrial flutter treated with amio (stopped in January 2011).  followup with cardiothoracic surgery at [facility] on an annual basis with CT scan surveillance. CT September 2014 showing remodeling of his thoracic aorta, overall looked good/stable. He presents for routine followup of his of his hypertension And aortic dissection  In follow-up today he reports that he is doing relatively well Last CT 04/2015 at [facility] Scheduled for repeat CT scan in late 2018  In hospital 08/21/2016 to 1/15 Had flu and PNA 08/2016 CT 08/26/2016 at [place] recovering, scant cough  Sinus headaches past week Taking over-the-counter medication Reports having palpitations  Blood pressure typically well controlled at home  Tolerating Lipitor 20 mg daily Previous CT scan from [facility] hospital September 2016, stable disease  subclavian atherosclerosis, as well as other places, mild.  stable aneurysm  EKG on today's visit shows normal sinus rhythm with rate 75 bpm, nonspecific ST abnormality, PVCs   Other past medical history Prior ECHO showed EF 45-50%. Had cardiac CT in 12/10 after abnormal myvoiew. Coronaries normal except for 25% RCA. 45mm stable pulmonary nodule.  echo (02/2010) EF 55-60% with grade 1 diastolic dysfx and previous AO dissection flap.  pneumonia in November 2012 with chest x-ray showing dilated aorta. 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 he was referred to [facility] for surgery. He had an arch branching procedure involving neo-ascending aorta to the right common carotid and right subclavian arteries individually, and branch to the left common carotid artery, concomittant left carotid to the subclavian artery bypass in the neck with endovascular exclusion of the aneurysm with endograft to extend from the hemi-arch back autograft down to the distal descending thoracic aorta.  PMH:   has a past medical history of Allergic rhinitis; Aortic arch aneurysm ([place]) (10/2011); Aortic dissection ([place]) (2010); BCC (basal cell carcinoma of skin) (2014); Carotid bruit; Diverticulosis; HTN (hypertension); Hyperlipidemia; Pneumonia; Prostate cancer ([place]); Pulmonary nodule; Renal stones; S/P CABG x 1; and Skin cancer.  PSH:    Past Surgical History:  Procedure Laterality Date  . ABDOMINAL AORTIC ANEURYSM REPAIR  march 2013  . 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 CARDIAC SURGERY  2013   at [facility]- transverse aortic arch/descending aneurysm repair s/p debranching procedure  (total of 5 Gore C-TAG devices extending from distal descending aorta to his prev placed ascending dacron graft)  . COLONOSCOPY    . Flex- Polyp   06/1999   at 38cm, Int/Ext Hemm divertics [facility])  . Hemi Arch Replacement  03/2009 and repeat 10/2011   and AoV resuspension w/ Repl of Ascending Aorta and Hemiarch w/ Graft; Pericardial Tamponade Aortic Regurg 8/22-8/28/10;  endovascular repair of aortic dilation 2013 with  ascending aorta to L common carotid, R subclavian, R common carotid and L carotid subclavian bypass  . [facility]  08/22-8/28/2010   Thoracic aortic dissection pericardial teamponade aortic regurg  .  PENILE PROSTHESIS IMPLANT  1996  . POLYPECTOMY    . PROSTATECTOMY  12/1993   Dr. Risa Grill    Current Outpatient Prescriptions  Medication Sig Dispense Refill  . albuterol (PROVENTIL HFA;VENTOLIN HFA) 108 (90 Base) MCG/ACT inhaler  Inhale 2 puffs into the lungs every 6 (six) hours as needed for wheezing or shortness of breath. 1 Inhaler 3  . amLODipine (NORVASC) 2.5 MG tablet TAKE 1 TABLET (2.5 MG TOTAL) BY MOUTH 2 (TWO) TIMES DAILY. 180 tablet 3  . aspirin 81 MG tablet Take one tablet every other day.    Marland Kitchen atorvastatin (LIPITOR) 20 MG tablet Take 1 tablet (20 mg total) by mouth at bedtime. 90 tablet 3  . FLOVENT HFA 110 MCG/ACT inhaler INHALE ONE PUFF BY MOUTH TWICE DAILY. RINSE MOUTH AFTER USE.. 12 Inhaler 2  . fluticasone (FLONASE) 50 MCG/ACT nasal spray SPRAY TWO SPRAYS IN EACH NOSTRIL DAILY 16 g 4  . guaiFENesin (MUCINEX) 600 MG 12 hr tablet Take by mouth 2 (two) times daily as needed.    . meclizine (ANTIVERT) 25 MG tablet Take 1 tablet (25 mg total) by mouth 3 (three) times daily as needed. 90 tablet 3  . Multiple Vitamin (MULITIVITAMIN WITH MINERALS) TABS Take 1 tablet by mouth daily.    . Multiple Vitamins-Minerals (MACULAR VITAMIN BENEFIT) TABS Take 1 tablet by mouth daily. Lutein 25 mg and Zeaxanthin 5 mg    . nebivolol (BYSTOLIC) 10 MG tablet Take 1 tablet (10 mg total) by mouth 2 (two) times daily. 180 tablet 3  . promethazine (PHENERGAN) 25 MG tablet Take 1 tablet (25 mg total) by mouth every 6 (six) hours as needed for nausea or vomiting. 30 tablet 3   No current facility-administered medications for this visit.      Allergies:   Lactose intolerance (gi)   Social History:  The patient  reports that he quit smoking about 22 years ago. His smoking use included Cigarettes. He has a 25.00 pack-year smoking history. He has never used smokeless tobacco. He reports that he drinks about 0.6 oz of alcohol per week . He reports that he does not use drugs.   Family History:   family history includes Diabetes in his other and other; Heart disease in his father and mother; Heart failure in his mother; Hyperlipidemia in his daughter; Hypertension in his mother; Kidney disease in his father; Kidney failure in his father;  Stroke in his father.    Review of Systems: Review of Systems  Constitutional: Negative.   Respiratory: Positive for cough.   Cardiovascular: Positive for palpitations.  Gastrointestinal: Negative.   Musculoskeletal: Negative.   Neurological: Negative.   Psychiatric/Behavioral: Negative.   All other systems reviewed and are negative.    PHYSICAL EXAM: VS:  There were no vitals taken for this visit. , BMI There is no height or weight on file to calculate BMI. GEN: Well nourished, well developed, in no acute distress  HEENT: normal  Neck: no JVD, carotid bruits, or masses Cardiac: RRR; ectopy noted, no murmurs, rubs, or gallops,no edema  Respiratory:  clear to auscultation bilaterally, normal work of breathing GI: soft, nontender, nondistended, + BS MS: no deformity or atrophy  Skin: warm and dry, no rash Neuro:  Strength and sensation are intact Psych: euthymic mood, full affect    Recent Labs: 08/21/2016: B Natriuretic Peptide 252.3 08/26/2016: Magnesium 1.9 09/06/2016: ALT 37; BUN 16; Creatinine, Ser 0.85; Hemoglobin 14.7; Platelets 219.0; Potassium 4.2; Sodium 141    Lipid Panel Lab Results  Component Value Date   CHOL 129 08/15/2016   HDL 36.00 (L) 08/15/2016   LDLCALC 68 08/15/2016   TRIG 126.0 08/15/2016      Wt Readings from Last 3 Encounters:  11/27/16 172 lb (78 kg)  10/04/16 169 lb 3.2 oz (76.7 kg)  10/03/16 168 lb 4 oz (76.3 kg)       ASSESSMENT AND PLAN:  Essential hypertension - Plan: EKG 12-Lead Blood pressure is well controlled on today's visit. No changes made to the medications.  Coronary artery disease involving native coronary artery of native heart without angina pectoris Currently with no symptoms of angina. No further workup at this time. Continue current medication regimen. Cholesterol is at goal on the current lipid regimen.   Frequent PVCs Frequent PVCs on EKG today Likely in the setting of sinus infection, recovering from  pneumonia/flu Recommended that he could increase his beta blocker as needed  Dissection of thoracic aorta ([place]), H/O ascending aorta repair Followed at [facility], scant every 18 month  Acute respiratory failure with hypoxia ([place]) Recovered from recent pneumonia, flu   Total encounter time more than 25 minutes  Greater than 50% was spent in counseling and coordination of care with the patient   Disposition:   F/U  12 months   No orders of the defined types were placed in this encounter.    Signed, Esmond Plants, M.D., Ph.D. 04/02/2017  [place], [place]

## 2017-04-03 ENCOUNTER — Ambulatory Visit (INDEPENDENT_AMBULATORY_CARE_PROVIDER_SITE_OTHER): Payer: Medicare Other | Admitting: Cardiovascular Disease

## 2017-04-03 ENCOUNTER — Encounter: Payer: Self-pay | Admitting: Cardiovascular Disease

## 2017-04-03 ENCOUNTER — Ambulatory Visit: Payer: Medicare Other | Admitting: Cardiovascular Disease

## 2017-04-03 VITALS — BP 160/90 | HR 62 | Ht 72.0 in | Wt 172.5 lb

## 2017-04-03 DIAGNOSIS — I4892 Unspecified atrial flutter: Secondary | ICD-10-CM | POA: Diagnosis not present

## 2017-04-03 DIAGNOSIS — I25118 Atherosclerotic heart disease of native coronary artery with other forms of angina pectoris: Secondary | ICD-10-CM | POA: Diagnosis not present

## 2017-04-03 DIAGNOSIS — I429 Cardiomyopathy, unspecified: Secondary | ICD-10-CM

## 2017-04-03 DIAGNOSIS — E785 Hyperlipidemia, unspecified: Secondary | ICD-10-CM

## 2017-04-03 MED ORDER — HYDROCHLOROTHIAZIDE 25 MG PO TABS
25.0000 mg | ORAL_TABLET | Freq: Every day | ORAL | 3 refills | Status: DC
Start: 2017-04-03 — End: 2017-08-27

## 2017-04-03 NOTE — Progress Notes (Signed)
Cardiology Office Note  Date:  04/03/2017   ID:  Story, Vanvranken 1941/06/01, MRN [id number]  PCP:  Tonia Ghent, MD   Chief Complaint  Patient presents with  . other    6 month follow up. Patient c/o rash from amlodipine. Meds reviewed verbally with patient.     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 Post-op course complicated by persistent HTN and atrial flutter treated with amio (stopped in January 2011). He has followup with cardiothoracic surgery at [facility] on an annual basis with CT scan surveillance. CT September 2014 showing remodeling of his thoracic aorta, overall looked good/stable. He presents for routine followup of his of his hypertension And aortic dissection  Recent CT scan results reviewed with him CT 10/2016 1.Stable postsurgical findings following endovascular repair of the thoracic aorta and debranching procedure. 2.Redemonstrated dissection of the abdominal aorta extending into the external iliac arteries bilaterally. The dissection flap extends into the SMA and left main renal artery. 3.A 2.1 cm low-attenuation focus at the posterior aspect of the right renal cortex does not measure simple fluid attenuation. This is unchanged in size/appearance relative to comparison exam (May 13, 2015). This does not demonstrate arterial phase enhancement on the current examination.  Blood pressure continues to run high at home He does not seem particularly concerned Bp at home 140 to 150/60, improves when he sits down He is most all the by the cost of bystolic. Currently taking 10 mg twice a day Has not been compliant with the amlodipine, concerned this could be causing side  effects such as rash on his legs  EKG on today's visit shows normal sinus rhythm with rate 62 bpm, nonspecific ST abnormality  In hospital 08/21/2016 to 1/15 Had flu and PNA 08/2016 CT 08/26/2016 at [facility]  Other past medical history Prior ECHO showed EF 45-50%. Had cardiac CT in 12/10 after abnormal myvoiew. Coronaries normal except for 25% RCA. 21mm stable pulmonary nodule.  echo (02/2010) EF 55-60% with grade 1 diastolic dysfx and previous AO dissection flap.  pneumonia in November 2012 with chest x-ray showing dilated aorta. 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 he was referred to [facility] for surgery. He had an arch branching procedure involving neo-ascending aorta to the right common carotid and right subclavian arteries individually, and branch to the left common carotid artery, concomittant left carotid to the subclavian artery bypass in the neck with endovascular exclusion of the aneurysm with endograft to extend from the hemi-arch back autograft down to the distal descending thoracic aorta.  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  PSH:    Past Surgical History:  Procedure Laterality Date  . ABDOMINAL AORTIC ANEURYSM REPAIR  march 2013  . 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 CARDIAC SURGERY  2013   at [facility]- transverse aortic arch/descending aneurysm repair s/p debranching procedure  (total of 5 Gore C-TAG devices extending from distal descending aorta to his prev  placed ascending dacron graft)  . COLONOSCOPY    . Flex- Polyp   06/1999   at 38cm, Int/Ext Hemm divertics [facility])  . Hemi Arch Replacement  03/2009 and repeat 10/2011   and AoV  resuspension w/ Repl of Ascending Aorta and Hemiarch w/ Graft; Pericardial Tamponade Aortic Regurg 8/22-8/28/10;  endovascular repair of aortic dilation 2013 with  ascending aorta to L common carotid, R subclavian, R common carotid and L carotid subclavian bypass  . [facility]  08/22-8/28/2010   Thoracic aortic dissection pericardial teamponade aortic regurg  . PENILE PROSTHESIS IMPLANT  1996  . POLYPECTOMY    . PROSTATECTOMY  12/1993   Dr. Risa Grill    Current Outpatient Prescriptions  Medication Sig Dispense Refill  . albuterol (PROVENTIL HFA;VENTOLIN HFA) 108 (90 Base) MCG/ACT inhaler Inhale 2 puffs into the lungs every 6 (six) hours as needed for wheezing or shortness of breath. 1 Inhaler 3  . amLODipine (NORVASC) 2.5 MG tablet TAKE 1 TABLET (2.5 MG TOTAL) BY MOUTH 2 (TWO) TIMES DAILY. 180 tablet 3  . aspirin 81 MG tablet Take one tablet every other day.    Marland Kitchen atorvastatin (LIPITOR) 20 MG tablet Take 1 tablet (20 mg total) by mouth at bedtime. 90 tablet 3  . FLOVENT HFA 110 MCG/ACT inhaler INHALE ONE PUFF BY MOUTH TWICE DAILY. RINSE MOUTH AFTER USE.. 12 Inhaler 2  . fluticasone (FLONASE) 50 MCG/ACT nasal spray SPRAY TWO SPRAYS IN EACH NOSTRIL DAILY 16 g 4  . guaiFENesin (MUCINEX) 600 MG 12 hr tablet Take by mouth 2 (two) times daily as needed.    . meclizine (ANTIVERT) 25 MG tablet Take 1 tablet (25 mg total) by mouth 3 (three) times daily as needed. 90 tablet 3  . Multiple Vitamin (MULITIVITAMIN WITH MINERALS) TABS Take 1 tablet by mouth daily.    . Multiple Vitamins-Minerals (MACULAR VITAMIN BENEFIT) TABS Take 1 tablet by mouth daily. Lutein 25 mg and Zeaxanthin 5 mg    . nebivolol (BYSTOLIC) 10 MG tablet Take 1 tablet (10 mg total) by mouth 2 (two) times daily. 180 tablet 3  . promethazine (PHENERGAN) 25 MG tablet Take 1 tablet (25 mg total) by mouth every 6 (six) hours as needed for nausea or vomiting. 30 tablet 3   No current facility-administered medications for this visit.       Allergies:   Lactose intolerance (gi)   Social History:  The patient  reports that he quit smoking about 22 years ago. His smoking use included Cigarettes. He has a 25.00 pack-year smoking history. He has never used smokeless tobacco. He reports that he drinks about 0.6 oz of alcohol per week . He reports that he does not use drugs.   Family History:   family history includes Diabetes in his other and other; Heart disease in his father and mother; Heart failure in his mother; Hyperlipidemia in his daughter; Hypertension in his mother; Kidney disease in his father; Kidney failure in his father; Stroke in his father.    Review of Systems: Review of Systems  Constitutional: Negative.   Cardiovascular: Negative.   Gastrointestinal: Negative.   Musculoskeletal: Negative.   Neurological: Negative.   Psychiatric/Behavioral: Negative.   All other systems reviewed and are negative.    PHYSICAL EXAM: VS:  BP (!) 160/90 (BP Location: Left Arm, Patient Position: Sitting, Cuff Size: Normal)   Pulse 62   Ht 6' (1.829 m)   Wt 172 lb 8 oz (78.2 kg)   BMI 23.40 kg/m   ,  BMI Body mass index is 23.4 kg/m. GEN: Well nourished, well developed, in no acute distress  HEENT: normal  Neck: no JVD, carotid bruits, or masses Cardiac: RRR; ectopy noted, no murmurs, rubs, or gallops,no edema  Respiratory:  clear to auscultation bilaterally, normal work of breathing GI: soft, nontender, nondistended, + BS MS: no deformity or atrophy  Skin: warm and dry, no rash Neuro:  Strength and sensation are intact Psych: euthymic mood, full affect    Recent Labs: 08/21/2016: B Natriuretic Peptide 252.3 08/26/2016: Magnesium 1.9 09/06/2016: ALT 37; BUN 16; Creatinine, Ser 0.85; Hemoglobin 14.7; Platelets 219.0; Potassium 4.2; Sodium 141    Lipid Panel Lab Results  Component Value Date   CHOL 129 08/15/2016   HDL 36.00 (L) 08/15/2016   LDLCALC 68 08/15/2016   TRIG 126.0 08/15/2016      Wt Readings  from Last 3 Encounters:  04/03/17 172 lb 8 oz (78.2 kg)  11/27/16 172 lb (78 kg)  10/04/16 169 lb 3.2 oz (76.7 kg)       ASSESSMENT AND PLAN:  Essential hypertension - Plan: EKG 12-Lead Does not like taking amlodipine, feels this is causing leg swelling Recommended he stay on bystolic 20 mg daily, samples provided  Suggested he try HCTZ daily or every other day   Coronary artery disease involving native coronary artery of native heart without angina pectoris Currently with no symptoms of angina. No further workup at this time. Continue current medication regimen. Cholesterol is at goal on the current lipid regimen.   Frequent PVCs Asymptomatic  Dissection of thoracic aorta ([place]), H/O ascending aorta repair Followed at [facility], scant every 18 month Reviewed recent CT scan with him in detail from March 2018    Total encounter time more than 25 minutes  Greater than 50% was spent in counseling and coordination of care with the patient   Disposition:   F/U  12 months   Orders Placed This Encounter  Procedures  . EKG 12-Lead     Signed, Esmond Plants, M.D., Ph.D. 04/03/2017  [place], [place]

## 2017-04-03 NOTE — Patient Instructions (Signed)
Medication Instructions:   Try the bystolic 20 mg once a day Play with the HCTZ every other day Play with the amlodipine  Labwork:  No new labs needed  Testing/Procedures:  No further testing at this time   Follow-Up: It was a pleasure seeing you in the office today. Please call us if you have new issues that need to be addressed before your next appt.  [phone]  Your physician wants you to follow-up in: 12 months.  You will receive a reminder letter in the mail two months in advance. If you don't receive a letter, please call our office to schedule the follow-up appointment.  If you need a refill on your cardiac medications before your next appointment, please call your pharmacy.

## 2017-05-11 ENCOUNTER — Other Ambulatory Visit: Payer: Self-pay | Admitting: Cardiovascular Disease

## 2017-05-28 ENCOUNTER — Ambulatory Visit (INDEPENDENT_AMBULATORY_CARE_PROVIDER_SITE_OTHER): Payer: Medicare Other

## 2017-05-28 DIAGNOSIS — Z23 Encounter for immunization: Secondary | ICD-10-CM

## 2017-06-29 ENCOUNTER — Other Ambulatory Visit: Payer: Self-pay | Admitting: Cardiovascular Disease

## 2017-08-05 ENCOUNTER — Ambulatory Visit (INDEPENDENT_AMBULATORY_CARE_PROVIDER_SITE_OTHER)
Admission: RE | Admit: 2017-08-05 | Discharge: 2017-08-05 | Disposition: A | Discharge status: Home / Self Care | Payer: Medicare Other | Source: Ambulatory Visit | Attending: Family Medicine | Admitting: Family Medicine

## 2017-08-05 ENCOUNTER — Encounter: Payer: Self-pay | Admitting: Family Medicine

## 2017-08-05 ENCOUNTER — Ambulatory Visit: Payer: Medicare Other | Admitting: Family Medicine

## 2017-08-05 VITALS — BP 130/64 | HR 65 | Temp 98.2°F | Wt 168.0 lb

## 2017-08-05 DIAGNOSIS — R05 Cough: Secondary | ICD-10-CM | POA: Diagnosis not present

## 2017-08-05 DIAGNOSIS — R059 Cough, unspecified: Secondary | ICD-10-CM

## 2017-08-05 DIAGNOSIS — J432 Centrilobular emphysema: Secondary | ICD-10-CM | POA: Diagnosis not present

## 2017-08-05 MED ORDER — AZITHROMYCIN 250 MG PO TABS: ORAL_TABLET | ORAL | 0 refills | Status: DC

## 2017-08-05 NOTE — Assessment & Plan Note (Signed)
Cough with lung findings - but CXR clear on my read. Treat as bacterial bronchitis with zpack antibiotic. Continue daily lflovent and albuterol PRN. Further supportive care as per instructions - continue mucinex. Anticipate allergic component to symptoms - encouraged restart flonase.  Update if not improving with treatment.

## 2017-08-05 NOTE — Progress Notes (Signed)
BP 130/64 (BP Location: Left Arm, Patient Position: Sitting, Cuff Size: Normal)   Pulse 65   Temp 98.2 F (36.8 C) (Oral)   Wt 168 lb (76.2 kg)   SpO2 94%   BMI 22.78 kg/m    CC: cough Subjective:    Patient ID: Ray Fowler, male    DOB: [birth year], 76 y.o.   MRN: [id number]  HPI: JASTIN FORE is a 76 y.o. male presenting on 08/05/2017 for Cough (dry, worse at night. Feels like tickle at back of throat. Started 07/31/17. Taking Mucinex but none today. Thinks may be brochonitis)   2 wk h/o head congestion, 5d h/o scratchy throat, clear rhinorrhea which has improved with mucinex.  Started after raking leaves 2 wks ago.   Denies chest congestion, no significant cough, fevers/chills, ear or tooth pain, headache, pressure.   Treating with mucinex and throat lozenges and cough drops without significant improvement. Flonase hasn't really helped. Has used afrin nasal spray.  No sick contacts at home.  No smokers at home. Known COPD emphysema - on flovent daily, ventolin PRN.   Hospitalized last year with flu/pneumonia.  H/o aortic dissection. Significant vascular disease.   Relevant past medical, surgical, family and social history reviewed and updated as indicated. Interim medical history since our last visit reviewed. Allergies and medications reviewed and updated. Outpatient Medications Prior to Visit  Medication Sig Dispense Refill  . albuterol (PROVENTIL HFA;VENTOLIN HFA) 108 (90 Base) MCG/ACT inhaler Inhale 2 puffs into the lungs every 6 (six) hours as needed for wheezing or shortness of breath. 1 Inhaler 3  . amLODipine (NORVASC) 2.5 MG tablet TAKE 1 TABLET (2.5 MG TOTAL) BY MOUTH 2 (TWO) TIMES DAILY. 180 tablet 3  . atorvastatin (LIPITOR) 20 MG tablet TAKE 1 TABLET (20 MG TOTAL) BY MOUTH AT BEDTIME. 90 tablet 3  . FLOVENT HFA 110 MCG/ACT inhaler INHALE ONE PUFF BY MOUTH TWICE DAILY. RINSE MOUTH AFTER USE.. 12 Inhaler 2  . fluticasone (FLONASE) 50 MCG/ACT nasal  spray SPRAY TWO SPRAYS IN EACH NOSTRIL DAILY 16 g 4  . guaiFENesin (MUCINEX) 600 MG 12 hr tablet Take by mouth 2 (two) times daily as needed.    . hydrochlorothiazide (HYDRODIURIL) 25 MG tablet Take 1 tablet (25 mg total) by mouth daily. 90 tablet 3  . meclizine (ANTIVERT) 25 MG tablet Take 1 tablet (25 mg total) by mouth 3 (three) times daily as needed. 90 tablet 3  . Multiple Vitamin (MULITIVITAMIN WITH MINERALS) TABS Take 1 tablet by mouth daily.    . Multiple Vitamins-Minerals (MACULAR VITAMIN BENEFIT) TABS Take 1 tablet by mouth daily. Lutein 25 mg and Zeaxanthin 5 mg    . nebivolol (BYSTOLIC) 10 MG tablet Take 2 tablets (20 mg total) daily by mouth. 180 tablet 2  . promethazine (PHENERGAN) 25 MG tablet Take 1 tablet (25 mg total) by mouth every 6 (six) hours as needed for nausea or vomiting. 30 tablet 3  . aspirin 81 MG tablet Take one tablet every other day.     No facility-administered medications prior to visit.      Per HPI unless specifically indicated in ROS section below Review of Systems     Objective:    BP 130/64 (BP Location: Left Arm, Patient Position: Sitting, Cuff Size: Normal)   Pulse 65   Temp 98.2 F (36.8 C) (Oral)   Wt 168 lb (76.2 kg)   SpO2 94%   BMI 22.78 kg/m   Wt Readings from Last 3 Encounters:  08/05/17 168 lb (76.2 kg)  04/03/17 172 lb 8 oz (78.2 kg)  11/27/16 172 lb (78 kg)    Physical Exam  Constitutional: He appears well-developed and well-nourished. No distress.  HENT:  Head: Normocephalic and atraumatic.  Right Ear: Hearing, tympanic membrane, external ear and ear canal normal.  Left Ear: Hearing, tympanic membrane, external ear and ear canal normal.  Nose: Mucosal edema (nasal mucosal erythema and congestion) present. No rhinorrhea. Right sinus exhibits no maxillary sinus tenderness and no frontal sinus tenderness. Left sinus exhibits no maxillary sinus tenderness and no frontal sinus tenderness.  Mouth/Throat: Uvula is midline and mucous  membranes are normal. Posterior oropharyngeal erythema present. No oropharyngeal exudate, posterior oropharyngeal edema or tonsillar abscesses.  Eyes: Conjunctivae and EOM are normal. Pupils are equal, round, and reactive to light. No scleral icterus.  Neck: Normal range of motion. Neck supple.  Cardiovascular: Normal rate, regular rhythm and intact distal pulses. Frequent extrasystoles are present.  Murmur (systolic) heard. Pulmonary/Chest: Effort normal. No respiratory distress. He has decreased breath sounds. He has no wheezes. He has rhonchi. He has rales (RLL faint).  Lymphadenopathy:    He has no cervical adenopathy.  Skin: Skin is warm and dry. No rash noted.  Nursing note and vitals reviewed.     Assessment & Plan:   Problem List Items Addressed This Visit    Centrilobular emphysema (HCC)   Relevant Medications   azithromycin (ZITHROMAX) 250 MG tablet   Cough - Primary    Cough with lung findings - but CXR clear on my read. Treat as bacterial bronchitis with zpack antibiotic. Continue daily lflovent and albuterol PRN. Further supportive care as per instructions - continue mucinex. Anticipate allergic component to symptoms - encouraged restart flonase.  Update if not improving with treatment.       Relevant Orders   DG Chest 2 View (Completed)       Follow up plan: Return if symptoms worsen or fail to improve.  Ria Bush, MD

## 2017-08-05 NOTE — Patient Instructions (Addendum)
Chest xray today - overall ok I do think you have a bronchitis - treat with zpack antibiotic sent to pharmacy. Push fluids and rest. Let us know if not improving with treatment.  Continue flovent daily and albuterol inhaler as needed.

## 2017-08-09 ENCOUNTER — Ambulatory Visit: Payer: Medicare Other | Admitting: Physician Assistant

## 2017-08-09 ENCOUNTER — Other Ambulatory Visit: Payer: Self-pay

## 2017-08-09 ENCOUNTER — Ambulatory Visit: Payer: Self-pay

## 2017-08-09 ENCOUNTER — Encounter: Payer: Self-pay | Admitting: Physician Assistant

## 2017-08-09 VITALS — BP 144/73 | HR 80 | Temp 98.0°F | Resp 17 | Ht 72.0 in | Wt 165.6 lb

## 2017-08-09 DIAGNOSIS — R062 Wheezing: Secondary | ICD-10-CM | POA: Diagnosis not present

## 2017-08-09 DIAGNOSIS — J441 Chronic obstructive pulmonary disease with (acute) exacerbation: Secondary | ICD-10-CM

## 2017-08-09 DIAGNOSIS — R05 Cough: Secondary | ICD-10-CM

## 2017-08-09 DIAGNOSIS — R319 Hematuria, unspecified: Secondary | ICD-10-CM

## 2017-08-09 DIAGNOSIS — R059 Cough, unspecified: Secondary | ICD-10-CM

## 2017-08-09 DIAGNOSIS — R82998 Other abnormal findings in urine: Secondary | ICD-10-CM

## 2017-08-09 DIAGNOSIS — R0602 Shortness of breath: Secondary | ICD-10-CM | POA: Diagnosis not present

## 2017-08-09 LAB — POCT URINALYSIS DIP (MANUAL ENTRY)
Bilirubin, UA: NEGATIVE
Glucose, UA: NEGATIVE mg/dL
Ketones, POC UA: NEGATIVE mg/dL
NITRITE UA: POSITIVE — AB
PH UA: 5.5 (ref 5.0–8.0)
Spec Grav, UA: 1.01 (ref 1.010–1.025)
UROBILINOGEN UA: 0.2 U/dL

## 2017-08-09 LAB — POCT CBC
Granulocyte percent: 78 %G (ref 37–80)
HCT, POC: 42 % — AB (ref 43.5–53.7)
Hemoglobin: 14.1 g/dL (ref 14.1–18.1)
LYMPH, POC: 1.5 (ref 0.6–3.4)
MCH: 28.9 pg (ref 27–31.2)
MCHC: 33.6 g/dL (ref 31.8–35.4)
MCV: 86.1 fL (ref 80–97)
MID (CBC): 0.9 (ref 0–0.9)
MPV: 8.5 fL (ref 0–99.8)
POC Granulocyte: 8.5 — AB (ref 2–6.9)
POC LYMPH %: 14.1 % (ref 10–50)
POC MID %: 7.9 % (ref 0–12)
Platelet Count, POC: 217 10*3/uL (ref 142–424)
RBC: 4.88 M/uL (ref 4.69–6.13)
RDW, POC: 14.2 %
WBC: 10.9 10*3/uL — AB (ref 4.6–10.2)

## 2017-08-09 LAB — GLUCOSE, POCT (MANUAL RESULT ENTRY): POC GLUCOSE: 106 mg/dL — AB (ref 70–99)

## 2017-08-09 LAB — POC INFLUENZA A&B (BINAX/QUICKVUE)
INFLUENZA B, POC: NEGATIVE
Influenza A, POC: NEGATIVE

## 2017-08-09 MED ORDER — ALBUTEROL SULFATE (2.5 MG/3ML) 0.083% IN NEBU
2.5000 mg | INHALATION_SOLUTION | Freq: Once | RESPIRATORY_TRACT | Status: AC
  Administered 2017-08-09: 2.5 mg via RESPIRATORY_TRACT

## 2017-08-09 MED ORDER — ALBUTEROL SULFATE HFA 108 (90 BASE) MCG/ACT IN AERS: 2.0000 | INHALATION_SPRAY | Freq: Four times a day (QID) | RESPIRATORY_TRACT | 0 refills | Status: DC | PRN

## 2017-08-09 MED ORDER — AMOXICILLIN-POT CLAVULANATE 875-125 MG PO TABS: 1.0000 | ORAL_TABLET | Freq: Two times a day (BID) | ORAL | 0 refills | Status: AC

## 2017-08-09 MED ORDER — IPRATROPIUM BROMIDE 0.02 % IN SOLN
0.5000 mg | Freq: Once | RESPIRATORY_TRACT | Status: AC
  Administered 2017-08-09: 0.5 mg via RESPIRATORY_TRACT

## 2017-08-09 MED ORDER — PREDNISONE 20 MG PO TABS: 40.0000 mg | ORAL_TABLET | Freq: Every day | ORAL | 0 refills | Status: AC

## 2017-08-09 NOTE — Telephone Encounter (Signed)
Pt is scheduled with Tenna Delaine today 08/09/2017.

## 2017-08-09 NOTE — Telephone Encounter (Signed)
  Reason for Disposition . Cough has been present for > 3 weeks  Answer Assessment - Initial Assessment Questions 1. ONSET: "When did the cough begin?"      Started 2-3 weeks ago 2. SEVERITY: "How bad is the cough today?"      Worse at night 3. RESPIRATORY DISTRESS: "Describe your breathing."      Short of breath with exertion 4. FEVER: "Do you have a fever?" If so, ask: "What is your temperature, how was it measured, and when did it start      No 5. SPUTUM: "Describe the color of your sputum" (clear, white, yellow, green)     Yellow 6. HEMOPTYSIS: "Are you coughing up any blood?" If so ask: "How much?" (flecks, streaks, tablespoons, etc.)     No 7. CARDIAC HISTORY: "Do you have any history of heart disease?" (e.g., heart attack, congestive heart failure)      Yes 8. LUNG HISTORY: "Do you have any history of lung disease?"  (e.g., pulmonary embolus, asthma, emphysema)     COPD 9. PE RISK FACTORS: "Do you have a history of blood clots?" (or: recent major surgery, recent prolonged travel, bedridden )     No 10. OTHER SYMPTOMS: "Do you have any other symptoms?" (e.g., runny nose, wheezing, chest pain)       Runny nose 11. PREGNANCY: "Is there any chance you are pregnant?" "When was your last menstrual period?"       No 12. TRAVEL: "Have you traveled out of the country in the last month?" (e.g., travel history, exposures)       No  Protocols used: COUGH - ACUTE PRODUCTIVE-A-AH "I feel a little better, but my feet are a little swollen and I can't lay flat in bed to sleep." States cough "is a little more loose." "I would feel better if someone listened to my chest." Appointment made for today.

## 2017-08-09 NOTE — Progress Notes (Signed)
MRN: [id number] DOB: 1941/04/16  Subjective:   Ray Fowler is a 76 y.o. 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  Per pt better than he was but still feels bad) .  Reports 3 week history of illness. Was out in the yard doing lots of yard work during this time. Started with scratchy throat. Started flovent daily and mucinex. It kept getting worse. Then developed dry cough and rhinorrhea. Had some wheezing and SOB. Pt was seen at PCP office on 08/05/17 for same illness and tx for bronchitis with zpack. CXR negative. Today,he notes he is on his last dose of azithromycin and still have nasal drainage and productive cough. Cough is worse when he tries to lie down at night. He is still feeling very sluggish. Notes since being on the zpack, his coffee has looked like coffee. Has had some ankle and feet swelling. He has continued to drink water. Denies fever, ear pain, sore throat, chest pain, heart palpitations, myalgia, nausea, vomiting, abdominal pain and diarrhea. Has not has sick contact with anyone. Has history of seasonal allergies. No hx of asthma. Has hx of COPD, takes flovent occasionally and ventolin prn.  He has not used his Ventolin at all since he was discharged from hospital in January 2018.  Was dx with flu/pnuemonia at the beginning of this year.  Patient has had flu shot this season. Former smoker, quit in 1995. Smoked 1 ppd x 5 years. Denies any other aggravating or relieving factors, no other questions or concerns.  [place] has a current medication list which includes the following prescription(s): amlodipine, atorvastatin, azithromycin, flovent hfa, fluticasone, guaifenesin, hydrochlorothiazide, meclizine, multivitamin with minerals,  macular vitamin benefit, nebivolol, promethazine, albuterol, amoxicillin-clavulanate, and prednisone. Also is allergic to lactose intolerance (gi).  [place]  has a past medical history of Allergic rhinitis, Aortic arch aneurysm ([place]) (10/2011), Aortic dissection ([place]) (2010), BCC (basal cell carcinoma of skin) (2014), Carotid bruit, Diverticulosis, HTN (hypertension), Hyperlipidemia, Pneumonia, Prostate cancer ([place]), Pulmonary nodule, Renal stones, S/P CABG x 1, and Skin cancer. 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   Objective:   Vitals: BP (!) 144/73 (BP Location: Right Arm, Patient Position: Sitting, Cuff Size: Normal)   Pulse 80   Temp 98 F (36.7 C) (Oral)   Resp 17   Ht 6' (1.829 m)   Wt 165 lb 9.6 oz (75.1 kg)   SpO2 94%   PF 400 L/min   BMI 22.46 kg/m   Physical Exam  Constitutional: He is oriented to person, place, and time. He appears well-developed and well-nourished. He appears distressed (appears like he does not feel well sitting on exam table).  HENT:  Head: Normocephalic and atraumatic.  Right Ear: External ear and ear canal normal. Tympanic membrane is not erythematous and not bulging. A middle ear effusion is present.  Left Ear: External ear and ear canal normal. Tympanic membrane is not erythematous and not bulging. A middle ear effusion is present.  Nose: Mucosal edema and rhinorrhea present. Right sinus exhibits no maxillary sinus  tenderness and no frontal sinus tenderness. Left sinus exhibits no maxillary sinus tenderness and no frontal sinus tenderness.  Mouth/Throat: Uvula is midline. Mucous membranes are dry. No tonsillar exudate.  Eyes: Conjunctivae are  normal.  Neck: Normal range of motion.  Cardiovascular: Normal rate and regular rhythm.  Murmur heard.  Systolic murmur is present. No JVD noted.   Pulmonary/Chest: Effort normal. No accessory muscle usage. No respiratory distress. He has wheezes (diffuse throughout lung fields bilaterally). He has rhonchi (diffuse throughout lung fields bilaterally). He has no rales.  Musculoskeletal:       Right lower leg: He exhibits no swelling and no edema.       Left lower leg: He exhibits no swelling and no edema.  Lymphadenopathy:       Head (right side): No submental, no submandibular, no tonsillar, no preauricular, no posterior auricular and no occipital adenopathy present.       Head (left side): No submental, no submandibular, no tonsillar, no preauricular, no posterior auricular and no occipital adenopathy present.  Neurological: He is alert and oriented to person, place, and time.  Skin: Skin is warm and dry.  Psychiatric: He has a normal mood and affect.  Vitals reviewed.   Results for orders placed or performed in visit on 08/09/17 (from the past 24 hour(s))  POCT CBC     Status: Abnormal   Collection Time: 08/09/17 12:47 PM  Result Value Ref Range   WBC 10.9 (A) 4.6 - 10.2 K/uL   Lymph, poc 1.5 0.6 - 3.4   POC LYMPH PERCENT 14.1 10 - 50 %L   MID (cbc) 0.9 0 - 0.9   POC MID % 7.9 0 - 12 %M   POC Granulocyte 8.5 (A) 2 - 6.9   Granulocyte percent 78.0 37 - 80 %G   RBC 4.88 4.69 - 6.13 M/uL   Hemoglobin 14.1 14.1 - 18.1 g/dL   HCT, POC 42.0 (A) 43.5 - 53.7 %   MCV 86.1 80 - 97 fL   MCH, POC 28.9 27 - 31.2 pg   MCHC 33.6 31.8 - 35.4 g/dL   RDW, POC 14.2 %   Platelet Count, POC 217 142 - 424 K/uL   MPV 8.5 0 - 99.8 fL  POCT urinalysis dipstick     Status: Abnormal   Collection Time: 08/09/17 12:50 PM  Result Value Ref Range   Color, UA yellow yellow   Clarity, UA clear clear   Glucose, UA negative negative mg/dL   Bilirubin, UA negative negative   Ketones, POC UA negative  negative mg/dL   Spec Grav, UA 1.010 1.010 - 1.025   Blood, UA small (A) negative   pH, UA 5.5 5.0 - 8.0   Protein Ur, POC =30 (A) negative mg/dL   Urobilinogen, UA 0.2 0.2 or 1.0 E.U./dL   Nitrite, UA Positive (A) Negative   Leukocytes, UA Small (1+) (A) Negative  POCT glucose (manual entry)     Status: Abnormal   Collection Time: 08/09/17 12:52 PM  Result Value Ref Range   POC Glucose 106 (A) 70 - 99 mg/dl  POC Influenza A&B(BINAX/QUICKVUE)     Status: None   Collection Time: 08/09/17 12:54 PM  Result Value Ref Range   Influenza A, POC Negative Negative   Influenza B, POC Negative Negative    Wt Readings from Last 3 Encounters:  08/09/17 165 lb 9.6 oz (75.1 kg)  08/05/17 168 lb (76.2 kg)  04/03/17 172 lb 8 oz (78.2 kg)  SpO2 while ambulating before DuoNeb was 92%.  Post DuoNeb wheezing has improved.  Rhonchi still noted diffusely throughout  lung field.  Patient admits to feeling less congested and less short of breath after DuoNeb. Peak flow reading is 400, about 78 % of predicted.  SpO2 after duoneb is 94%.  Assessment and Plan :   This case was precepted with Dr. Pamella Pert.  1. Cough - POCT CBC - POC Influenza A&B(BINAX/QUICKVUE) - POCT urinalysis dipstick - Check Pulse Oximetry while ambulating 2. Wheezing Improved with duoneb. - albuterol (PROVENTIL) (2.5 MG/3ML) 0.083% nebulizer solution 2.5 mg - POCT glucose (manual entry) - ipratropium (ATROVENT) nebulizer solution 0.5 mg 3. COPD exacerbation ([place]) History and physical exam findings consistent with COPD exacerbation. POC flu test negative. WBC of 10.9. Vitals stable. Will add additional antibiotic for broader coverage at this time.  Will also add prednisone course.  Educated on how to use albuterol and Flovent inhaler.  Post DuoNeb, SPO2 has increased from 92 to 94%.  Patient states he feels much better.  Given strict ED precautions.  Recommended follow-up in office in 3 days for reevaluation. -  amoxicillin-clavulanate (AUGMENTIN) 875-125 MG tablet; Take 1 tablet by mouth 2 (two) times daily for 7 days.  Dispense: 14 tablet; Refill: 0 - predniSONE (DELTASONE) 20 MG tablet; Take 2 tablets (40 mg total) by mouth daily with breakfast for 5 days.  Dispense: 10 tablet; Refill: 0 - albuterol (PROVENTIL HFA;VENTOLIN HFA) 108 (90 Base) MCG/ACT inhaler; Inhale 2 puffs into the lungs every 6 (six) hours as needed for wheezing or shortness of breath.  Dispense: 1 Inhaler; Refill: 0  4. SOB (shortness of breath) History and physical exam findings consistent with COPD exacerbation, however due to patient's extensive history of heart conditions, will check both BMP and BNP at this time.  Unlikely heart failure exacerbation.  There are no signs of fluid overload such as JVD or edema noted on exam.  - Basic metabolic panel - Brain natriuretic peptide  5. Hematuria, unspecified type UA with blood, nitrites, and leukocytes.  Urine micro and culture pending.  Patient was placed on Augmentin for COPD exacerbation, which will likely cover most common UTI bacterial etiologies.  Advised to return to clinic if symptoms worsen, do not improve, or as needed. - Urine Culture - Urinalysis, microscopic only - Basic metabolic panel  6. Leukocytes in urine  Tenna Delaine, PA-C  [facility] [place] 08/09/2017 6:22 PM

## 2017-08-09 NOTE — Patient Instructions (Addendum)
We are going to treat this as COPD exacerbation and underlying UTI.  I have sent off additional labs and urine culture and will contact you with these results.  In the meantime, I have added on an additional antibiotic.  Please take as prescribed.  I also added on prednisone for the wheezing. Prednisone is a steroid and can cause side effects such as headache, irritability, nausea, vomiting, increased heart rate, increased blood pressure, increased blood sugar, appetite changes, and insomnia. Please take tablets in the morning with a full meal to help decrease the chances of these side effects.  I also gave you prescription for albuterol inhaler.  Please use every 4-6 hours as needed for wheezing and shortness of breath.  Continue using daily Flovent as prescribed.  Please follow-up with me on Monday for reevaluation.  Return sooner if any of your symptoms worsen or you develop new concerning symptoms.   Chronic Obstructive Pulmonary Disease Exacerbation Chronic obstructive pulmonary disease (COPD) is a common lung problem. In COPD, the flow of air from the lungs is limited. COPD exacerbations are times that breathing gets worse and you need extra treatment. Without treatment they can be life threatening. If they happen often, your lungs can become more damaged. If your COPD gets worse, your doctor may treat you with:  Medicines.  Oxygen.  Different ways to clear your airway, such as using a mask.  Follow these instructions at home:  Do not smoke.  Avoid tobacco smoke and other things that bother your lungs.  If given, take your antibiotic medicine as told. Finish the medicine even if you start to feel better.  Only take medicines as told by your doctor.  Drink enough fluids to keep your pee (urine) clear or pale yellow (unless your doctor has told you not to).  Use a cool mist machine (vaporizer).  If you use oxygen or a machine that turns liquid medicine into a mist (nebulizer), continue  to use them as told.  Keep up with shots (vaccinations) as told by your doctor.  Exercise regularly.  Eat healthy foods.  Keep all doctor visits as told. Get help right away if:  You are very short of breath and it gets worse.  You have trouble talking.  You have bad chest pain.  You have blood in your spit (sputum).  You have a fever.  You keep throwing up (vomiting).  You feel weak, or you pass out (faint).  You feel confused.  You keep getting worse. This information is not intended to replace advice given to you by your health care provider. Make sure you discuss any questions you have with your health care provider. Document Released: 07/19/2011 Document Revised: 01/05/2016 Document Reviewed: 04/03/2013 Elsevier Interactive Patient Education  2017 [employer].    IF you received an x-ray today, you will receive an invoice from [facility]. Please contact [facility] at [phone] with questions or concerns regarding your invoice.   IF you received labwork today, you will receive an invoice from [facility]. Please contact [facility] at [phone] with questions or concerns regarding your invoice.   Our billing staff will not be able to assist you with questions regarding bills from these companies.  You will be contacted with the lab results as soon as they are available. The fastest way to get your results is to activate your My Chart account. Instructions are located on the last page of this paperwork. If you have not heard from [place] regarding the results in  2 weeks, please contact this office.

## 2017-08-10 LAB — URINALYSIS, MICROSCOPIC ONLY
Casts: NONE SEEN /lpf
EPITHELIAL CELLS (NON RENAL): NONE SEEN /HPF (ref 0–10)

## 2017-08-10 LAB — BASIC METABOLIC PANEL
BUN / CREAT RATIO: 26 — AB (ref 10–24)
BUN: 23 mg/dL (ref 8–27)
CO2: 25 mmol/L (ref 20–29)
CREATININE: 0.89 mg/dL (ref 0.76–1.27)
Calcium: 9.1 mg/dL (ref 8.6–10.2)
Chloride: 100 mmol/L (ref 96–106)
GFR calc Af Amer: 97 mL/min/{1.73_m2} (ref >59)
GFR calc non Af Amer: 84 mL/min/{1.73_m2} (ref >59)
GLUCOSE: 93 mg/dL (ref 65–99)
Potassium: 3.7 mmol/L (ref 3.5–5.2)
Sodium: 142 mmol/L (ref 134–144)

## 2017-08-10 LAB — BRAIN NATRIURETIC PEPTIDE: BNP: 248.7 pg/mL — AB (ref 0.0–100.0)

## 2017-08-12 ENCOUNTER — Encounter: Payer: Self-pay | Admitting: Family Medicine

## 2017-08-12 ENCOUNTER — Other Ambulatory Visit: Payer: Self-pay | Admitting: Physician Assistant

## 2017-08-12 ENCOUNTER — Ambulatory Visit: Payer: Medicare Other | Admitting: Family Medicine

## 2017-08-12 DIAGNOSIS — R05 Cough: Secondary | ICD-10-CM | POA: Diagnosis not present

## 2017-08-12 DIAGNOSIS — R059 Cough, unspecified: Secondary | ICD-10-CM

## 2017-08-12 LAB — URINE CULTURE

## 2017-08-12 MED ORDER — DOXYCYCLINE HYCLATE 100 MG PO CAPS: 100.0000 mg | ORAL_CAPSULE | Freq: Two times a day (BID) | ORAL | 0 refills | Status: DC

## 2017-08-12 NOTE — Patient Instructions (Signed)
Keep going as is.  Use albuterol as needed for cough.  Update [place] as needed.  Take care.  Glad to see you.

## 2017-08-12 NOTE — Progress Notes (Signed)
He had ucx positive, treated with augmentin.  No dysuria now or prev.  Still on pred and augmentin.    At this point, "I'm about 85% back".  Appetite and taste are better.  Urine is normal.  No fevers.  Still with some cough.  Some sputum but less than prev, more rhinorrhea than sputum but that is also improved.    Foot and ankle swelling is resolved.    Prev uro eval with PRN f/u, as needed.    Meds, vitals, and allergies reviewed.   ROS: Per HPI unless specifically indicated in ROS section   GEN: nad, alert and oriented HEENT: mucous membranes moist, mild nasal congestion, mild posterior oral irritation.  No exudates. NECK: supple w/o LA CV: rrr. PULM: ctab except for mild and scattered bilateral expiratory rhonchi, no inc wob ABD: soft, +bs EXT: no edema SKIN: no acute rash

## 2017-08-12 NOTE — Progress Notes (Signed)
Meds ordered this encounter  Medications  . doxycycline (VIBRAMYCIN) 100 MG capsule    Sig: Take 1 capsule (100 mg total) by mouth 2 (two) times daily.    Dispense:  20 capsule    Refill:  0    Order Specific Question:   Supervising Provider    Answer:   Wardell Honour [[id number]]

## 2017-08-14 NOTE — Assessment & Plan Note (Signed)
Looks to be much improved.  He feels better.  Continue as is and update [place] as needed.  See after visit summary.  Supportive care otherwise.  He agrees.

## 2017-08-21 ENCOUNTER — Ambulatory Visit (INDEPENDENT_AMBULATORY_CARE_PROVIDER_SITE_OTHER): Payer: Medicare Other

## 2017-08-21 ENCOUNTER — Other Ambulatory Visit: Payer: Self-pay | Admitting: Family Medicine

## 2017-08-21 VITALS — BP 134/80 | HR 70 | Temp 97.8°F | Ht 70.5 in | Wt 160.8 lb

## 2017-08-21 DIAGNOSIS — Z125 Encounter for screening for malignant neoplasm of prostate: Secondary | ICD-10-CM

## 2017-08-21 DIAGNOSIS — E785 Hyperlipidemia, unspecified: Secondary | ICD-10-CM

## 2017-08-21 DIAGNOSIS — I4892 Unspecified atrial flutter: Secondary | ICD-10-CM

## 2017-08-21 DIAGNOSIS — Z0000 Encounter for general adult medical examination without abnormal findings: Secondary | ICD-10-CM | POA: Diagnosis not present

## 2017-08-21 LAB — LIPID PANEL
CHOL/HDL RATIO: 4
CHOLESTEROL: 129 mg/dL (ref 0–200)
HDL: 36.1 mg/dL — AB (ref >39.00)
LDL CALC: 71 mg/dL (ref 0–99)
NonHDL: 93.1
TRIGLYCERIDES: 110 mg/dL (ref 0.0–149.0)
VLDL: 22 mg/dL (ref 0.0–40.0)

## 2017-08-21 LAB — TSH: TSH: 2.3 u[IU]/mL (ref 0.35–4.50)

## 2017-08-21 LAB — HEPATIC FUNCTION PANEL
ALT: 21 U/L (ref 0–53)
AST: 18 U/L (ref 0–37)
Albumin: 3.9 g/dL (ref 3.5–5.2)
Alkaline Phosphatase: 56 U/L (ref 39–117)
BILIRUBIN DIRECT: 0.2 mg/dL (ref 0.0–0.3)
BILIRUBIN TOTAL: 0.7 mg/dL (ref 0.2–1.2)
Total Protein: 7 g/dL (ref 6.0–8.3)

## 2017-08-21 LAB — PSA, MEDICARE: PSA: 0 ng/ml — ABNORMAL LOW (ref 0.10–4.00)

## 2017-08-21 NOTE — Progress Notes (Signed)
Subjective:   Ray Fowler is a 77 y.o. male who presents for Medicare Annual/Subsequent preventive examination.  Review of Systems:  N/A Cardiac Risk Factors include: advanced age (>52men, >33 women);male gender;dyslipidemia;hypertension     Objective:    Vitals: BP 134/80 (BP Location: Right Arm, Patient Position: Sitting, Cuff Size: Normal)   Pulse 70   Temp 97.8 F (36.6 C) (Oral)   Ht 5' 10.5" (1.791 m) Comment: no shoes  Wt 160 lb 12 oz (72.9 kg)   SpO2 98%   BMI 22.74 kg/m   Body mass index is 22.74 kg/m.  Advanced Directives 08/21/2017 08/25/2016 08/21/2016 08/15/2016 10/28/2011  Does Patient Have a Medical Advance Directive? Yes Yes Yes Yes Patient has advance directive, copy not in chart  Type of Advance Directive [place];Living will Healthcare Power of [place];Living will Living will  Does patient want to make changes to medical advance directive? - No - Patient declined - - -  Copy of Icard in Chart? No - copy requested No - copy requested - No - copy requested Copy requested from family  Pre-existing out of facility DNR order (yellow form or pink MOST form) - - - - No    Tobacco Social History   Tobacco Use  Smoking Status Former Smoker  . Packs/day: 1.00  . Years: 25.00  . Pack years: 25.00  . Types: Cigarettes  . Last attempt to quit: 07/10/1994  . Years since quitting: 23.1  Smokeless Tobacco Never Used  Tobacco Comment   quit 1995     Counseling given: No Comment: quit 1995   Clinical Intake:  Pre-visit preparation completed: Yes  Pain : No/denies pain Pain Score: 0-No pain     Nutritional Status: BMI of 19-24  Normal Nutritional Risks: None Diabetes: No  How often do you need to have someone help you when you read instructions, pamphlets, or other written materials from your doctor or pharmacy?: 1 - Never What is the last grade level you completed in school?:  Bachelor degree  Interpreter Needed?: No  Comments: pt lives with spouse Information entered by :: LPinson, LPN  Past Medical History:  Diagnosis Date  . Allergic rhinitis   . 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 BCC (basal cell carcinoma of skin) 2014  . Carotid bruit    Right. Carotid u/s 2/11 normal.  . Diverticulosis   . HTN (hypertension)   . Hyperlipidemia   . Pneumonia   . Prostate cancer ([place])   . Pulmonary nodule    Stable on CT 2/11  . Renal stones   . S/P CABG x 1   . Skin cancer    scalp 2007   Past Surgical History:  Procedure Laterality Date  . ABDOMINAL AORTIC ANEURYSM REPAIR  march 2013  . 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 CARDIAC SURGERY  2013   at [facility]- transverse aortic arch/descending aneurysm repair s/p debranching procedure  (total of 5 Gore C-TAG devices extending from distal descending aorta to his prev placed ascending dacron graft)  . COLONOSCOPY    . Flex- Polyp   06/1999   at 38cm, Int/Ext Hemm divertics [place] Plan)  . Hemi Arch Replacement  03/2009 and repeat 10/2011   and AoV resuspension w/ Repl of Ascending Aorta and Hemiarch w/ Graft; Pericardial Tamponade Aortic Regurg 8/22-8/28/10;  endovascular  repair of aortic dilation 2013 with  ascending aorta to L common carotid, R subclavian, R common carotid and L carotid subclavian bypass  . [facility]  08/22-8/28/2010   Thoracic aortic dissection pericardial teamponade aortic regurg  . PENILE PROSTHESIS IMPLANT  1996  . POLYPECTOMY    . PROSTATECTOMY  12/1993   Dr. Risa Grill   Family History  Problem Relation Age of Onset  . Heart failure Mother        died age 47  . Hypertension Mother   . Heart disease Mother        CHF  . Kidney failure Father   . Heart disease Father        coronary disease, CABG  . Stroke Father        due to "infection" from [place]  . Kidney disease Father        chronic    . Diabetes Other   . Diabetes Other   . Hyperlipidemia Daughter   . Colon cancer Neg Hx   . Esophageal cancer Neg Hx   . Stomach cancer Neg Hx   . Rectal cancer Neg Hx    Social History   Socioeconomic History  . Marital status: Married    Spouse name: None  . Number of children: 3  . Years of education: None  . Highest education level: None  Social Needs  . Financial resource strain: None  . Food insecurity - worry: None  . Food insecurity - inability: None  . Transportation needs - medical: None  . Transportation needs - non-medical: None  Occupational History  . 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 Smoking status: Former Smoker    Packs/day: 1.00    Years: 25.00    Pack years: 25.00    Types: Cigarettes    Last attempt to quit: 07/10/1994    Years since quitting: 23.1  . Smokeless tobacco: Never Used  . Tobacco comment: quit 1995  Substance and Sexual Activity  . Alcohol use: Yes    Comment: occassionally wine  . Drug use: No  . Sexual activity: Yes  Other Topics Concern  . None  Social History Narrative   Married 1964    Outpatient Encounter Medications as of 08/21/2017  Medication Sig  . albuterol (PROVENTIL HFA;VENTOLIN HFA) 108 (90 Base) MCG/ACT inhaler Inhale 2 puffs into the lungs every 6 (six) hours as needed for wheezing or shortness of breath.  Marland Kitchen amLODipine (NORVASC) 2.5 MG tablet TAKE 1 TABLET (2.5 MG TOTAL) BY MOUTH 2 (TWO) TIMES DAILY.  Marland Kitchen atorvastatin (LIPITOR) 20 MG tablet TAKE 1 TABLET (20 MG TOTAL) BY MOUTH AT BEDTIME.  Marland Kitchen doxycycline (VIBRAMYCIN) 100 MG capsule Take 1 capsule (100 mg total) by mouth 2 (two) times daily.  Marland Kitchen FLOVENT HFA 110 MCG/ACT inhaler INHALE ONE PUFF BY MOUTH TWICE DAILY. RINSE MOUTH AFTER USE..  . fluticasone (FLONASE) 50 MCG/ACT nasal spray SPRAY TWO SPRAYS IN EACH NOSTRIL DAILY  . guaiFENesin (MUCINEX) 600 MG 12 hr tablet Take by mouth 2 (two) times  daily as needed.  . hydrochlorothiazide (HYDRODIURIL) 25 MG tablet Take 1 tablet (25 mg total) by mouth daily.  . meclizine (ANTIVERT) 25 MG tablet Take 1 tablet (25 mg total) by mouth 3 (three) times daily as needed.  . Multiple Vitamin (MULITIVITAMIN WITH MINERALS) TABS Take 1 tablet by mouth daily.  . Multiple Vitamins-Minerals (MACULAR VITAMIN BENEFIT) TABS Take 1 tablet  by mouth daily. Lutein 25 mg and Zeaxanthin 5 mg  . nebivolol (BYSTOLIC) 10 MG tablet Take 2 tablets (20 mg total) daily by mouth.  . promethazine (PHENERGAN) 25 MG tablet Take 1 tablet (25 mg total) by mouth every 6 (six) hours as needed for nausea or vomiting.   No facility-administered encounter medications on file as of 08/21/2017.     Activities of Daily Living In your present state of health, do you have any difficulty performing the following activities: 08/21/2017 08/25/2016  Hearing? N N  Vision? 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 N N  Walking or climbing stairs? N N  Dressing or bathing? N N  Doing errands, shopping? N N  Preparing Food and eating ? N -  Using the Toilet? N -  In the past six months, have you accidently leaked urine? Y -  Comment wears pad -  Do you have problems with loss of bowel control? N -  Managing your Medications? N -  Managing your Finances? N -  Housekeeping or managing your Housekeeping? N -  Some recent data might be hidden    Patient Care Team: Tonia Ghent, MD as PCP - General Rockey Situ Kathlene November, MD (Cardiology) Sharyne Peach, MD as Consulting Physician (Ophthalmology) Rana Snare, MD as Consulting Physician (Urology) Druscilla Brownie, MD as Consulting Physician (Dermatology) Anson Crofts, MD as Attending Physician (Cardiothoracic Surgery)   Assessment:   This is a routine wellness examination for Ray Fowler.   Hearing Screening   125Hz  250Hz  500Hz  1000Hz  2000Hz  3000Hz  4000Hz  6000Hz  8000Hz   Right  ear:   40 0 40  0    Left ear:   40 40 40  40    Vision Screening Comments: Vision exams every 6 mths; appt scheduled 08/21/17 with Dr. Delman Cheadle  Exercise Activities and Dietary recommendations Current Exercise Habits: Home exercise routine, Type of exercise: walking;Other - see comments(elliptical bike 40 min 3 days per week), Time (Minutes): 30, Frequency (Times/Week): 7, Weekly Exercise (Minutes/Week): 210, Intensity: Moderate, Exercise limited by: None identified  Goals    . Increase physical activity     Starting 08/21/2017, I will continue to walk for 15-30 minutes daily and to exercise on elliptical bike for 40 minutes 3 days per week.        Fall Risk Fall Risk  08/21/2017 08/15/2016 08/11/2015 07/30/2013 07/18/2012  Falls in the past year? No No No No No   Depression Screen PHQ 2/9 Scores 08/21/2017 08/09/2017 08/15/2016 08/11/2015  PHQ - 2 Score 0 0 0 0  PHQ- 9 Score 0 - - -    Cognitive Function MMSE - Mini Mental State Exam 08/21/2017 08/15/2016  Orientation to time 5 5  Orientation to Place 5 5  Registration 3 3  Attention/ Calculation 0 0  Recall 3 3  Language- name 2 objects 0 0  Language- repeat 1 1  Language- follow 3 step command 3 3  Language- read & follow direction 0 0  Write a sentence 0 0  Copy design 0 0  Total score 20 20     PLEASE NOTE: A Mini-Cog screen was completed. Maximum score is 20. A value of 0 denotes this part of [place] MMSE was not completed or the patient failed this part of the Mini-Cog screening.   Mini-Cog Screening Orientation to Time - Max 5 pts Orientation to Place - Max 5 pts Registration - Max 3 pts Recall -  Max 3 pts Language Repeat - Max 1 pts Language Follow 3 Step Command - Max 3 pts     Immunization History  Administered Date(s) Administered  . Influenza Split 07/11/2011, 05/08/2012  . Influenza Whole 06/11/2007, 06/15/2008, 05/30/2009, 06/29/2010  . Influenza,inj,Quad PF,6+ Mos 06/25/2013, 04/15/2014, 04/20/2015, 05/02/2016,  05/28/2017  . Pneumococcal Conjugate-13 08/09/2014  . Pneumococcal Polysaccharide-23 06/28/2009, 05/09/2010  . Td 07/28/2004  . Zoster 06/19/2007    Screening Tests Health Maintenance  Topic Date Due  . COLONOSCOPY  08/12/2018 (Originally 08/14/2017)  . TETANUS/TDAP  07/27/2024 (Originally 07/28/2014)  . INFLUENZA VACCINE  Completed  . PNA vac Low Risk Adult  Completed     Plan:  I have personally reviewed, addressed, and noted the following in the patient's chart:  A. Medical and social history B. Use of alcohol, tobacco or illicit drugs  C. Current medications and supplements D. Functional ability and status E.  Nutritional status F.  Physical activity G. Advance directives H. List of other physicians I.  Hospitalizations, surgeries, and ER visits in previous 12 months J.  [place] to include hearing, vision, cognitive, depression L. Referrals and appointments - none  In addition, I have reviewed and discussed with patient certain preventive protocols, quality metrics, and best practice recommendations. A written personalized care plan for preventive services as well as general preventive health recommendations were provided to patient.  See attached scanned questionnaire for additional information.   Signed,   Lindell Noe, MHA, BS, LPN Health Coach

## 2017-08-21 NOTE — Progress Notes (Signed)
PCP notes:   Health maintenance:  Colonoscopy - PCP please address at next appt  Abnormal screenings:   Hearing - failed  Hearing Screening   125Hz  250Hz  500Hz  1000Hz  2000Hz  3000Hz  4000Hz  6000Hz  8000Hz   Right ear:   40 0 40  0    Left ear:   40 40 40  40     Patient concerns:   Pt is still recovering from UTI. One more day remaining of antibiotic. Appetite has improved.  Pt is requesting referrals to dermatology and urology because current specialists are retiring.   Nurse concerns:  None  Next PCP appt:   08/27/17 @ 1045

## 2017-08-21 NOTE — Progress Notes (Signed)
   Subjective:    Patient ID: Ray Fowler, male    DOB: Jul 12, 1941, 77 y.o.   MRN: [id number]  HPI  I reviewed health advisor's note, was available for consultation, and agree with documentation and plan.   Review of Systems     Objective:   Physical Exam        Assessment & Plan:

## 2017-08-21 NOTE — Progress Notes (Signed)
Pre visit review using our clinic review tool, if applicable. No additional management support is needed unless otherwise documented below in the visit note. 

## 2017-08-21 NOTE — Patient Instructions (Addendum)
Ray Fowler , Thank you for taking time to come for your Medicare Wellness Visit. I appreciate your ongoing commitment to your health goals. Please review the following plan we discussed and let me know if I can assist you in the future.   These are the goals we discussed: Goals    . Increase physical activity     Starting 08/21/2017, I will continue to walk for 15-30 minutes daily and to exercise on elliptical bike for 40 minutes 3 days per week.        This is a list of the screening recommended for you and due dates:  Health Maintenance  Topic Date Due  . Colon Cancer Screening  08/12/2018*  . Tetanus Vaccine  07/27/2024*  . Flu Shot  Completed  . Pneumonia vaccines  Completed  *Topic was postponed. The date shown is not the original due date.   Preventive Care for Adults  A healthy lifestyle and preventive care can promote health and wellness. Preventive health guidelines for adults include the following key practices.  . A routine yearly physical is a good way to check with your health care provider about your health and preventive screening. It is a chance to share any concerns and updates on your health and to receive a thorough exam.  . Visit your dentist for a routine exam and preventive care every 6 months. Brush your teeth twice a day and floss once a day. Good oral hygiene prevents tooth decay and gum disease.  . The frequency of eye exams is based on your age, health, family medical history, use  of contact lenses, and other factors. Follow your health care provider's recommendations for frequency of eye exams.  . Eat a healthy diet. Foods like vegetables, fruits, whole grains, low-fat dairy products, and lean protein foods contain the nutrients you need without too many calories. Decrease your intake of foods high in solid fats, added sugars, and salt. Eat the right amount of calories for you. Get information about a proper diet from your health care provider, if  necessary.  . Regular physical exercise is one of the most important things you can do for your health. Most adults should get at least 150 minutes of moderate-intensity exercise (any activity that increases your heart rate and causes you to sweat) each week. In addition, most adults need muscle-strengthening exercises on 2 or more days a week.  Silver Sneakers may be a benefit available to you. To determine eligibility, you may visit the website: www.silversneakers.com or contact program at 828-793-8493 Mon-Fri between 8AM-8PM.   . Maintain a healthy weight. The body mass index (BMI) is a screening tool to identify possible weight problems. It provides an estimate of body fat based on height and weight. Your health care provider can find your BMI and can help you achieve or maintain a healthy weight.   For adults 20 years and older: ? A BMI below 18.5 is considered underweight. ? A BMI of 18.5 to 24.9 is normal. ? A BMI of 25 to 29.9 is considered overweight. ? A BMI of 30 and above is considered obese.   . Maintain normal blood lipids and cholesterol levels by exercising and minimizing your intake of saturated fat. Eat a balanced diet with plenty of fruit and vegetables. Blood tests for lipids and cholesterol should begin at age 33 and be repeated every 5 years. If your lipid or cholesterol levels are high, you are over 50, or you are at high risk for  heart disease, you may need your cholesterol levels checked more frequently. Ongoing high lipid and cholesterol levels should be treated with medicines if diet and exercise are not working.  . If you smoke, find out from your health care provider how to quit. If you do not use tobacco, please do not start.  . If you choose to drink alcohol, please do not consume more than 2 drinks per day. One drink is considered to be 12 ounces (355 mL) of beer, 5 ounces (148 mL) of wine, or 1.5 ounces (44 mL) of liquor.  . If you are 47-12 years old, ask your  health care provider if you should take aspirin to prevent strokes.  . Use sunscreen. Apply sunscreen liberally and repeatedly throughout the day. You should seek shade when your shadow is shorter than you. Protect yourself by wearing long sleeves, pants, a wide-brimmed hat, and sunglasses year round, whenever you are outdoors.  . Once a month, do a whole body skin exam, using a mirror to look at the skin on your back. Tell your health care provider of new moles, moles that have irregular borders, moles that are larger than a pencil eraser, or moles that have changed in shape or color.

## 2017-08-25 ENCOUNTER — Other Ambulatory Visit: Payer: Self-pay | Admitting: Family Medicine

## 2017-08-27 ENCOUNTER — Ambulatory Visit (INDEPENDENT_AMBULATORY_CARE_PROVIDER_SITE_OTHER): Payer: Medicare Other | Admitting: Family Medicine

## 2017-08-27 ENCOUNTER — Encounter: Payer: Self-pay | Admitting: Family Medicine

## 2017-08-27 VITALS — BP 134/80 | HR 70 | Temp 97.8°F | Ht 70.5 in | Wt 160.8 lb

## 2017-08-27 DIAGNOSIS — I71019 Dissection of thoracic aorta, unspecified: Secondary | ICD-10-CM

## 2017-08-27 DIAGNOSIS — R059 Cough, unspecified: Secondary | ICD-10-CM

## 2017-08-27 DIAGNOSIS — R05 Cough: Secondary | ICD-10-CM

## 2017-08-27 DIAGNOSIS — Z0000 Encounter for general adult medical examination without abnormal findings: Secondary | ICD-10-CM

## 2017-08-27 DIAGNOSIS — I7101 Dissection of thoracic aorta: Secondary | ICD-10-CM | POA: Diagnosis not present

## 2017-08-27 DIAGNOSIS — I25118 Atherosclerotic heart disease of native coronary artery with other forms of angina pectoris: Secondary | ICD-10-CM

## 2017-08-27 DIAGNOSIS — Z7189 Other specified counseling: Secondary | ICD-10-CM

## 2017-08-27 DIAGNOSIS — Z9889 Other specified postprocedural states: Secondary | ICD-10-CM | POA: Diagnosis not present

## 2017-08-27 DIAGNOSIS — I10 Essential (primary) hypertension: Secondary | ICD-10-CM

## 2017-08-27 DIAGNOSIS — Z8546 Personal history of malignant neoplasm of prostate: Secondary | ICD-10-CM

## 2017-08-27 DIAGNOSIS — E785 Hyperlipidemia, unspecified: Secondary | ICD-10-CM

## 2017-08-27 MED ORDER — HYDROCHLOROTHIAZIDE 25 MG PO TABS: 25.0000 mg | ORAL_TABLET | ORAL | Status: DC

## 2017-08-27 NOTE — Progress Notes (Signed)
Colonoscopy d/w pt.  Likely reasonable to proceed with colonoscopy but I'll defer to GI.  He'll call about follow up.   PSA still zero.  He wanted to get set up with [facility] urology since that would be closer for patient.  No acute issues.   He wanted to f/u with prev derm office and then update me if needed.  D/w pt.  His prev derm MD is retiring but bringing in a new MD.  Hearing - failed.  Declined hearing aids.   Advance directive- wife designated if patient were incapacitated.   H/o UTI. Sx resolved in the meantime.  Done with abx.  No dysuria.  D/w pt.  No specific f/u needed about this.  Prev Ucx and sens d/w pt.    Hypertension, h/o dissection of aorta, history of coronary artery disease. Using medication without problems or lightheadedness: yes Chest pain with exertion:no Edema:no Short of breath:no D/w pt about continuing aspirin.  Reasonable to continue.  No ADE on med.  No bleeding.    Elevated Cholesterol: Using medications without problems:yes Muscle aches: no muscles aches but some tolerable joint aches, can be variable joints (knee, hip, etc). Diet compliance: yes Exercise: yes  Cough.  Using flonvent in the fall and winter. Doesn't need in the spring and summer. Recent cough, some occ chest congestion, but no acute sx now. .    He has some episodic vertigo in the fall of the year but not now.    Meds, vitals, and allergies reviewed.   PMH and SH reviewed  ROS: Per HPI unless specifically indicated in ROS section   GEN: nad, alert and oriented HEENT: mucous membranes moist NECK: supple w/o LA CV: rrr. PULM: ctab, no inc wob ABD: soft, +bs EXT: no edema SKIN: no acute rash

## 2017-08-27 NOTE — Patient Instructions (Addendum)
Call GI about follow up.  [phone] You'll get a call about the referral.   Check with your insurance to see if they will cover the tetanus shot. Don't change your meds for now.  Take care.  Glad to see you.

## 2017-08-28 DIAGNOSIS — Z0000 Encounter for general adult medical examination without abnormal findings: Secondary | ICD-10-CM | POA: Insufficient documentation

## 2017-08-28 NOTE — Assessment & Plan Note (Signed)
Advance directive- wife designated if patient were incapacitated.  

## 2017-08-28 NOTE — Assessment & Plan Note (Signed)
Able to tolerate statin as is.  Continue work on diet and exercise.  Labs discussed with patient.  He agrees.

## 2017-08-28 NOTE — Assessment & Plan Note (Signed)
Continue secondary prevention with risk factor reduction.  Labs and blood pressure goal discussed with patient.  Update me as needed.

## 2017-08-28 NOTE — Assessment & Plan Note (Signed)
Refer to urology to establish with new MD.  This is reasonable.

## 2017-08-28 NOTE — Assessment & Plan Note (Signed)
Continue secondary prevention with risk factor reduction.  Labs and blood pressure goal discussed with patient.  Update me as needed.  Continue work on diet and exercise.  Discussed. >25 minutes spent in face to face time with patient, >50% spent in counselling or coordination of care.

## 2017-08-28 NOTE — Assessment & Plan Note (Signed)
History of.  No chest pain.  Continue secondary prevention with risk factor reduction.  Labs and blood pressure goal discussed with patient.  Update me as needed.

## 2017-08-28 NOTE — Assessment & Plan Note (Signed)
See above.  No new symptoms.  He will have episodic routine follow-up at [facility] clinic.  I will defer. He agrees.

## 2017-08-28 NOTE — Assessment & Plan Note (Signed)
Colonoscopy d/w pt.  Likely reasonable to proceed with colonoscopy but I'll defer to GI.  He'll call about follow up.   PSA still zero.  He wanted to get set up with [facility] urology since that would be closer for patient.  No acute issues.   He wanted to f/u with prev derm office and then update me if needed.  D/w pt.  His prev derm MD is retiring but bringing in a new MD.  Hearing - failed.  Declined hearing aids.   Advance directive- wife designated if patient were incapacitated.

## 2017-08-28 NOTE — Assessment & Plan Note (Signed)
Using flonvent in the fall and winter. Doesn't need in the spring and summer. Recent cough, some occ chest congestion, but no acute sx now. Continue as is for now.  He agrees.

## 2017-09-09 ENCOUNTER — Other Ambulatory Visit: Payer: Self-pay | Admitting: Cardiovascular Disease

## 2017-09-11 ENCOUNTER — Encounter: Payer: Self-pay | Admitting: Gastroenterology

## 2017-09-12 ENCOUNTER — Encounter: Payer: Self-pay | Admitting: Urology

## 2017-09-12 ENCOUNTER — Ambulatory Visit (INDEPENDENT_AMBULATORY_CARE_PROVIDER_SITE_OTHER): Payer: Medicare Other | Admitting: Urology

## 2017-09-12 VITALS — BP 160/83 | HR 51 | Ht 70.0 in | Wt 166.0 lb

## 2017-09-12 DIAGNOSIS — Z8546 Personal history of malignant neoplasm of prostate: Secondary | ICD-10-CM | POA: Diagnosis not present

## 2017-09-12 NOTE — Progress Notes (Signed)
09/12/2017 12:28 PM   Ray Fowler 01/25/1941 [id number]  Referring provider: Tonia Ghent, MD [address] [place], [place] [address]  Chief Complaint  Patient presents with  . Prostate Cancer    New patient    HPI: Ray Fowler is a 77 year old male who presents to establish urologic care.  He has previously seen Dr. Risa Grill in [place] and last saw him in October 2018.  He was diagnosed with prostate cancer in 1995 and underwent an open radical retropubic prostatectomy.  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  His PSA has remained undetectable and was last checked in January 2019.  He had an episode of gross hematuria in 2016 and cystoscopy showed no significant abnormalities.  Abdominal imaging remarkable for renal cysts.  He has no specific complaints today and the reason of his visit was to establish urologic care.   PMH: Past Medical History:  Diagnosis Date  . Allergic rhinitis   . 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 BCC (basal cell carcinoma of skin) 2014  . Carotid bruit    Right. Carotid u/s 2/11 normal.  . Diverticulosis   . HTN (hypertension)   . Hyperlipidemia   . Pneumonia   . Prostate cancer ([place])   . Pulmonary nodule    Stable on CT 2/11  . Renal stones   . S/P CABG x 1   . Skin cancer    scalp 2007    Surgical History: Past Surgical History:  Procedure Laterality Date  . ABDOMINAL AORTIC ANEURYSM REPAIR  march 2013  . 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 CARDIAC SURGERY  2013   at [facility]- transverse aortic arch/descending aneurysm repair s/p debranching procedure  (total of 5 Gore C-TAG devices extending from distal descending aorta to his prev placed ascending dacron graft)  . COLONOSCOPY    . Flex- Polyp   06/1999   at 38cm, Int/Ext Hemm divertics [facility])  . Hemi Arch Replacement  03/2009 and  repeat 10/2011   and AoV resuspension w/ Repl of Ascending Aorta and Hemiarch w/ Graft; Pericardial Tamponade Aortic Regurg 8/22-8/28/10;  endovascular repair of aortic dilation 2013 with  ascending aorta to L common carotid, R subclavian, R common carotid and L carotid subclavian bypass  . [facility]  08/22-8/28/2010   Thoracic aortic dissection pericardial teamponade aortic regurg  . PENILE PROSTHESIS IMPLANT  1996  . POLYPECTOMY    . PROSTATECTOMY  12/1993   Dr. Risa Grill    Home Medications:  Allergies as of 09/12/2017      Reactions   Lactose Intolerance (gi)       Medication List        Accurate as of 09/12/17 12:28 PM. Always use your most recent med list.          albuterol 108 (90 Base) MCG/ACT inhaler Commonly known as:  PROVENTIL HFA;VENTOLIN HFA Inhale 2 puffs into the lungs every 6 (six) hours as needed for wheezing or shortness of breath.   amLODipine 2.5 MG tablet Commonly known as:  NORVASC TAKE 1 TABLET (2.5 MG TOTAL) BY MOUTH 2 (TWO) TIMES DAILY.   atorvastatin 20 MG tablet Commonly known as:  LIPITOR TAKE 1 TABLET (20 MG TOTAL) BY MOUTH AT BEDTIME.   BYSTOLIC 10 MG tablet Generic drug:  nebivolol TAKE 2 TABLETS (20 MG TOTAL) DAILY BY MOUTH.  FLOVENT HFA 110 MCG/ACT inhaler Generic drug:  fluticasone INHALE ONE PUFF BY MOUTH TWICE DAILY. RINSE MOUTH AFTER USE..   fluticasone 50 MCG/ACT nasal spray Commonly known as:  FLONASE SPRAY TWO SPRAYS IN EACH NOSTRIL DAILY   guaiFENesin 600 MG 12 hr tablet Commonly known as:  MUCINEX Take by mouth 2 (two) times daily as needed.   hydrochlorothiazide 25 MG tablet Commonly known as:  HYDRODIURIL Take 1 tablet (25 mg total) by mouth every other day.   meclizine 25 MG tablet Commonly known as:  ANTIVERT Take 1 tablet (25 mg total) by mouth 3 (three) times daily as needed.   multivitamin with minerals Tabs tablet Take 1 tablet by mouth daily.   promethazine 25 MG tablet Commonly known as:   PHENERGAN Take 1 tablet (25 mg total) by mouth every 6 (six) hours as needed for nausea or vomiting.       Allergies:  Allergies  Allergen Reactions  . Lactose Intolerance (Gi)     Family History: Family History  Problem Relation Age of Onset  . Heart failure Mother        died age 72  . Hypertension Mother   . Heart disease Mother        CHF  . Kidney failure Father   . Heart disease Father        coronary disease, CABG  . Stroke Father        due to "infection" from [place]  . Kidney disease Father        chronic  . Prostate cancer Father   . Diabetes Other   . Diabetes Other   . Hyperlipidemia Daughter   . Colon cancer Neg Hx   . Esophageal cancer Neg Hx   . Stomach cancer Neg Hx   . Rectal cancer Neg Hx     Social History:  reports that he quit smoking about 23 years ago. His smoking use included cigarettes. He has a 25.00 pack-year smoking history. he has never used smokeless tobacco. He reports that he drinks alcohol. He reports that he does not use drugs.  ROS: UROLOGY Frequent Urination?: No Hard to postpone urination?: No Burning/pain with urination?: No Get up at night to urinate?: Yes Leakage of urine?: No Urine stream starts and stops?: No Trouble starting stream?: No Do you have to strain to urinate?: No Blood in urine?: No Urinary tract infection?: No Sexually transmitted disease?: No Injury to kidneys or bladder?: No Painful intercourse?: No Weak stream?: No Erection problems?: Yes Penile pain?: No  Gastrointestinal Nausea?: No Vomiting?: No Indigestion/heartburn?: No Diarrhea?: No Constipation?: No  Constitutional Fever: No Night sweats?: No Weight loss?: No Fatigue?: No  Skin Skin rash/lesions?: No Itching?: No  Eyes Blurred vision?: No Double vision?: No  Ears/Nose/Throat Sore throat?: No Sinus problems?: Yes  Hematologic/Lymphatic Swollen glands?: No Easy bruising?: No  Cardiovascular Leg swelling?: No Chest  pain?: No  Respiratory Cough?: No Shortness of breath?: No  Endocrine Excessive thirst?: No  Musculoskeletal Back pain?: No Joint pain?: No  Neurological Headaches?: No Dizziness?: Yes  Psychologic Depression?: No Anxiety?: No  Physical Exam: BP (!) 160/83   Pulse (!) 51   Ht 5\' 10"  (1.778 m)   Wt 166 lb (75.3 kg)   BMI 23.82 kg/m   Constitutional:  Alert and oriented, No acute distress.   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  His PSA has remained undetectable.  Will have him follow-up as needed.     Return if symptoms worsen or fail to improve.  Abbie Sons, [place] [address], [place], [place] [address] [phone]

## 2017-11-05 ENCOUNTER — Encounter: Payer: Self-pay | Admitting: Gastroenterology

## 2017-11-05 ENCOUNTER — Other Ambulatory Visit: Payer: Self-pay

## 2017-11-05 ENCOUNTER — Ambulatory Visit (AMBULATORY_SURGERY_CENTER): Payer: Self-pay | Admitting: *Deleted

## 2017-11-05 VITALS — Ht 70.5 in | Wt 170.0 lb

## 2017-11-05 DIAGNOSIS — Z860100 Personal history of colon polyps, unspecified: Secondary | ICD-10-CM

## 2017-11-05 MED ORDER — NA SULFATE-K SULFATE-MG SULF 17.5-3.13-1.6 GM/177ML PO SOLN: 1.0000 | Freq: Once | ORAL | 0 refills | Status: AC

## 2017-11-05 NOTE — Progress Notes (Signed)
No egg or soy allergy known to patient   issues with past sedation with any surgeries  or procedures PONV with one colon , no intubation problems  No diet pills per patient No home 02 use per patient  No blood thinners per patient  Pt denies issues with constipation  No A fib or A flutter  EMMI video sent to pt's e mail - pt declined

## 2017-11-19 ENCOUNTER — Ambulatory Visit (AMBULATORY_SURGERY_CENTER): Payer: Medicare Other | Admitting: Gastroenterology

## 2017-11-19 ENCOUNTER — Encounter: Payer: Self-pay | Admitting: Gastroenterology

## 2017-11-19 ENCOUNTER — Other Ambulatory Visit: Payer: Self-pay

## 2017-11-19 VITALS — BP 125/54 | HR 73 | Temp 99.3°F | Resp 16 | Ht 70.0 in | Wt 170.0 lb

## 2017-11-19 DIAGNOSIS — Z86010 Personal history of colonic polyps: Secondary | ICD-10-CM

## 2017-11-19 DIAGNOSIS — D122 Benign neoplasm of ascending colon: Secondary | ICD-10-CM | POA: Diagnosis not present

## 2017-11-19 MED ORDER — SODIUM CHLORIDE 0.9 % IV SOLN: 500.0000 mL | INTRAVENOUS | Status: DC

## 2017-11-19 NOTE — Progress Notes (Signed)
Report given to PACU, vss 

## 2017-11-19 NOTE — Op Note (Signed)
[facility] Patient Name: Ray Fowler Procedure Date: 11/19/2017 7:58 AM MRN: [id number] Endoscopist: Ladene Artist , MD Age: 77 Referring MD:  Date of Birth: [birth year] Gender: Male Account #: [id number] Procedure:                Colonoscopy Indications:              Surveillance: Personal history of adenomatous                            polyps on last colonoscopy 5 years ago Medicines:                Monitored Anesthesia Care Procedure:                Pre-Anesthesia Assessment:                           - Prior to the procedure, a History and Physical                            was performed, and patient medications and                            allergies were reviewed. The patient's tolerance of                            previous anesthesia was also reviewed. The risks                            and benefits of the procedure and the sedation                            options and risks were discussed with the patient.                            All questions were answered, and informed consent                            was obtained. Prior Anticoagulants: The patient has                            taken no previous anticoagulant or antiplatelet                            agents. ASA Grade Assessment: II - A patient with                            mild systemic disease. After reviewing the risks                            and benefits, the patient was deemed in                            satisfactory condition to undergo the procedure.  After obtaining informed consent, the colonoscope                            was passed under direct vision. Throughout the                            procedure, the patient's blood pressure, pulse, and                            oxygen saturations were monitored continuously. The                            Colonoscope was introduced through the anus and                            advanced to the the cecum,  identified by                            appendiceal orifice and ileocecal valve. The                            ileocecal valve, appendiceal orifice, and rectum                            were photographed. The quality of the bowel                            preparation was excellent. The colonoscopy was                            performed without difficulty. The patient tolerated                            the procedure well. Scope In: 8:06:59 AM Scope Out: 8:18:35 AM Scope Withdrawal Time: 0 hours 9 minutes 34 seconds  Total Procedure Duration: 0 hours 11 minutes 36 seconds  Findings:                 The perianal and digital rectal examinations were                            normal.                           Three sessile polyps were found in the ascending                            colon. The polyps were 2 to 3 mm in size. These                            polyps were removed with a cold biopsy forceps.                            Resection and retrieval were complete.  A single small localized angiodysplastic lesion                            without bleeding was found in the rectum. Small                            phlebectasias found in the rectum.                           Multiple large-mouthed diverticula were found in                            the left colon.                           Internal hemorrhoids were found during                            retroflexion. The hemorrhoids were small and Grade                            I (internal hemorrhoids that do not prolapse).                           The exam was otherwise without abnormality on                            direct and retroflexion views. Complications:            No immediate complications. Estimated blood loss:                            None. Estimated Blood Loss:     Estimated blood loss: none. Impression:               - Three 2 to 3 mm polyps in the ascending colon,                             removed with a cold biopsy forceps. Resected and                            retrieved.                           - A single non-bleeding colonic angiodysplastic                            lesion.                           - Diverticulosis in the left colon.                           - Internal hemorrhoids.                           - The examination was otherwise normal on direct  and retroflexion views. Recommendation:           - Patient has a contact number available for                            emergencies. The signs and symptoms of potential                            delayed complications were discussed with the                            patient. Return to normal activities tomorrow.                            Written discharge instructions were provided to the                            patient.                           - Resume previous diet.                           - Continue present medications.                           - Await pathology results.                           - No repeat colonoscopy due to age. Ladene Artist, MD 11/19/2017 8:24:32 AM This report has been signed electronically.

## 2017-11-19 NOTE — Patient Instructions (Signed)
YOU HAD AN ENDOSCOPIC PROCEDURE TODAY AT THE [facility]:   Refer to the procedure report that was given to you for any specific questions about what was found during the examination.  If the procedure report does not answer your questions, please call your gastroenterologist to clarify.  If you requested that your care partner not be given the details of your procedure findings, then the procedure report has been included in a sealed envelope for you to review at your convenience later.  YOU SHOULD EXPECT: Some feelings of bloating in the abdomen. Passage of more gas than usual.  Walking can help get rid of the air that was put into your GI tract during the procedure and reduce the bloating. If you had a lower endoscopy (such as a colonoscopy or flexible sigmoidoscopy) you may notice spotting of blood in your stool or on the toilet paper. If you underwent a bowel prep for your procedure, you may not have a normal bowel movement for a few days.  Please Note:  You might notice some irritation and congestion in your nose or some drainage.  This is from the oxygen used during your procedure.  There is no need for concern and it should clear up in a day or so.  SYMPTOMS TO REPORT IMMEDIATELY:   Following lower endoscopy (colonoscopy or flexible sigmoidoscopy):  Excessive amounts of blood in the stool  Significant tenderness or worsening of abdominal pains  Swelling of the abdomen that is new, acute  Fever of 100F or higher  For urgent or emergent issues, a gastroenterologist can be reached at any hour by calling [phone].   DIET:  We do recommend a small meal at first, but then you may proceed to your regular diet.  Drink plenty of fluids but you should avoid alcoholic beverages for 24 hours.  ACTIVITY:  You should plan to take it easy for the rest of today and you should NOT DRIVE or use heavy machinery until tomorrow (because of the sedation medicines used during the test).     FOLLOW UP: Our staff will call the number listed on your records the next business day following your procedure to check on you and address any questions or concerns that you may have regarding the information given to you following your procedure. If we do not reach you, we will leave a message.  However, if you are feeling well and you are not experiencing any problems, there is no need to return our call.  We will assume that you have returned to your regular daily activities without incident.  If any biopsies were taken you will be contacted by phone or by letter within the next 1-3 weeks.  Please call us at [phone] if you have not heard about the biopsies in 3 weeks.    SIGNATURES/CONFIDENTIALITY: You and/or your care partner have signed paperwork which will be entered into your electronic medical record.  These signatures attest to the fact that that the information above on your After Visit Summary has been reviewed and is understood.  Full responsibility of the confidentiality of this discharge information lies with you and/or your care-partner.    Handouts were given to your care partner on polyps, diverticulosis, hemorrhoids and a high fiber diet with liberal fluid intake. You may resume your current medications today. Await biopsy results. Please call if any questions or concerns.   

## 2017-11-19 NOTE — Progress Notes (Signed)
Called to room to assist during endoscopic procedure.  Patient ID and intended procedure confirmed with present staff. Received instructions for my participation in the procedure from the performing physician.  

## 2017-11-19 NOTE — Progress Notes (Signed)
No problems noted in the recovery room. maw 

## 2017-11-20 ENCOUNTER — Telehealth: Payer: Self-pay

## 2017-11-20 NOTE — Telephone Encounter (Signed)
  Follow up Call-  Call back number 11/19/2017  Post procedure Call Back phone  # [phone]  Permission to leave phone message Yes  Some recent data might be hidden     Patient questions:  Do you have a fever, pain , or abdominal swelling? No. Pain Score  0 *  Have you tolerated food without any problems? Yes.    Have you been able to return to your normal activities? Yes.    Do you have any questions about your discharge instructions: Diet   No. Medications  No. Follow up visit  No.  Do you have questions or concerns about your Care? No.  Actions: * If pain score is 4 or above: No action needed, pain <4.  No problems noted per pt. maw

## 2017-11-23 ENCOUNTER — Encounter: Payer: Self-pay | Admitting: Family Medicine

## 2017-11-29 ENCOUNTER — Encounter: Payer: Self-pay | Admitting: Gastroenterology

## 2017-12-03 ENCOUNTER — Encounter: Payer: Self-pay | Admitting: Family Medicine

## 2017-12-03 ENCOUNTER — Ambulatory Visit: Payer: Medicare Other | Admitting: Family Medicine

## 2017-12-03 VITALS — BP 126/76 | HR 71 | Temp 98.3°F | Ht 70.0 in | Wt 171.0 lb

## 2017-12-03 DIAGNOSIS — K4090 Unilateral inguinal hernia, without obstruction or gangrene, not specified as recurrent: Secondary | ICD-10-CM

## 2017-12-03 NOTE — Patient Instructions (Signed)
We will call about your referral.  Ray Fowler or Ray Fowler will call you if you don't see one of them on the way out.  Take care.  Glad to see you.  Limit heavy lifting in the meantime.   If you have severe pain then go to the ER.

## 2017-12-03 NOTE — Progress Notes (Signed)
R abd pain.  Noted after colonoscopy.  Doesn't note it when supine but sees a bulge when upright.  He can reduce the bulge.  Tender when upright, still doing yardwork, but tolerable.  Dull toothache pain.  More activity causes more pain.  No L sided pain.  No FCNAVD.  No blood in stool.  Tender in RLQ with cough/laugh/sneeze.    Meds, vitals, and allergies reviewed.   ROS: Per HPI unless specifically indicated in ROS section   nad ncat rrr ctab abd soft but with soft RIH noted.  Benign abd o/w.  Normal BS No BLE edema.

## 2017-12-04 DIAGNOSIS — K4090 Unilateral inguinal hernia, without obstruction or gangrene, not specified as recurrent: Secondary | ICD-10-CM | POA: Insufficient documentation

## 2017-12-04 NOTE — Assessment & Plan Note (Signed)
Right inguinal hernia noted.  Anatomy discussed with patient.  Okay for outpatient follow-up.  I would have him see the general surgeons to get their opinion on his situation.  He does have history of penile implant with hardware on the right side.  I do not think this is related to the colonoscopy.  I think he incidentally noted after the colonoscopy, but I cannot imagine how the colonoscopy itself would have caused his troubles.  Discussed the patient.  Routine cautions given.  Update me as needed.  Limit activity such as straining or heavy lifting that causes pain.  He agrees.

## 2017-12-09 ENCOUNTER — Other Ambulatory Visit: Payer: Self-pay | Admitting: Cardiovascular Disease

## 2017-12-09 NOTE — Telephone Encounter (Signed)
Spoke with Patient.  Let him know I received a refill for his Bystolic 10mg  BID from [facility] with a note "PATIENT WOULD LIKE 20MG  TABLETS 1 TABLET DAILY INSTEAD OF10MG  TABLET 2 TABLETS DAILY. THANKS."  Ask to make sure that is what he wanted me to send in for him and he stated " he has been taking 2 10MG  tablets in the morning for about 3 weeks now and everything has been working out good so he would like just 20MG  tablets to be sent in so he only has to take 1 pill daily."  I told him I would change the dose and send the 20MG  tablets in and he would only need to take that once a day now that he will be getting 20MG  tablets.   Requested Prescriptions   Signed Prescriptions Disp Refills  . Nebivolol HCl 20 MG TABS 180 tablet 0    Sig: Take 1 tablet (20 mg total) by mouth daily.    Authorizing Provider: Minna Merritts    Ordering User: Janan Ridge

## 2017-12-13 ENCOUNTER — Telehealth: Payer: Self-pay | Admitting: Cardiovascular Disease

## 2017-12-13 NOTE — Telephone Encounter (Signed)
° °  [facility] Pre-operative Risk Assessment    Request for surgical clearance:  1. What type of surgery is being performed? Inguinal hernia Surgery    2. When is this surgery scheduled? Not scheduled yet   3. What type of clearance is required (medical clearance vs. Pharmacy clearance to hold med vs. Both)? Both   4. Are there any medications that need to be held prior to surgery and how long? Not listed    5. Practice name and name of physician performing surgery? [facility] Dr Greer Pickerel   6. What is your office phone number [phone]   7.   What is your office fax number [phone]  8.   Anesthesia type (None, local, MAC, general) ? General

## 2017-12-13 NOTE — Telephone Encounter (Signed)
Patient last seen by Dr Rockey Situ on 04/03/18 and f/u 1 year. Called patient to get update on his status. Patient out mowing. S/w wife, ok per DPR, she said he denies any new or recent chest pain or shortness of breath. Routing to Dr Rockey Situ for clearance.

## 2017-12-15 NOTE — Telephone Encounter (Signed)
If no changes, Should be acceptable risk to proceed Would monitor BP before procedure No further testing needed thx TG

## 2017-12-16 NOTE — Telephone Encounter (Signed)
.     I will route this recommendation to the requesting party via Epic fax function and remove from pre-op pool.  Please call with questions.  [place], [place] 12/16/2017, 3:00 PM

## 2017-12-17 NOTE — Telephone Encounter (Signed)
Routed clearance to fax number

## 2017-12-18 ENCOUNTER — Ambulatory Visit: Payer: Self-pay | Admitting: General Surgery

## 2017-12-19 NOTE — Patient Instructions (Signed)
Ray Fowler  12/19/2017   Your procedure is scheduled on: 12-24-17  Report to [facility] Main  Entrance  Report to admitting at     0530 AM    Call this number if you have problems the morning of surgery [phone]   Remember: Do not eat food or drink liquids :After Midnight.     Take these medicines the morning of surgery with A SIP OF WATER: nebivolol, amlodipine, inhalers and bring                                You may not have any metal on your body including hair pins and              piercings  Do not wear jewelry, make-up, lotions, powders or perfumes, deodorant             Do not wear nail polish.  Do not shave  48 hours prior to surgery.                 Do not bring valuables to the hospital. [place].  Contacts, dentures or bridgework may not be worn into surgery.      Patients discharged the day of surgery will not be allowed to drive home.  Name and phone number of your driver:  Special Instructions: N/A              Please read over the following fact sheets you were given: ____________________________________________________________________           [facility] - Preparing for Surgery Before surgery, you can play an important role.  Because skin is not sterile, your skin needs to be as free of germs as possible.  You can reduce the number of germs on your skin by washing with CHG (chlorahexidine gluconate) soap before surgery.  CHG is an antiseptic cleaner which kills germs and bonds with the skin to continue killing germs even after washing. Please DO NOT use if you have an allergy to CHG or antibacterial soaps.  If your skin becomes reddened/irritated stop using the CHG and inform your nurse when you arrive at Short Stay. Do not shave (including legs and underarms) for at least 48 hours prior to the first CHG shower.  You may shave your face/neck. Please follow these instructions  carefully:  1.  Shower with CHG Soap the night before surgery and the  morning of Surgery.  2.  If you choose to wash your hair, wash your hair first as usual with your  normal  shampoo.  3.  After you shampoo, rinse your hair and body thoroughly to remove the  shampoo.                           4.  Use CHG as you would any other liquid soap.  You can apply chg directly  to the skin and wash                       Gently with a scrungie or clean washcloth.  5.  Apply the CHG Soap to your body ONLY FROM THE NECK DOWN.   Do not use  on face/ open                           Wound or open sores. Avoid contact with eyes, ears mouth and genitals (private parts).                       Wash face,  Genitals (private parts) with your normal soap.             6.  Wash thoroughly, paying special attention to the area where your surgery  will be performed.  7.  Thoroughly rinse your body with warm water from the neck down.  8.  DO NOT shower/wash with your normal soap after using and rinsing off  the CHG Soap.                9.  Pat yourself dry with a clean towel.            10.  Wear clean pajamas.            11.  Place clean sheets on your bed the night of your first shower and do not  sleep with pets. Day of Surgery : Do not apply any lotions/deodorants the morning of surgery.  Please wear clean clothes to the hospital/surgery center.  FAILURE TO FOLLOW THESE INSTRUCTIONS MAY RESULT IN THE CANCELLATION OF YOUR SURGERY PATIENT SIGNATURE_________________________________  NURSE SIGNATURE__________________________________  ________________________________________________________________________

## 2017-12-20 ENCOUNTER — Encounter (HOSPITAL_COMMUNITY)
Admission: RE | Admit: 2017-12-20 | Discharge: 2017-12-20 | Disposition: A | Discharge status: Home / Self Care | Payer: Medicare Other | Source: Ambulatory Visit | Attending: General Surgery | Admitting: General Surgery

## 2017-12-20 ENCOUNTER — Other Ambulatory Visit: Payer: Self-pay

## 2017-12-20 ENCOUNTER — Encounter (HOSPITAL_COMMUNITY): Payer: Self-pay

## 2017-12-20 DIAGNOSIS — Z01818 Encounter for other preprocedural examination: Secondary | ICD-10-CM | POA: Diagnosis not present

## 2017-12-20 DIAGNOSIS — K4090 Unilateral inguinal hernia, without obstruction or gangrene, not specified as recurrent: Secondary | ICD-10-CM | POA: Insufficient documentation

## 2017-12-20 HISTORY — DX: Other specified postprocedural states: Z98.890

## 2017-12-20 HISTORY — DX: Unspecified osteoarthritis, unspecified site: M19.90

## 2017-12-20 HISTORY — DX: Other specified postprocedural states: R11.2

## 2017-12-20 HISTORY — DX: Other complications of anesthesia, initial encounter: T88.59XA

## 2017-12-20 HISTORY — DX: Adverse effect of unspecified anesthetic, initial encounter: T41.45XA

## 2017-12-20 HISTORY — DX: Family history of other specified conditions: Z84.89

## 2017-12-20 HISTORY — DX: Personal history of urinary calculi: Z87.442

## 2017-12-20 LAB — BASIC METABOLIC PANEL
Anion gap: 12 (ref 5–15)
BUN: 22 mg/dL — AB (ref 6–20)
CALCIUM: 9.6 mg/dL (ref 8.9–10.3)
CO2: 28 mmol/L (ref 22–32)
CREATININE: 1.01 mg/dL (ref 0.61–1.24)
Chloride: 99 mmol/L — ABNORMAL LOW (ref 101–111)
GFR calc Af Amer: >60 mL/min (ref >60)
GLUCOSE: 102 mg/dL — AB (ref 65–99)
Potassium: 3.7 mmol/L (ref 3.5–5.1)
Sodium: 139 mmol/L (ref 135–145)

## 2017-12-20 LAB — CBC WITH DIFFERENTIAL/PLATELET
BASOS ABS: 0 10*3/uL (ref 0.0–0.1)
Basophils Relative: 0 %
Eosinophils Absolute: 0.2 10*3/uL (ref 0.0–0.7)
Eosinophils Relative: 3 %
HEMATOCRIT: 47.1 % (ref 39.0–52.0)
HEMOGLOBIN: 15.4 g/dL (ref 13.0–17.0)
LYMPHS PCT: 24 %
Lymphs Abs: 1.8 10*3/uL (ref 0.7–4.0)
MCH: 30 pg (ref 26.0–34.0)
MCHC: 32.7 g/dL (ref 30.0–36.0)
MCV: 91.8 fL (ref 78.0–100.0)
MONO ABS: 0.9 10*3/uL (ref 0.1–1.0)
Monocytes Relative: 12 %
Neutro Abs: 4.4 10*3/uL (ref 1.7–7.7)
Neutrophils Relative %: 61 %
Platelets: 146 10*3/uL — ABNORMAL LOW (ref 150–400)
RBC: 5.13 MIL/uL (ref 4.22–5.81)
RDW: 13.3 % (ref 11.5–15.5)
WBC: 7.2 10*3/uL (ref 4.0–10.5)

## 2017-12-20 NOTE — Progress Notes (Signed)
Clearance 12-15-17 telephone note epic Dr. Rockey Situ  Echo 10-18-15 epic  cxr epic  ekg 04-03-18 epic

## 2017-12-23 NOTE — Anesthesia Preprocedure Evaluation (Addendum)
Anesthesia Evaluation  Patient identified by MRN, date of birth, ID band Patient awake    Reviewed: Allergy & Precautions, NPO status , Patient's Chart, lab work & pertinent test results, reviewed documented beta blocker date and time   History of Anesthesia Complications (+) PONV, Family history of anesthesia reaction and history of anesthetic complications  Airway Mallampati: II  TM Distance: >3 FB Neck ROM: Full    Dental  (+) Teeth Intact   Pulmonary pneumonia, COPD, former smoker,    Pulmonary exam normal breath sounds clear to auscultation       Cardiovascular hypertension, Pt. on home beta blockers and Pt. on medications + CAD and + Peripheral Vascular Disease  Normal cardiovascular exam+ dysrhythmias  Rhythm:Regular Rate:Normal + Systolic murmurs Echo 02/3418 - Left ventricle: The cavity size was mildly dilated. Systolic   function was mildly reduced. The estimated ejection fraction was in the range of 45% to 50%. Images were inadequate for LV wall motion assessment. Doppler parameters are consistent with  abnormal left ventricular relaxation (grade 1 diastolic  dysfunction). - Aortic valve: There was trivial regurgitation. - Mitral valve: There was mild regurgitation. - Left atrium: The atrium was mildly dilated.  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   Neuro/Psych negative neurological ROS  negative psych ROS   GI/Hepatic negative GI ROS, Neg liver ROS,   Endo/Other  negative endocrine ROS  Renal/GU negative Renal ROS     Musculoskeletal negative musculoskeletal ROS (+) Arthritis ,   Abdominal   Peds  Hematology negative hematology ROS (+)   Anesthesia Other Findings   Reproductive/Obstetrics negative OB ROS                            Anesthesia Physical Anesthesia Plan  ASA: III  Anesthesia Plan: General   Post-op Pain Management:     Induction: Intravenous  PONV Risk Score and Plan: 4 or greater and Ondansetron, Dexamethasone and Treatment may vary due to age or medical condition  Airway Management Planned: LMA and Oral ETT  Additional Equipment:   Intra-op Plan:   Post-operative Plan: Extubation in OR  Informed Consent: I have reviewed the patients History and Physical, chart, labs and discussed the procedure including the risks, benefits and alternatives for the proposed anesthesia with the patient or authorized representative who has indicated his/her understanding and acceptance.   Dental advisory given  Plan Discussed with: CRNA  Anesthesia Plan Comments:         Anesthesia Quick Evaluation

## 2017-12-24 ENCOUNTER — Ambulatory Visit (HOSPITAL_COMMUNITY)
Admission: RE | Admit: 2017-12-24 | Discharge: 2017-12-24 | Disposition: A | Discharge status: Home / Self Care | Payer: Medicare Other | Source: Ambulatory Visit | Attending: General Surgery | Admitting: General Surgery

## 2017-12-24 ENCOUNTER — Ambulatory Visit (HOSPITAL_COMMUNITY): Payer: Medicare Other | Admitting: Anesthesiology

## 2017-12-24 ENCOUNTER — Encounter (HOSPITAL_COMMUNITY): Payer: Self-pay

## 2017-12-24 ENCOUNTER — Encounter (HOSPITAL_COMMUNITY)
Admission: RE | Disposition: A | Discharge status: Home / Self Care | Payer: Self-pay | Source: Ambulatory Visit | Attending: General Surgery

## 2017-12-24 DIAGNOSIS — E7800 Pure hypercholesterolemia, unspecified: Secondary | ICD-10-CM | POA: Diagnosis not present

## 2017-12-24 DIAGNOSIS — Z8546 Personal history of malignant neoplasm of prostate: Secondary | ICD-10-CM | POA: Diagnosis not present

## 2017-12-24 DIAGNOSIS — Z9981 Dependence on supplemental oxygen: Secondary | ICD-10-CM | POA: Insufficient documentation

## 2017-12-24 DIAGNOSIS — I2510 Atherosclerotic heart disease of native coronary artery without angina pectoris: Secondary | ICD-10-CM | POA: Insufficient documentation

## 2017-12-24 DIAGNOSIS — I10 Essential (primary) hypertension: Secondary | ICD-10-CM | POA: Insufficient documentation

## 2017-12-24 DIAGNOSIS — K4090 Unilateral inguinal hernia, without obstruction or gangrene, not specified as recurrent: Secondary | ICD-10-CM | POA: Insufficient documentation

## 2017-12-24 DIAGNOSIS — J449 Chronic obstructive pulmonary disease, unspecified: Secondary | ICD-10-CM | POA: Insufficient documentation

## 2017-12-24 DIAGNOSIS — Z79899 Other long term (current) drug therapy: Secondary | ICD-10-CM | POA: Diagnosis not present

## 2017-12-24 DIAGNOSIS — Z87891 Personal history of nicotine dependence: Secondary | ICD-10-CM | POA: Diagnosis not present

## 2017-12-24 DIAGNOSIS — I739 Peripheral vascular disease, unspecified: Secondary | ICD-10-CM | POA: Insufficient documentation

## 2017-12-24 DIAGNOSIS — Z85820 Personal history of malignant melanoma of skin: Secondary | ICD-10-CM | POA: Insufficient documentation

## 2017-12-24 HISTORY — PX: INGUINAL HERNIA REPAIR: SHX194

## 2017-12-24 HISTORY — PX: INSERTION OF MESH: SHX5868

## 2017-12-24 SURGERY — INSERTION OF MESH
Anesthesia: General | Laterality: Right

## 2017-12-24 MED ORDER — FENTANYL CITRATE (PF) 250 MCG/5ML IJ SOLN
INTRAMUSCULAR | Status: AC
  Filled 2017-12-24: qty 5

## 2017-12-24 MED ORDER — CEFAZOLIN SODIUM-DEXTROSE 2-4 GM/100ML-% IV SOLN
2.0000 g | INTRAVENOUS | Status: AC
  Administered 2017-12-24: 2 g via INTRAVENOUS
  Filled 2017-12-24: qty 100

## 2017-12-24 MED ORDER — HYDROMORPHONE HCL 1 MG/ML IJ SOLN: 0.2500 mg | INTRAMUSCULAR | Status: DC | PRN

## 2017-12-24 MED ORDER — MEPERIDINE HCL 50 MG/ML IJ SOLN: 6.2500 mg | INTRAMUSCULAR | Status: DC | PRN

## 2017-12-24 MED ORDER — ONDANSETRON HCL 4 MG/2ML IJ SOLN
INTRAMUSCULAR | Status: AC
  Filled 2017-12-24: qty 2

## 2017-12-24 MED ORDER — GABAPENTIN 300 MG PO CAPS
ORAL_CAPSULE | ORAL | Status: AC
  Filled 2017-12-24: qty 1

## 2017-12-24 MED ORDER — SUGAMMADEX SODIUM 500 MG/5ML IV SOLN: INTRAVENOUS | Status: DC | PRN

## 2017-12-24 MED ORDER — LIDOCAINE 2% (20 MG/ML) 5 ML SYRINGE
INTRAMUSCULAR | Status: DC | PRN
  Administered 2017-12-24: 1.5 mg/kg/h via INTRAVENOUS

## 2017-12-24 MED ORDER — DIPHENHYDRAMINE HCL 50 MG/ML IJ SOLN
INTRAMUSCULAR | Status: DC | PRN
  Administered 2017-12-24: 12.5 mg via INTRAVENOUS

## 2017-12-24 MED ORDER — PROPOFOL 10 MG/ML IV BOLUS
INTRAVENOUS | Status: AC
  Filled 2017-12-24: qty 20

## 2017-12-24 MED ORDER — METOCLOPRAMIDE HCL 5 MG/ML IJ SOLN
INTRAMUSCULAR | Status: AC
  Filled 2017-12-24: qty 2

## 2017-12-24 MED ORDER — PROPOFOL 10 MG/ML IV BOLUS
INTRAVENOUS | Status: DC | PRN
  Administered 2017-12-24: 130 mg via INTRAVENOUS

## 2017-12-24 MED ORDER — DEXAMETHASONE SODIUM PHOSPHATE 10 MG/ML IJ SOLN
INTRAMUSCULAR | Status: AC
  Filled 2017-12-24: qty 1

## 2017-12-24 MED ORDER — BUPIVACAINE HCL (PF) 0.25 % IJ SOLN
INTRAMUSCULAR | Status: DC | PRN
  Administered 2017-12-24: 30 mL

## 2017-12-24 MED ORDER — ROCURONIUM BROMIDE 50 MG/5ML IV SOSY
PREFILLED_SYRINGE | INTRAVENOUS | Status: DC | PRN
  Administered 2017-12-24: 50 mg via INTRAVENOUS
  Administered 2017-12-24: 10 mg via INTRAVENOUS

## 2017-12-24 MED ORDER — CHLORHEXIDINE GLUCONATE 4 % EX LIQD: 60.0000 mL | Freq: Once | CUTANEOUS | Status: DC

## 2017-12-24 MED ORDER — KETAMINE HCL 10 MG/ML IJ SOLN
INTRAMUSCULAR | Status: AC
  Filled 2017-12-24: qty 1

## 2017-12-24 MED ORDER — PROMETHAZINE HCL 25 MG/ML IJ SOLN
INTRAMUSCULAR | Status: AC
  Filled 2017-12-24: qty 1

## 2017-12-24 MED ORDER — 0.9 % SODIUM CHLORIDE (POUR BTL) OPTIME
TOPICAL | Status: DC | PRN
  Administered 2017-12-24: 1000 mL

## 2017-12-24 MED ORDER — SUGAMMADEX SODIUM 500 MG/5ML IV SOLN
INTRAVENOUS | Status: DC | PRN
  Administered 2017-12-24: 300 mg via INTRAVENOUS

## 2017-12-24 MED ORDER — LIDOCAINE 2% (20 MG/ML) 5 ML SYRINGE
INTRAMUSCULAR | Status: DC | PRN
  Administered 2017-12-24: 100 mg via INTRAVENOUS

## 2017-12-24 MED ORDER — BUPIVACAINE HCL (PF) 0.25 % IJ SOLN
INTRAMUSCULAR | Status: AC
  Filled 2017-12-24: qty 30

## 2017-12-24 MED ORDER — EPHEDRINE 5 MG/ML INJ
INTRAVENOUS | Status: AC
  Filled 2017-12-24: qty 10

## 2017-12-24 MED ORDER — ACETAMINOPHEN 500 MG PO TABS
1000.0000 mg | ORAL_TABLET | ORAL | Status: AC
  Administered 2017-12-24: 1000 mg via ORAL

## 2017-12-24 MED ORDER — OXYCODONE HCL 5 MG PO TABS: 5.0000 mg | ORAL_TABLET | Freq: Once | ORAL | Status: DC | PRN

## 2017-12-24 MED ORDER — ONDANSETRON HCL 4 MG/2ML IJ SOLN
INTRAMUSCULAR | Status: DC | PRN
  Administered 2017-12-24: 4 mg via INTRAVENOUS

## 2017-12-24 MED ORDER — METOCLOPRAMIDE HCL 5 MG/ML IJ SOLN
INTRAMUSCULAR | Status: DC | PRN
  Administered 2017-12-24: 10 mg via INTRAVENOUS

## 2017-12-24 MED ORDER — MIDAZOLAM HCL 2 MG/2ML IJ SOLN
INTRAMUSCULAR | Status: AC
  Filled 2017-12-24: qty 2

## 2017-12-24 MED ORDER — EPHEDRINE SULFATE-NACL 50-0.9 MG/10ML-% IV SOSY
PREFILLED_SYRINGE | INTRAVENOUS | Status: DC | PRN
  Administered 2017-12-24: 5 mg via INTRAVENOUS
  Administered 2017-12-24: 15 mg via INTRAVENOUS
  Administered 2017-12-24: 5 mg via INTRAVENOUS
  Administered 2017-12-24: 10 mg via INTRAVENOUS

## 2017-12-24 MED ORDER — PHENYLEPHRINE 40 MCG/ML (10ML) SYRINGE FOR IV PUSH (FOR BLOOD PRESSURE SUPPORT)
PREFILLED_SYRINGE | INTRAVENOUS | Status: DC | PRN
  Administered 2017-12-24 (×2): 80 ug via INTRAVENOUS
  Administered 2017-12-24: 40 ug via INTRAVENOUS

## 2017-12-24 MED ORDER — LACTATED RINGERS IV SOLN
INTRAVENOUS | Status: DC
  Administered 2017-12-24: 1000 mL via INTRAVENOUS
  Administered 2017-12-24: 09:00:00 via INTRAVENOUS

## 2017-12-24 MED ORDER — GABAPENTIN 300 MG PO CAPS
300.0000 mg | ORAL_CAPSULE | ORAL | Status: AC
  Administered 2017-12-24: 300 mg via ORAL

## 2017-12-24 MED ORDER — DEXAMETHASONE SODIUM PHOSPHATE 4 MG/ML IJ SOLN
INTRAMUSCULAR | Status: DC | PRN
  Administered 2017-12-24: 10 mg via INTRAVENOUS

## 2017-12-24 MED ORDER — PROMETHAZINE HCL 25 MG/ML IJ SOLN
6.2500 mg | INTRAMUSCULAR | Status: DC | PRN
  Administered 2017-12-24: 6.25 mg via INTRAVENOUS

## 2017-12-24 MED ORDER — FENTANYL CITRATE (PF) 100 MCG/2ML IJ SOLN
INTRAMUSCULAR | Status: DC | PRN
  Administered 2017-12-24 (×2): 50 ug via INTRAVENOUS

## 2017-12-24 MED ORDER — OXYCODONE HCL 5 MG PO TABS: 5.0000 mg | ORAL_TABLET | Freq: Four times a day (QID) | ORAL | 0 refills | Status: DC | PRN

## 2017-12-24 MED ORDER — BUPIVACAINE LIPOSOME 1.3 % IJ SUSP
20.0000 mL | Freq: Once | INTRAMUSCULAR | Status: AC
  Administered 2017-12-24: 20 mL
  Filled 2017-12-24: qty 20

## 2017-12-24 MED ORDER — BUPIVACAINE-EPINEPHRINE (PF) 0.5% -1:200000 IJ SOLN
INTRAMUSCULAR | Status: AC
  Filled 2017-12-24: qty 30

## 2017-12-24 MED ORDER — ACETAMINOPHEN 500 MG PO TABS
ORAL_TABLET | ORAL | Status: AC
  Filled 2017-12-24: qty 2

## 2017-12-24 MED ORDER — LIDOCAINE 2% (20 MG/ML) 5 ML SYRINGE
INTRAMUSCULAR | Status: AC
  Filled 2017-12-24: qty 10

## 2017-12-24 MED ORDER — PROPOFOL 500 MG/50ML IV EMUL
INTRAVENOUS | Status: DC | PRN
  Administered 2017-12-24: 10 ug/kg/min via INTRAVENOUS

## 2017-12-24 MED ORDER — OXYCODONE HCL 5 MG/5ML PO SOLN: 5.0000 mg | Freq: Once | ORAL | Status: DC | PRN

## 2017-12-24 MED ORDER — ROCURONIUM BROMIDE 10 MG/ML (PF) SYRINGE
PREFILLED_SYRINGE | INTRAVENOUS | Status: AC
  Filled 2017-12-24: qty 5

## 2017-12-24 MED ORDER — MIDAZOLAM HCL 5 MG/5ML IJ SOLN
INTRAMUSCULAR | Status: DC | PRN
  Administered 2017-12-24: 1 mg via INTRAVENOUS

## 2017-12-24 SURGICAL SUPPLY — 47 items
ADH SKN CLS APL DERMABOND .7 (GAUZE/BANDAGES/DRESSINGS) ×1
APL SKNCLS STERI-STRIP NONHPOA (GAUZE/BANDAGES/DRESSINGS)
APPLIER CLIP 5 13 M/L LIGAMAX5 (MISCELLANEOUS)
APR CLP MED LRG 5 ANG JAW (MISCELLANEOUS)
BANDAGE ADH SHEER 1  50/CT (GAUZE/BANDAGES/DRESSINGS) IMPLANT
BENZOIN TINCTURE PRP APPL 2/3 (GAUZE/BANDAGES/DRESSINGS) ×1 IMPLANT
CABLE HIGH FREQUENCY MONO STRZ (ELECTRODE) ×3 IMPLANT
CHLORAPREP W/TINT 26ML (MISCELLANEOUS) ×3 IMPLANT
CLIP APPLIE 5 13 M/L LIGAMAX5 (MISCELLANEOUS) IMPLANT
CLOSURE WOUND 1/2 X4 (GAUZE/BANDAGES/DRESSINGS)
COVER SURGICAL LIGHT HANDLE (MISCELLANEOUS) ×3 IMPLANT
DECANTER SPIKE VIAL GLASS SM (MISCELLANEOUS) ×3 IMPLANT
DERMABOND ADVANCED (GAUZE/BANDAGES/DRESSINGS) ×2
DERMABOND ADVANCED .7 DNX12 (GAUZE/BANDAGES/DRESSINGS) ×1 IMPLANT
DEVICE SECURE STRAP 25 ABSORB (INSTRUMENTS) IMPLANT
DRAIN PENROSE 18X1/2 LTX STRL (DRAIN) ×2 IMPLANT
DRSG TEGADERM 2-3/8X2-3/4 SM (GAUZE/BANDAGES/DRESSINGS) IMPLANT
DRSG TEGADERM 4X4.75 (GAUZE/BANDAGES/DRESSINGS) IMPLANT
ELECT PENCIL ROCKER SW 15FT (MISCELLANEOUS) IMPLANT
ELECT REM PT RETURN 15FT ADLT (MISCELLANEOUS) ×3 IMPLANT
GAUZE SPONGE 2X2 8PLY STRL LF (GAUZE/BANDAGES/DRESSINGS) IMPLANT
GLOVE BIO SURGEON STRL SZ7.5 (GLOVE) ×3 IMPLANT
GLOVE INDICATOR 8.0 STRL GRN (GLOVE) ×3 IMPLANT
GOWN STRL REUS W/TWL XL LVL3 (GOWN DISPOSABLE) ×9 IMPLANT
KIT BASIN OR (CUSTOM PROCEDURE TRAY) ×3 IMPLANT
L-HOOK LAP DISP 36CM (ELECTROSURGICAL)
LHOOK LAP DISP 36CM (ELECTROSURGICAL) IMPLANT
MESH ULTRAPRO 3X6 7.6X15CM (Mesh General) ×2 IMPLANT
PENCIL HANDSWITCHING (ELECTRODE) ×2 IMPLANT
SCISSORS LAP 5X35 DISP (ENDOMECHANICALS) ×3 IMPLANT
SET IRRIG TUBING LAPAROSCOPIC (IRRIGATION / IRRIGATOR) IMPLANT
SLEEVE XCEL OPT CAN 5 100 (ENDOMECHANICALS) ×3 IMPLANT
SPONGE GAUZE 2X2 STER 10/PKG (GAUZE/BANDAGES/DRESSINGS)
STRIP CLOSURE SKIN 1/2X4 (GAUZE/BANDAGES/DRESSINGS) ×1 IMPLANT
SUT MNCRL AB 4-0 PS2 18 (SUTURE) ×3 IMPLANT
SUT NOVA NAB DX-16 0-1 5-0 T12 (SUTURE) IMPLANT
SUT NOVA NAB GS-21 0 18 T12 DT (SUTURE) IMPLANT
SUT PROLENE 2 0 CT2 30 (SUTURE) ×4 IMPLANT
SUT VIC AB 2-0 SH 27 (SUTURE) ×3
SUT VIC AB 2-0 SH 27X BRD (SUTURE) IMPLANT
SUT VIC AB 3-0 SH 18 (SUTURE) ×2 IMPLANT
TOWEL OR 17X26 10 PK STRL BLUE (TOWEL DISPOSABLE) ×3 IMPLANT
TOWEL OR NON WOVEN STRL DISP B (DISPOSABLE) ×3 IMPLANT
TRAY LAPAROSCOPIC (CUSTOM PROCEDURE TRAY) ×3 IMPLANT
TROCAR BLADELESS OPT 5 100 (ENDOMECHANICALS) ×3 IMPLANT
TROCAR XCEL BLUNT TIP 100MML (ENDOMECHANICALS) ×3 IMPLANT
TUBING INSUF HEATED (TUBING) ×2 IMPLANT

## 2017-12-24 NOTE — Transfer of Care (Signed)
Immediate Anesthesia Transfer of Care Note  Patient: USIEL ASTARITA  Procedure(s) Performed: Procedure(s): INSERTION OF MESH (Right) HERNIA REPAIR INGUINAL ADULT (Right)  Patient Location: PACU  Anesthesia Type:General  Level of Consciousness: Patient easily awoken, sedated, comfortable, cooperative, following commands, responds to stimulation.   Airway & Oxygen Therapy: Patient spontaneously breathing, ventilating well, oxygen via simple oxygen mask.  Post-op Assessment: Report given to PACU RN, vital signs reviewed and stable, moving all extremities.   Post vital signs: Reviewed and stable.  Complications: No apparent anesthesia complications  Last Vitals:  Vitals Value Taken Time  BP 142/77 12/24/2017  9:32 AM  Temp    Pulse 65 12/24/2017  9:36 AM  Resp 12 12/24/2017  9:36 AM  SpO2 100 % 12/24/2017  9:36 AM  Vitals shown include unvalidated device data.  Last Pain:  Vitals:   12/24/17 3383  TempSrc:   PainSc: 0-No pain      Patients Stated Pain Goal: 4 (29/19/16 6060)  Complications: No apparent anesthesia complications

## 2017-12-24 NOTE — Op Note (Signed)
EMONI YANG [id number] 23-Oct-1940 12/24/2017   Laparoscopic Converted to Open Right Indirect Inguinal Hernia Repair with Mesh Procedure Note  Indications: The patient presented with a history of a right, reducible hernia.    Pre-operative Diagnosis: right reducible inguinal hernia  Post-operative Diagnosis: right indirect inguinal hernia  Surgeon: Greer Pickerel MD FACS  Anesthesia: General endotracheal anesthesia  Procedure Details  The patient was seen again in the Holding Room. The risks, benefits, complications, treatment options, and expected outcomes were discussed with the patient. The possibilities of reaction to medication, pulmonary aspiration, perforation of viscus, bleeding, recurrent infection, the need for additional procedures, and development of a complication requiring transfusion or further operation were discussed with the patient and/or family. The likelihood of success in repairing the hernia and returning the patient to their previous functional status is good.  There was concurrence with the proposed plan, and informed consent was obtained. The site of surgery was properly noted/marked. The patient was taken to the Operating Room, identified as KINSLER SOEDER, and the procedure verified as right inguinal hernia repair. A Time Out was held and the above information confirmed. He received preop enhance recovery meds preoperatively.   The patient was placed in the supine position and underwent induction of anesthesia. The lower abdomen and groin was prepped with Chloraprep and draped in the standard fashion. Next, a 1-cm vertical infraumbilical incision was made with a #11 blade. The fascia  was grasped and lifted anteriorly. Next, the fascia was incised, and  the abdominal cavity was entered. Pursestring suture was placed around  the fascial edges using a 0 Vicryl. A 12-mm Hasson trocar was placed.  Pneumoperitoneum was smoothly established up to a patient pressure  of 15  mmHg. Laparoscope was advanced. There was no evidence of a  contralateral hernia. The patient had a defect lateral to the inferior epigastric vessel, consistent with an right indirect  hernia.  The patient has a prior penile implant unfortunately his reservoir took up the lower midline extending to the right groin area.  I placed an additional 5 mm trocar in the right lateral abdominal wall under direct visualization to see if there was a way that I could work around the reservoir.  I felt that doing a tap procedure would expose the reservoir and would be too high risk for injuring the reservoir therefore I decided to convert to an open procedure An oblique incision was made. Dissection was carried down through the subcutaneous tissue with cautery to the external oblique fascia.  We opened the external oblique fascia along the direction of its fibers to the external ring.  The spermatic cord was circumferentially dissected bluntly and retracted with a Penrose drain.  The floor of the inguinal canal was inspected and there was no no direct defect.  We skeletonized the spermatic cord and isolated a hernia sac. A cord lipoma was ligated and discarded.  We used a 3 x 6 inch piece of Ultrapro mesh, which was cut into a keyhole shape.  This was secured with 2-0 Prolene, beginning at the pubic tubercle, running this along the shelving edge inferiorly. Superiorly, the mesh was secured to the internal oblique fascia with interrupted 2-0 Prolene sutures.  The tails of the mesh were sutured together behind the spermatic cord.  The mesh was tucked underneath the external oblique fascia laterally. Some exparel was infiltrated locally.  I then reestablished pneumoperitoneum and infiltrated Exparel in the preperitoneal space along the right lateral abdominal wall and in the  right lower abdominal wall as a tap block.  Pneumoperitoneum was released and the trochars were removed.  The external oblique fascia was  reapproximated with 2-0 Vicryl.  3-0 Vicryl was used to close the subcutaneous tissues and 4-0 Monocryl was used to close the skin in subcuticular fashion.  Dermabond was used to seal the incision.  A clean dressing was applied.  The patient was then extubated and brought to the recovery room in stable condition.  All sponge, instrument, and needle counts were correct prior to closure and at the conclusion of the case.   Estimated Blood Loss: Minimal                 Complications: None; patient tolerated the procedure well.         Disposition: PACU - hemodynamically stable.         Condition: stable  Leighton Ruff. Redmond Pulling, MD, FACS General, Bariatric, & Minimally Invasive Surgery [facility], [place]

## 2017-12-24 NOTE — Interval H&P Note (Signed)
History and Physical Interval Note:  12/24/2017 7:29 AM  Ray Fowler  has presented today for surgery, with the diagnosis of right inguinal hernia  The various methods of treatment have been discussed with the patient and family. After consideration of risks, benefits and other options for treatment, the patient has consented to  Procedure(s): [place] (Right) INSERTION OF MESH (Right) as a surgical intervention .  The patient's history has been reviewed, patient examined, no change in status, stable for surgery.  I have reviewed the patient's chart and labs.  Questions were answered to the patient's satisfaction.     Greer Pickerel

## 2017-12-24 NOTE — Anesthesia Procedure Notes (Signed)
Procedure Name: Intubation Date/Time: 12/24/2017 7:47 AM Performed by: Deliah Boston, CRNA Pre-anesthesia Checklist: Patient identified, Emergency Drugs available, Suction available and Patient being monitored Patient Re-evaluated:Patient Re-evaluated prior to induction Oxygen Delivery Method: Circle system utilized Preoxygenation: Pre-oxygenation with 100% oxygen Induction Type: IV induction Ventilation: Mask ventilation without difficulty Laryngoscope Size: Glidescope and 4 Grade View: Grade I Tube type: Oral Tube size: 7.5 mm Number of attempts: 1 Airway Equipment and Method: Oral airway,  Video-laryngoscopy and Rigid stylet Placement Confirmation: ETT inserted through vocal cords under direct vision,  positive ETCO2 and breath sounds checked- equal and bilateral Secured at: 22 cm Tube secured with: Tape Dental Injury: Teeth and Oropharynx as per pre-operative assessment  Difficulty Due To: Difficulty was anticipated and Difficult Airway- due to anterior larynx

## 2017-12-24 NOTE — Anesthesia Postprocedure Evaluation (Signed)
Anesthesia Post Note  Patient: Ray Fowler  Procedure(s) Performed: INSERTION OF MESH (Right ) HERNIA REPAIR INGUINAL ADULT (Right )     Patient location during evaluation: PACU Anesthesia Type: General Level of consciousness: sedated and patient cooperative Pain management: pain level controlled Vital Signs Assessment: post-procedure vital signs reviewed and stable Respiratory status: spontaneous breathing Cardiovascular status: stable Anesthetic complications: no    Last Vitals:  Vitals:   12/24/17 1117 12/24/17 1200  BP: 137/88 137/82  Pulse: 67 68  Resp: 14 14  Temp:  36.6 C  SpO2: 96% 96%    Last Pain:  Vitals:   12/24/17 1043  TempSrc:   PainSc: [place]

## 2017-12-24 NOTE — H&P (Signed)
Ray Fowler Documented: 12/13/2017 11:25 AM Location: [facility] Patient #: [id number] DOB: 06/05/1941 Married / Language: Ray Fowler / Race: White Male   History of Present Illness Randall Hiss M. Darbi Chandran MD; 12/13/2017 12:12 PM) The patient is a 77 year old male who presents with an inguinal hernia. Dr. Damita Dunnings referred this patient to our clinic to discuss a right inguinal hernia. He states that he noticed it after undergoing a colonoscopy. He has a bulge. It comes and goes. It does cause some discomfort which he describes as a dull toothache. The bulge is more noticeable in the afternoon and evening but it goes and when laying down. He is had a prior right inguinal incision for endovascular repair of a thoracic dissection at [facility]. 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 He endorses nocturia as well as weak stream. He denies any chest pain, chest pressure, source of breath, dyspnea on exertion. He only takes an aspirin. He does not smoke   Problem List/Past Medical Randall Hiss M. Redmond Pulling, MD; 12/13/2017 12:16 PM) RIGHT INGUINAL HERNIA (K40.90)  NOCTURIA (R35.1)   Past Surgical History (Ray Fowler, [place]; 12/13/2017 11:25 AM) Aneurysm Repair  Carotid Artery Surgery  Bilateral. Colon Polyp Removal - Colonoscopy  Oral Surgery  Prostate Surgery - Removal   Diagnostic Studies History (Ray Fowler, [name]; 12/13/2017 11:25 AM) Colonoscopy  within last year  Allergies (Ray Fowler, [place]; 12/13/2017 11:26 AM) No Known Drug Allergies [12/13/2017]: Allergies Reconciled   Medication History Randall Hiss M. Redmond Pulling, MD; 12/13/2017 12:16 PM) AmLODIPine Besylate (2.5MG  Tablet, Oral) Active. Atorvastatin Calcium (20MG  Tablet, Oral) Active. Bystolic (10MG  Tablet, Oral) Active. Flovent HFA (110MCG/ACT Aerosol, Inhalation) Active. HydroCHLOROthiazide (25MG  Tablet, Oral) Active. ProAir HFA (108 (90 Base)MCG/ACT Aerosol Soln,  Inhalation) Active. Mucinex Allergy (180MG  Tablet, Oral) Active. Medications Reconciled Flomax (0.4MG  Capsule, 1 (one) Oral daily, Taken starting 12/13/2017) Active.  Social History (Ray Fowler, [place]; 12/13/2017 11:25 AM) Alcohol use  Occasional alcohol use. Caffeine use  Carbonated beverages, Coffee, Tea. No drug use  Tobacco use  Former smoker.  Family History (Ray Fowler, [place]; 12/13/2017 11:25 AM) Arthritis  Father. Cerebrovascular Accident  Father. Heart Disease  Mother. Hypertension  Mother, Sister. Kidney Disease  Father. Thyroid problems  Daughter.  Other Problems Randall Hiss M. Redmond Pulling, MD; 12/13/2017 12:16 PM) Back Pain  Diverticulosis  Hemorrhoids  High blood pressure  Home Oxygen Use  Hypercholesterolemia  Inguinal Hernia  Kidney Stone  Melanoma  Prostate Cancer     Review of Systems Randall Hiss M. Carigan Lister MD; 12/13/2017 12:12 PM) General Not Present- Appetite Loss, Chills, Fatigue, Fever, Night Sweats, Weight Gain and Weight Loss. Note: All other systems negative (unless as noted in HPI & included Review of Systems) Skin Not Present- Change in Wart/Mole, Dryness, Hives, Jaundice, New Lesions, Non-Healing Wounds, Rash and Ulcer. HEENT Not Present- Earache, Hearing Loss, Hoarseness, Nose Bleed, Oral Ulcers, Ringing in the Ears, Seasonal Allergies, Sinus Pain, Sore Throat, Visual Disturbances, Wears glasses/contact lenses and Yellow Eyes. Respiratory Not Present- Bloody sputum, Chronic Cough, Difficulty Breathing, Snoring and Wheezing. Breast Not Present- Breast Mass, Breast Pain, Nipple Discharge and Skin Changes. Cardiovascular Not Present- Chest Pain, Difficulty Breathing Lying Down, Leg Cramps, Palpitations, Rapid Heart Rate, Shortness of Breath and Swelling of Extremities. Gastrointestinal Not Present- Abdominal Pain, Bloating, Bloody Stool, Change in Bowel Habits, Chronic diarrhea, Constipation, Difficulty Swallowing, Excessive gas, Gets full  quickly at meals, Hemorrhoids, Indigestion, Nausea, Rectal Pain and Vomiting. Male Genitourinary  Not Present- Blood in Urine, Change in Urinary Stream, Frequency, Impotence, Nocturia, Painful Urination, Urgency and Urine Leakage. Musculoskeletal Not Present- Back Pain, Joint Pain, Joint Stiffness, Muscle Pain, Muscle Weakness and Swelling of Extremities. Neurological Not Present- Decreased Memory, Fainting, Headaches, Numbness, Seizures, Tingling, Tremor, Trouble walking and Weakness. Psychiatric Not Present- Anxiety, Bipolar, Change in Sleep Pattern, Depression, Fearful and Frequent crying. Endocrine Not Present- Cold Intolerance, Excessive Hunger, Hair Changes, Heat Intolerance and New Diabetes. Hematology Not Present- Easy Bruising, Excessive bleeding, Gland problems, HIV and Persistent Infections.  Vitals (Ray A. Brown RMA; 12/13/2017 11:26 AM) 12/13/2017 11:25 AM Weight: 171 lb Height: 70in Body Surface Area: 1.95 m Body Mass Index: 24.54 kg/m  Temp.: 98.62F  Pulse: 87 (Regular)  BP: 128/72 (Sitting, Left Arm, Standard)       Physical Exam Randall Hiss M. Malvern Kadlec MD; 12/13/2017 12:14 PM) General Mental Status-Alert. General Appearance-Consistent with stated age. Hydration-Well hydrated. Voice-Normal.  Head and Neck Head-normocephalic, atraumatic with no lesions or palpable masses. Trachea-midline. Thyroid Gland Characteristics - normal size and consistency.  Eye Eyeball - Bilateral-Extraocular movements intact. Sclera/Conjunctiva - Bilateral-No scleral icterus.  ENMT Ears -Note: normal ext ears.  Mouth and Throat -Note: lips intact.   Chest and Lung Exam Chest and lung exam reveals -quiet, even and easy respiratory effort with no use of accessory muscles and on auscultation, normal breath sounds, no adventitious sounds and normal vocal resonance. Inspection Chest Wall - Normal. Back - normal.  Breast - Did not  examine.  Cardiovascular Cardiovascular examination reveals -normal heart sounds, regular rate and rhythm with no murmurs and normal pedal pulses bilaterally.  Abdomen Inspection Inspection of the abdomen reveals - No Hernias. Skin - Scar - no surgical scars. Palpation/Percussion Palpation and Percussion of the abdomen reveal - Soft, Non Tender, No Rebound tenderness, No Rigidity (guarding) and No hepatosplenomegaly. Auscultation Auscultation of the abdomen reveals - Bowel sounds normal.  Male Genitourinary Note: old transverse lower Rt inguinal incision. obvious bulge in Rt groin, easily reducible; no bulge in L groin with valsalva. reservoir in rt groin.   Peripheral Vascular Upper Extremity Palpation - Pulses bilaterally normal.  Neurologic Neurologic evaluation reveals -alert and oriented x 3 with no impairment of recent or remote memory. Mental Status-Normal.  Neuropsychiatric The patient's mood and affect are described as -normal. Judgment and Insight-insight is appropriate concerning matters relevant to self.  Musculoskeletal Normal Exam - Left-Upper Extremity Strength Normal and Lower Extremity Strength Normal. Normal Exam - Right-Upper Extremity Strength Normal and Lower Extremity Strength Normal.  Lymphatic Head & Neck  General Head & Neck Lymphatics: Bilateral - Description - Normal. Axillary - Did not examine. Femoral & Inguinal - Did not examine.    Assessment & Plan Randall Hiss M. Danen Lapaglia MD; 12/13/2017 12:16 PM) RIGHT INGUINAL HERNIA (K40.90) Impression: We discussed the etiology of inguinal hernias. We discussed the signs & symptoms of incarceration & strangulation. We discussed non-operative and operative management. He has had both open as well as some pelvic surgery in the past. It appears his prostatectomy was through his rectum per the patient. There may be less scar tissue going laparoscopic since he has an old right groin incision from his  aneurysm repair.  The patient has elected to proceed. We discussed laparoscopic repair of right inguinal hernia with mesh.   I described the procedure in detail. The patient was given Neurosurgeon. We discussed the risks and benefits including but not limited to bleeding, infection, chronic inguinal pain, nerve entrapment, hernia recurrence, mesh complications, hematoma  formation, urinary retention, injury to the testicles or the ovaries, numbness in the groin, blood clots, injury to the surrounding structures, and anesthesia risk. We also discussed the typical post operative recovery course, including no heavy lifting for 4-6 weeks. I explained that the likelihood of improvement of their symptoms is good. we also discussed possibility of conversion to open based on prior surgery. Current Plans Pt Education - Pamphlet Given - Laparoscopic Hernia Repair: discussed with patient and provided information. please pick up Flomax prescription and start taking 2 weeks before hernia surgery. this will help decrease possibility of urinary retention after surgery  I recommended obtaining preoperative cardiac clearance. I am concerned about the health of the patient and the ability to tolerate the operation. Therefore, we will request clearance by cardiology to better assess operative risk & see if a reevaluation, further workup, etc is needed. Also recommendations on how medications such as for anticoagulation and blood pressure should be managed/held/restarted after surgery.  NOCTURIA (R35.1) Impression: We will place him on perioperative Flomax to try to decrease his risk of urinary retention Current Plans Started Flomax 0.4 MG Oral Capsule, 1 (one) Capsule daily, #30, 12/13/2017, No Refill.   Leighton Ruff. Redmond Pulling, MD, FACS General, Bariatric, & Minimally Invasive Surgery [facility], [place]

## 2017-12-24 NOTE — Discharge Instructions (Signed)
[facility]  POST OP INSTRUCTIONS Always review your discharge instruction sheet given to you by the facility where your surgery was performed. IF YOU HAVE DISABILITY OR FAMILY LEAVE FORMS, YOU MUST BRING THEM TO THE OFFICE FOR PROCESSING.   DO NOT GIVE THEM TO YOUR DOCTOR.  PAIN CONTROL  1. First take acetaminophen (Tylenol) AND/or ibuprofen (Advil) to control your pain after surgery.  Follow directions on package.  Taking acetaminophen (Tylenol) and/or ibuprofen (Advil) regularly after surgery will help to control your pain and lower the amount of prescription pain medication you may need.  You should not take more than 4,000 mg (4 grams) of acetaminophen (Tylenol) in 24 hours.  You should not take ibuprofen (Advil), aleve, motrin, naprosyn or other NSAIDS if you have a history of stomach ulcers or chronic kidney disease.  2. A prescription for pain medication may be given to you upon discharge.  Take your pain medication as prescribed, if you still have uncontrolled pain after taking acetaminophen (Tylenol) or ibuprofen (Advil). 3. Use ice packs to help control pain. 4. If you need a refill on your pain medication, please contact your pharmacy.  They will contact our office to request authorization. Prescriptions will not be filled after 5pm or on week-ends.  HOME MEDICATIONS 5. Take your usually prescribed medications unless otherwise directed.  DIET 6. You should follow a light diet the first few days after arrival home.  Be sure to include lots of fluids daily. Avoid fatty, fried foods.   CONSTIPATION 7. It is common to experience some constipation after surgery and if you are taking pain medication.  Increasing fluid intake and taking a stool softener (such as Colace) will usually help or prevent this problem from occurring.  A mild laxative (Milk of Magnesia or Miralax) should be taken according to package instructions if there are no bowel movements after 48  hours.  WOUND/INCISION CARE 8. Most patients will experience some swelling and bruising in the area of the incisions.  Ice packs will help.  Swelling and bruising can take several days to resolve.  9. Unless discharge instructions indicate otherwise, follow guidelines below  a. STERI-STRIPS - you may remove your outer bandages 48 hours after surgery, and you may shower at that time.  You have steri-strips (small skin tapes) in place directly over the incision.  These strips should be left on the skin for 7-10 days.   b. DERMABOND/SKIN GLUE - you may shower in 24 hours.  The glue will flake off over the next 2-3 weeks. 10. Any sutures or staples will be removed at the office during your follow-up visit.  ACTIVITIES 11. You may resume regular (light) daily activities beginning the next day--such as daily self-care, walking, climbing stairs--gradually increasing activities as tolerated.  You may have sexual intercourse when it is comfortable.  Refrain from any heavy lifting or straining until approved by your doctor. a. You may drive when you are no longer taking prescription pain medication, you can comfortably wear a seatbelt, and you can safely maneuver your car and apply brakes.  FOLLOW-UP 12. You should see your doctor in the office for a follow-up appointment approximately 2-3 weeks after your surgery.  You should have been given your post-op/follow-up appointment when your surgery was scheduled.  If you did not receive a post-op/follow-up appointment, make sure that you call for this appointment within a day or two after you arrive home to insure a convenient appointment time.  OTHER INSTRUCTIONS  13. Do not lift, push, or pull anything greater than 15 lbs for 4 weeks  WHEN TO CALL YOUR DOCTOR: 1. Fever over 101.0 2. Inability to urinate 3. Continued bleeding from incision. 4. Increased pain, redness, or drainage from the incision. 5. Increasing abdominal pain  The clinic staff is  available to answer your questions during regular business hours.  Please dont hesitate to call and ask to speak to one of the nurses for clinical concerns.  If you have a medical emergency, go to the nearest emergency room or call 911.  A surgeon from [facility] is always on call at the hospital. [address], [place], [place], [place]  [address] ? [address], [place], [place]   [address] [phone] ? [phone] ? FAX [phone] Web site: [email or web address]

## 2018-04-02 NOTE — Progress Notes (Signed)
Cardiology Office Note  Date:  04/03/2018   ID:  Ray Fowler, Ray Fowler 1940-09-01, MRN [id number]  PCP:  Ray Ghent, MD   Chief Complaint  Patient presents with  . other    12 mo f/u Medications verbally reviewed.     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  Post-op course complicated by persistent HTN and atrial flutter treated with amio (stopped in January 2011).  He has followup with cardiothoracic surgery at [facility] on an annual basis with CT scan surveillance. CT September 2014 showing remodeling of his thoracic aorta, overall looked good/stable. He presents for routine followup of his of his hypertension And aortic dissection  Stress from parking today with construction Blood pressure elevated on arrival Initially 810F systolic Improved to high 140s at the time of leaving the office He reports blood pressure typically 130s up to 140 at home  Compliant with his bystolic amlodipine and HCTZ  Questions concerning whether he needs to be on aspirin Also whether he needs to take prophylactic antibiotics before dental procedures  Tolerating statin Total cholesterol 120s Lab work reviewed with him  Scheduled For follow-upCT scan March 2020 Some discomfort left neck which she attributes to postsurgical changes  EKG personally reviewed by myself on todays visit Shows normal sinus rhythm with rate 62 bpm rare PVC no significant Ray or T-wave changes  CT 10/2016 1.Stable postsurgical findings following endovascular repair of the thoracic aorta and debranching procedure. 2.Redemonstrated dissection of the abdominal aorta extending into the external iliac arteries bilaterally. The  dissection flap extends into the SMA and left main renal artery. 3.A 2.1 cm low-attenuation focus at the posterior aspect of the right renal cortex does not measure simple fluid attenuation. This is unchanged in size/appearance relative to comparison exam (May 13, 2015). This does not demonstrate arterial phase enhancement on the current examination.  In hospital 08/21/2016 to 1/15 Had flu and PNA 08/2016 CT 08/26/2016 at [facility]  Other past medical history Prior ECHO showed EF 45-50%. Had cardiac CT in 12/10 after abnormal myvoiew. Coronaries normal except for 25% RCA. 75mm stable pulmonary nodule.  echo (02/2010) EF 55-60% with grade 1 diastolic dysfx and previous AO dissection flap.  pneumonia in November 2012 with chest x-ray showing dilated aorta. 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 he was referred to [facility] for surgery. He had an arch branching procedure involving neo-ascending aorta to the right common carotid and right subclavian arteries individually, and branch to the left common carotid artery, concomittant left carotid to the subclavian artery bypass in the neck with endovascular exclusion of the aneurysm with endograft to extend from the hemi-arch back autograft down to the distal descending thoracic aorta.  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  PSH:    Past Surgical History:  Procedure Laterality  Date  . ABDOMINAL AORTIC ANEURYSM REPAIR  march 2013  . Axillary Mass  removal  05/2000   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Ray Fowler)  . CARDIAC SURGERY  2013   at Ray Fowler- transverse aortic arch/descending aneurysm repair s/p debranching procedure  (total of 5 Gore C-TAG devices extending from distal descending aorta to his prev placed ascending dacron graft)  . COLONOSCOPY    . Flex- Polyp   06/1999   at 38cm, Int/Ext Hemm divertics Ray Fowler)  . Hemi Arch Replacement  03/2009 and repeat 10/2011   and AoV resuspension w/ Repl of Ascending Aorta and Hemiarch w/ Graft; Pericardial Tamponade Aortic Regurg 8/22-8/28/10;  endovascular repair of aortic dilation 2013 with  ascending aorta to L common carotid, R subclavian, R common carotid and L carotid subclavian bypass  . [facility]  08/22-8/28/2010   Thoracic aortic dissection pericardial teamponade aortic regurg  . INGUINAL HERNIA REPAIR Right 12/24/2017   Procedure: HERNIA REPAIR INGUINAL ADULT;  Surgeon: Ray Pickerel, MD;  Location: [facility];  Service: General;  Laterality: Right;  . INSERTION OF MESH Right 12/24/2017   Procedure: INSERTION OF MESH;  Surgeon: Ray Pickerel, MD;  Location: [facility];  Service: General;  Laterality: Right;  . melanoma removed from scalp  06/2006   [facility]  . PENILE PROSTHESIS IMPLANT  1996  . POLYPECTOMY    . PROSTATECTOMY  12/1993   Dr. Risa Fowler  . WISDOM TOOTH EXTRACTION  10/2005    Current Outpatient Medications  Medication Sig Dispense Refill  . albuterol (PROVENTIL HFA;VENTOLIN HFA) 108 (90 Base) MCG/ACT inhaler Inhale 2 puffs into the lungs every 6 (six) hours as needed for wheezing or shortness of breath. 1 Inhaler 0  . amLODipine (NORVASC) 2.5 MG tablet TAKE 1 TABLET (2.5 MG TOTAL) BY MOUTH 2 (TWO) TIMES DAILY. 180 tablet 3  . atorvastatin (LIPITOR) 20 MG tablet TAKE 1 TABLET (20 MG TOTAL) BY MOUTH AT BEDTIME. 90 tablet 3  . FLOVENT HFA 110 MCG/ACT inhaler INHALE ONE PUFF BY MOUTH TWICE DAILY. RINSE MOUTH AFTER USE.. (Patient taking differently: Inhale 1 puff by mouth twice  daily as needed for shortness of breath or wheezing) 12 Inhaler 2  . fluticasone (FLONASE) 50 MCG/ACT nasal spray SPRAY TWO SPRAYS IN EACH NOSTRIL DAILY (Patient taking differently: Use 1 spray in each nostril daily as needed for congestion) 16 g 4  . hydrochlorothiazide (HYDRODIURIL) 25 MG tablet Take 1 tablet (25 mg total) by mouth every other day.    . Lutein-Zeaxanthin 25-5 MG CAPS Take 1 capsule by mouth daily.    . meclizine (ANTIVERT) 25 MG tablet Take 1 tablet (25 mg total) by mouth 3 (three) times daily as needed. (Patient taking differently: Take 25 mg by mouth 3 (three) times daily as needed for dizziness. ) 90 tablet 3  . Multiple Vitamin (MULITIVITAMIN WITH MINERALS) TABS Take 1 tablet by mouth daily.    . Nebivolol HCl 20 MG TABS Take 1 tablet (20 mg total) by mouth daily. 180 tablet 0  . promethazine (PHENERGAN) 25 MG tablet Take 1 tablet (25 mg total) by mouth every 6 (six) hours as needed for nausea or vomiting. 30 tablet 3  . guaiFENesin (MUCINEX) 600 MG 12 hr tablet Take 600 mg by mouth 2 (two) times daily as needed for cough or to loosen phlegm.      No current facility-administered medications for this visit.      Allergies:   Lactose intolerance (gi)   Social History:  The patient  reports that he quit smoking about 23 years ago. His smoking use included cigarettes. He has a 25.00 pack-year smoking history. He has never used smokeless tobacco. He reports that he drinks alcohol. He reports that he does not use drugs.   Family History:   family history includes Colon polyps in his sister; Diabetes in his other and other; Heart disease in his father and mother; Heart failure in his mother; Hyperlipidemia in his daughter; Hypertension in his mother; Kidney disease in his father; Kidney failure in his father; Prostate cancer in his father; Stroke in his father.    Review of Systems: Review of Systems  Constitutional: Negative.   Cardiovascular: Negative.   Gastrointestinal:  Negative.   Musculoskeletal: Negative.   Neurological: Negative.   Psychiatric/Behavioral: Negative.   All other systems reviewed and are negative.   PHYSICAL EXAM: VS:  BP (!) 168/82 (BP Location: Left Arm, Patient Position: Sitting, Cuff Size: Normal)   Pulse 72   Ht 6' (1.829 m)   Wt 167 lb 8 oz (76 kg)   BMI 22.72 kg/m  , BMI Body mass index is 22.72 kg/m. Constitutional:  oriented to person, place, and time. No distress.  HENT:  Head: Normocephalic and atraumatic.  Eyes:  no discharge. No scleral icterus.  Neck: Normal range of motion. Neck supple. No JVD present.  Cardiovascular: Normal rate, regular rhythm, normal heart sounds and intact distal pulses. Exam reveals no gallop and no friction rub. No edema No murmur heard. Pulmonary/Chest: Effort normal and breath sounds normal. No stridor. No respiratory distress.  no wheezes.  no rales.  no tenderness.  Abdominal: Soft.  no distension.  no tenderness.  Musculoskeletal: Normal range of motion.  no  tenderness or deformity.  Neurological:  normal muscle tone. Coordination normal. No atrophy Skin: Skin is warm and dry. No rash noted. not diaphoretic.  Psychiatric:  normal mood and affect. behavior is normal. Thought content normal.   Recent Labs: 08/09/2017: BNP 248.7 08/21/2017: ALT 21; TSH 2.30 12/20/2017: BUN 22; Creatinine, Ser 1.01; Hemoglobin 15.4; Platelets 146; Potassium 3.7; Sodium 139    Lipid Panel Lab Results  Component Value Date   CHOL 129 08/21/2017   HDL 36.10 (L) 08/21/2017   LDLCALC 71 08/21/2017   TRIG 110.0 08/21/2017      Wt Readings from Last 3 Encounters:  04/03/18 167 lb 8 oz (76 kg)  12/24/17 167 lb (75.8 kg)  12/20/17 167 lb (75.8 kg)       ASSESSMENT AND Fowler:  Essential hypertension - Fowler: EKG 12-Lead Blood pressure borderline elevated Recommended he take extra amlodipine as needed for high pressures Could also increase dose of bystolic He prefers to regulate this himself at  home New prescription sent in  Coronary artery disease involving native coronary artery of native heart without angina pectoris Currently with no symptoms of angina. No further workup at this time. Continue current medication regimen. Cholesterol is at goal on the current lipid regimen.  Mild coronary calcifications seen on previous CT scan  Frequent PVCs Asymptomatic Continue beta blocker  Dissection of thoracic aorta ([place]), H/O ascending aorta repair Followed at [facility], scan every 24 months He has follow-up March 2020    Total encounter time more than 25 minutes  Greater than 50% was spent in counseling and coordination of care with the patient   Disposition:   F/U  12 months   No orders of the defined types were placed in this encounter.    Signed, Tim  Rockey Situ, M.D., Ph.D. 04/03/2018  [place], [place]

## 2018-04-03 ENCOUNTER — Encounter: Payer: Self-pay | Admitting: Cardiovascular Disease

## 2018-04-03 ENCOUNTER — Telehealth: Payer: Self-pay | Admitting: Cardiovascular Disease

## 2018-04-03 ENCOUNTER — Ambulatory Visit: Payer: Medicare Other | Admitting: Cardiovascular Disease

## 2018-04-03 VITALS — BP 149/80 | HR 72 | Ht 72.0 in | Wt 167.5 lb

## 2018-04-03 DIAGNOSIS — I429 Cardiomyopathy, unspecified: Secondary | ICD-10-CM | POA: Diagnosis not present

## 2018-04-03 DIAGNOSIS — I493 Ventricular premature depolarization: Secondary | ICD-10-CM

## 2018-04-03 DIAGNOSIS — I10 Essential (primary) hypertension: Secondary | ICD-10-CM

## 2018-04-03 DIAGNOSIS — I25118 Atherosclerotic heart disease of native coronary artery with other forms of angina pectoris: Secondary | ICD-10-CM | POA: Diagnosis not present

## 2018-04-03 DIAGNOSIS — I4892 Unspecified atrial flutter: Secondary | ICD-10-CM | POA: Diagnosis not present

## 2018-04-03 DIAGNOSIS — I359 Nonrheumatic aortic valve disorder, unspecified: Secondary | ICD-10-CM

## 2018-04-03 DIAGNOSIS — Z9889 Other specified postprocedural states: Secondary | ICD-10-CM

## 2018-04-03 DIAGNOSIS — E785 Hyperlipidemia, unspecified: Secondary | ICD-10-CM | POA: Diagnosis not present

## 2018-04-03 MED ORDER — NEBIVOLOL HCL 20 MG PO TABS: 40.0000 mg | ORAL_TABLET | Freq: Two times a day (BID) | ORAL | 4 refills | Status: DC

## 2018-04-03 MED ORDER — PROMETHAZINE HCL 25 MG PO TABS: 25.0000 mg | ORAL_TABLET | Freq: Four times a day (QID) | ORAL | 3 refills | Status: DC | PRN

## 2018-04-03 MED ORDER — AMLODIPINE BESYLATE 5 MG PO TABS: 5.0000 mg | ORAL_TABLET | Freq: Two times a day (BID) | ORAL | 3 refills | Status: DC

## 2018-04-03 NOTE — Patient Instructions (Addendum)
Medication Instructions:   No medication changes made Medication Samples have been provided to the patient.  Drug name: Bystolic       Strength: 20 mg        Qty: 4 boxes  LOT: [id number]  Exp.Date: 5/21    Labwork:  No new labs needed  Testing/Procedures:  No further testing at this time   Follow-Up: It was a pleasure seeing you in the office today. Please call us if you have new issues that need to be addressed before your next appt.  [phone]  Your physician wants you to follow-up in: 12 months.  You will receive a reminder letter in the mail two months in advance. If you don't receive a letter, please call our office to schedule the follow-up appointment.  If you need a refill on your cardiac medications before your next appointment, please call your pharmacy.  For educational health videos Log in to : [email or web address] Or : [email or web address], password : [email or web address]

## 2018-04-03 NOTE — Telephone Encounter (Signed)
Please review pt pharmacy requesting clarification.

## 2018-04-03 NOTE — Telephone Encounter (Signed)
Pharmacy called asking for a call back to clarify what prescription we sent in  It was on Bystolic.  Please call back

## 2018-04-03 NOTE — Telephone Encounter (Signed)
Spoke with pharmacy and clarified medication sent in by provider. Advised that comments section was just not taken out but that Dr. Rockey Situ sent in as ordered. They will get prescription ready for patient and had no further questions at this time.

## 2018-04-07 ENCOUNTER — Other Ambulatory Visit: Payer: Self-pay | Admitting: Cardiovascular Disease

## 2018-04-11 ENCOUNTER — Telehealth: Payer: Self-pay | Admitting: *Deleted

## 2018-04-11 NOTE — Telephone Encounter (Signed)
I spoke with pt today and he mentioned that he is taking Bystolic 20 mg qd. Dr. Rockey Situ sent in the Rx so pt could get more pills for his money. Pt mentioned that he was told that Rx would be written "double his Prescription" in order to get more pills.  We have received a PA due to last Rx sent concerning Rx request. Pt would like to know if he should continue HCTZ because it was not discussed at his last OV. Pt would like 90 day Rx for HCTZ and 30 day Rx for Bystolic. Pt takes HCTZ every other day. Please advise.

## 2018-04-11 NOTE — Telephone Encounter (Signed)
We have received PA for Bystolic 20 mg tablet. Pt last Rx was sent as listed below:  Nebivolol HCl 20 MG TABS 180 tablet 4 04/03/2018    Sig - Route: Take 2 tablets (40 mg total) by mouth 2 (two) times daily. - Oral   Sent to pharmacy as: Nebivolol HCl 20 MG Tab   Notes to Pharmacy: PATIENT WOULD LIKE 20MG  TABLETS 1 TABLET DAILY INSTEAD OF10MG  TABLET 2 TABLETS DAILY. THANKS.   E-Prescribing Status: Receipt confirmed by pharmacy (04/03/2018 12:02 PM EDT)    I called pt to verify instructions lmovm. Please advise correct instructions for Bystolic. Pt may not need PA if sent in correctly.

## 2018-04-13 NOTE — Telephone Encounter (Signed)
Previous prescription for bystolic was 20 mg daily Blood pressure continues to run high which is dangerous, Last Clinic Visit Would recommend bystolic 20 twice a day , 60 pills a month or 180 for 3 months He can continue HCTZ every other day

## 2018-04-15 NOTE — Addendum Note (Signed)
Addended by: Vanessa Ralphs on: 04/15/2018 11:43 AM   Modules accepted: Orders

## 2018-04-15 NOTE — Telephone Encounter (Signed)
S/w patient. Patient is concerned with the dosage of medications he is taking. He was under the impression that his dosage of Bystolic, amlodipine and HCTZ would stay the same but the quantity in the bottle would increase. The way he was told to take the medications are not written on his bottle that way.  He thought he was still supposed to take:  1-Bystolic 20 mg once a day. 2- Amlodipine 5 mg once a day (previous taking 2 tablets of 2.5 mg (5mg ) once a day) 3- HCTZ 25 mg every other day.  However, prescriptions were sent in for: 1- Bystolic 2 tablets (40 mg) two times a day. 2- Amlodipine 5 mg two times a day. 3- HCTZ - Patient received message from pharmacy that Dr Rockey Situ wanted him to stop it. However, I explained to the patient that Dr Rockey Situ wants him to continue every other day.   The way the prescriptions were sent in do not reflect on patient's AVS instructions though the changes are on the med list in the back.  Patient is very concerned that he does not understanding what amount of each medication he should be taking.  When I explained how to take bystolic and HCTZ, according to this previous entry from Dr Rockey Situ, Patient was not convinced and wanted to make sure once more with Dr Rockey Situ.  Routing to Dr Rockey Situ for clarification of all three medications.

## 2018-04-17 ENCOUNTER — Telehealth: Payer: Self-pay

## 2018-04-17 ENCOUNTER — Other Ambulatory Visit: Payer: Self-pay | Admitting: Cardiovascular Disease

## 2018-04-17 NOTE — Telephone Encounter (Signed)
Key: [id number] - PA Case ID: [id number] - Rx #: [id number]  When submitting the Prior Auth I received this message:  "This medication or product is on your plan's list of covered drugs. Prior authorization is not required at this time. If your pharmacy has questions regarding the processing of your prescription, please have them call the [facility] pharmacy help desk at [phone]. **Please note: Formulary lowering, tiering exception, cost reduction and/or pre-benefit determination review requests cannot be requested using this method of submission. Please contact us at [phone] instead."

## 2018-04-17 NOTE — Telephone Encounter (Signed)
2nd request regarding a refill on HCTZ 25 mg. The last refill was from Dr. Damita Dunnings on 08/2017 but [place] stated the patient was given a refill 11/2017 by Dr. Rockey Situ for HCTZ 25 mg one tablet daily. The Rx reads in chart HCTZ 25 mg take one tablet every other day. Please advise and contact pharmacy with update. The patient is fairly new to [place] per the pharmacist.

## 2018-04-17 NOTE — Telephone Encounter (Signed)
°*  STAT* If patient is at the pharmacy, call can be transferred to refill team.   1. Which medications need to be refilled? (please list name of each medication and dose if known) HCTZ 25 mg po every other day   2. Which pharmacy/location (including street and city if local pharmacy) is medication to be sent to? [facility] [place]   3. Do they need a 30 day or 90 day supply? 90  PATIENT AUDIBLY IRRITATED AND SAYS HE HAS BEEN TRYING TO GET REFILLED FOR 2 WEEKS .  NEEDS TO CLARIFY IF THIS WAS DISCONTINUED OR IF HE SHOULD CONTINUE TO TAKE.

## 2018-04-17 NOTE — Telephone Encounter (Addendum)
FYI: The patient is requesting the HCTZ 25 mg to be sent to [place].

## 2018-04-17 NOTE — Telephone Encounter (Signed)
Medication Refill (HCTZ 25 mg )   Spoke with Mr. Biedermann regarding the refill on HCTZ 25 mg tablet.   Told the patient Dr. Damita Dunnings decreased the the dose of HCTZ 25 mg from one tablet everyday to one tablet every other day on 08/27/2017. The patient is aware to contact Dr. Damita Dunnings for the refill on this.    FYI: The patient is requesting the HCTZ 25 mg to be sent to [place].   2nd request regarding a refill on HCTZ 25 mg. The last refill was from Dr. Damita Dunnings on 08/2017 but [place] stated the patient was given a refill 11/2017 by Dr. Rockey Situ for HCTZ 25 mg one tablet daily. The Rx reads in chart HCTZ 25 mg take one tablet every other day. Please advise and contact pharmacy with update. The patient is fairly new to [place] per the pharmacist.    Documentation   You  Rhoads, Rhea W 50 minutes ago (10:18 AM)  Meriam Sprague B routed conversation to [facility] Refills 1 hour ago (9:35 AM)  Meriam Sprague B 1 hour ago (9:31 AM)       *STAT* If patient is at the pharmacy, call can be transferred to refill team.   1. Which medications need to be refilled? (please list name of each medication and dose if known) HCTZ 25 mg po every other day   2. Which pharmacy/location (including street and city if local pharmacy) is medication to be sent to? [facility]   3. Do they need a 30 day or 90 day supply? 90  PATIENT AUDIBLY IRRITATED AND SAYS HE HAS BEEN TRYING TO GET REFILLED FOR 2 WEEKS .  NEEDS TO CLARIFY IF THIS WAS DISCONTINUED OR IF HE SHOULD CONTINUE TO TAKE.      Documentation   [place], Cart [phone]  Clarisse Gouge

## 2018-04-17 NOTE — Telephone Encounter (Signed)
Spoke with Ray Fowler regarding the refill on HCTZ 25 mg tablet.   Told the patient Dr. Damita Dunnings decreased the the dose of HCTZ 25 mg from one tablet everyday to one tablet every other day on 08/27/2017. The patient is aware to contact Dr. Damita Dunnings for the refill on this.

## 2018-04-18 ENCOUNTER — Other Ambulatory Visit: Payer: Self-pay | Admitting: *Deleted

## 2018-04-18 MED ORDER — HYDROCHLOROTHIAZIDE 25 MG PO TABS: 25.0000 mg | ORAL_TABLET | ORAL | 3 refills | Status: DC

## 2018-04-22 ENCOUNTER — Telehealth: Payer: Self-pay | Admitting: Cardiovascular Disease

## 2018-04-22 MED ORDER — NEBIVOLOL HCL 20 MG PO TABS: 20.0000 mg | ORAL_TABLET | Freq: Every day | ORAL | 11 refills | Status: DC

## 2018-04-22 NOTE — Telephone Encounter (Signed)
See additional telephone encounter on 04/22/18. Prescription for bystolic sent into patients pharmacy per his request.

## 2018-04-22 NOTE — Telephone Encounter (Signed)
Patient called in regarding medication and is very upset stating that if he has to switch doctors because of these medication issues he will not be happy. He reports receiving paperwork in the mail from [place] regarding his Bystolic. Reviewed with him that his insurance company will sometimes reach out to see if getting it through their mail order may sometimes be cheaper. He states that he just wants direct instructions and that playing around with his medications is just not working for him. He states that he wonders what else could be wrong. Questioned him about his medication and what he is needing. He states that he takes Bystolic 20 mg once daily and that he needs refills. Refill sent into his pharmacy he requested that I please send in refills with specific instructions. He also wants to know specifically what he should be taking with his amlodipine because he has a bottle with 2.5 mg pills and another with 5 mg pills. Reviewed that based on his chart it looks like he should be taking Amlodipine 5 mg twice a day. He verbalized understanding and requested that we please call him if any further changes are needed to his current regimen. Instructed him to please call back if he should have any further questions, concerns, or problems with his medications. Let him know that I would route to provider for review as well. He was audibly upset with these medication issues.

## 2018-04-22 NOTE — Telephone Encounter (Signed)
He can stay on what he was on before if he likes bystolic 20 daily Previously was taking amlodipine 2.5 twice daily HCTZ every other day  Pressures were high on his last clinic visit Would monitor pressure at home If blood pressure is high and he would like amlodipine 5 twice a day he can call us If blood pressure runs high and he would like bystolic 20 twice daily he can call us

## 2018-04-22 NOTE — Telephone Encounter (Signed)
Patient is asking for a call back, he would not give much detail  But states the nurse Pam knows what this is about and would like to talk about thsi  It is about medication he states  Please advise

## 2018-05-14 ENCOUNTER — Ambulatory Visit (INDEPENDENT_AMBULATORY_CARE_PROVIDER_SITE_OTHER): Payer: Medicare Other

## 2018-05-14 DIAGNOSIS — Z23 Encounter for immunization: Secondary | ICD-10-CM | POA: Diagnosis not present

## 2018-06-04 ENCOUNTER — Other Ambulatory Visit: Payer: Self-pay | Admitting: Cardiovascular Disease

## 2018-07-04 ENCOUNTER — Ambulatory Visit: Payer: Medicare Other | Admitting: Family Medicine

## 2018-07-04 ENCOUNTER — Encounter: Payer: Self-pay | Admitting: Family Medicine

## 2018-07-04 DIAGNOSIS — I10 Essential (primary) hypertension: Secondary | ICD-10-CM

## 2018-07-04 MED ORDER — AMLODIPINE BESYLATE 5 MG PO TABS: 2.5000 mg | ORAL_TABLET | Freq: Two times a day (BID) | ORAL | Status: DC

## 2018-07-04 MED ORDER — NEBIVOLOL HCL 20 MG PO TABS: 20.0000 mg | ORAL_TABLET | Freq: Every day | ORAL | Status: DC

## 2018-07-04 NOTE — Progress Notes (Signed)
Ankle swelling.  Started when he was put on 10mg  amlodipine.  He didn't have trouble on 5mg /day dosing.  Still on HCTZ QOD, taking it daily didn't help.  No change in diet.    No CP, SOB.  Not SOB supine.  Weight is stable.   Meds, vitals, and allergies reviewed.   ROS: Per HPI unless specifically indicated in ROS section   GEN: nad, alert and oriented HEENT: mucous membranes moist NECK: supple w/o LA CV: rrr PULM: ctab, no inc wob ABD: soft, +bs EXT: L>R mild BLE edema.  SKIN: no acute rash

## 2018-07-04 NOTE — Patient Instructions (Addendum)
Cut the amlodipine back to 5mg  a day and update me about your BP and swelling on Tuesday.  Take care.  Glad to see you.

## 2018-07-05 ENCOUNTER — Other Ambulatory Visit: Payer: Self-pay | Admitting: Family Medicine

## 2018-07-06 NOTE — Assessment & Plan Note (Signed)
Treated with amlodipine.  Higher dose of amlodipine is likely causing the swelling.  Discussed options. Cut the amlodipine back to 5mg  a day and update me about BP and swelling on Tuesday of next week.  He agrees. We may need to change hydrochlorothiazide to daily and continue him on 5 mg a day of amlodipine.  It is okay to take the amlodipine in a split dose with 2.5 mg twice a day.

## 2018-07-08 ENCOUNTER — Telehealth: Payer: Self-pay | Admitting: *Deleted

## 2018-07-08 NOTE — Telephone Encounter (Signed)
Patient advised.

## 2018-07-08 NOTE — Telephone Encounter (Signed)
I would continue as is for now.  If his BP goes any higher or if the swelling gets any worse then we can make some changes to his meds.  Update me as needed.  Thanks.

## 2018-07-08 NOTE — Telephone Encounter (Signed)
Spoke to pt who states he was to contact office back with update of status after changes made in meds. Pt states that the swelling in his feet and ankles have completely subsided in the am, but is present " about 20-30% in his L foot at night." He has been keeping check on his BP which has been running 140s/60s. Pls advise pt if further action required.

## 2018-08-13 DIAGNOSIS — H269 Unspecified cataract: Secondary | ICD-10-CM

## 2018-08-13 DIAGNOSIS — H3530 Unspecified macular degeneration: Secondary | ICD-10-CM

## 2018-08-13 HISTORY — DX: Unspecified macular degeneration: H35.30

## 2018-08-13 HISTORY — DX: Unspecified cataract: H26.9

## 2018-09-04 ENCOUNTER — Ambulatory Visit (INDEPENDENT_AMBULATORY_CARE_PROVIDER_SITE_OTHER): Payer: Medicare Other

## 2018-09-04 VITALS — BP 140/60 | HR 66 | Temp 97.5°F | Ht 71.5 in | Wt 169.2 lb

## 2018-09-04 DIAGNOSIS — Z125 Encounter for screening for malignant neoplasm of prostate: Secondary | ICD-10-CM | POA: Diagnosis not present

## 2018-09-04 DIAGNOSIS — Z0000 Encounter for general adult medical examination without abnormal findings: Secondary | ICD-10-CM | POA: Diagnosis not present

## 2018-09-04 DIAGNOSIS — E781 Pure hyperglyceridemia: Secondary | ICD-10-CM

## 2018-09-04 DIAGNOSIS — I10 Essential (primary) hypertension: Secondary | ICD-10-CM | POA: Diagnosis not present

## 2018-09-04 LAB — COMPREHENSIVE METABOLIC PANEL
ALT: 22 U/L (ref 0–53)
AST: 24 U/L (ref 0–37)
Albumin: 4.7 g/dL (ref 3.5–5.2)
Alkaline Phosphatase: 58 U/L (ref 39–117)
BILIRUBIN TOTAL: 0.7 mg/dL (ref 0.2–1.2)
BUN: 19 mg/dL (ref 6–23)
CHLORIDE: 102 meq/L (ref 96–112)
CO2: 32 meq/L (ref 19–32)
CREATININE: 0.94 mg/dL (ref 0.40–1.50)
Calcium: 9.7 mg/dL (ref 8.4–10.5)
GFR: 77.95 mL/min (ref >60.00)
GLUCOSE: 90 mg/dL (ref 70–99)
Potassium: 3.8 mEq/L (ref 3.5–5.1)
Sodium: 143 mEq/L (ref 135–145)
Total Protein: 7.8 g/dL (ref 6.0–8.3)

## 2018-09-04 LAB — LIPID PANEL
Cholesterol: 143 mg/dL (ref 0–200)
HDL: 38.3 mg/dL — AB (ref >39.00)
LDL Cholesterol: 71 mg/dL (ref 0–99)
NONHDL: 104.54
Total CHOL/HDL Ratio: 4
Triglycerides: 167 mg/dL — ABNORMAL HIGH (ref 0.0–149.0)
VLDL: 33.4 mg/dL (ref 0.0–40.0)

## 2018-09-04 LAB — PSA, MEDICARE: PSA: 0 ng/mL — AB (ref 0.10–4.00)

## 2018-09-04 LAB — CBC WITH DIFFERENTIAL/PLATELET
BASOS ABS: 0.1 10*3/uL (ref 0.0–0.1)
BASOS PCT: 0.7 % (ref 0.0–3.0)
EOS ABS: 0.5 10*3/uL (ref 0.0–0.7)
Eosinophils Relative: 6.2 % — ABNORMAL HIGH (ref 0.0–5.0)
HCT: 49 % (ref 39.0–52.0)
Hemoglobin: 16.3 g/dL (ref 13.0–17.0)
LYMPHS ABS: 2.6 10*3/uL (ref 0.7–4.0)
Lymphocytes Relative: 32.8 % (ref 12.0–46.0)
MCHC: 33.2 g/dL (ref 30.0–36.0)
MCV: 89.9 fl (ref 78.0–100.0)
Monocytes Absolute: 0.8 10*3/uL (ref 0.1–1.0)
Monocytes Relative: 10.4 % (ref 3.0–12.0)
NEUTROS ABS: 4 10*3/uL (ref 1.4–7.7)
NEUTROS PCT: 49.9 % (ref 43.0–77.0)
PLATELETS: 129 10*3/uL — AB (ref 150.0–400.0)
RBC: 5.44 Mil/uL (ref 4.22–5.81)
RDW: 14 % (ref 11.5–15.5)
WBC: 8 10*3/uL (ref 4.0–10.5)

## 2018-09-04 LAB — TSH: TSH: 2.99 u[IU]/mL (ref 0.35–4.50)

## 2018-09-04 NOTE — Progress Notes (Signed)
PCP notes:   Health maintenance:  No gaps identified.   Abnormal screenings:   Hearing - failed  Hearing Screening   125Hz  250Hz  500Hz  1000Hz  2000Hz  3000Hz  4000Hz  6000Hz  8000Hz   Right ear:   40 40 40  0    Left ear:   40 40 40  40      Patient concerns:   None  Nurse concerns:  None  Next PCP appt:   09/09/18 @ 0945  I reviewed health advisor's note, was available for consultation on the day of service listed in this note, and agree with documentation and plan. Elsie Stain, MD.

## 2018-09-04 NOTE — Progress Notes (Signed)
Subjective:   Ray Fowler is a 78 y.o. male who presents for Medicare Annual/Subsequent preventive examination.  Review of Systems:  N/A Cardiac Risk Factors include: advanced age (>70men, >79 women);dyslipidemia;hypertension;male gender     Objective:    Vitals: BP 140/60 (BP Location: Right Arm, Patient Position: Sitting, Cuff Size: Normal)   Pulse 66   Temp (!) 97.5 F (36.4 C) (Oral)   Ht 5' 11.5" (1.816 m) Comment: shoes  Wt 169 lb 4 oz (76.8 kg)   SpO2 98%   BMI 23.28 kg/m   Body mass index is 23.28 kg/m.  Advanced Directives 12/24/2017 12/20/2017 11/05/2017 08/21/2017 08/25/2016 08/21/2016 08/15/2016  Does Patient Have a Medical Advance Directive? Yes Yes Yes Yes Yes Yes Yes  Type of Paramedic of [place];Living will [place];Living will [place];Living will [place];Living will Healthcare Power of [place];Living will  Does patient want to make changes to medical advance directive? No - Patient declined No - Patient declined - - No - Patient declined - -  Copy of [place] in Chart? No - copy requested No - copy requested - No - copy requested No - copy requested - No - copy requested  Would patient like information on creating a medical advance directive? No - Patient declined No - Patient declined - - - - -  Pre-existing out of facility DNR order (yellow form or pink MOST form) - - - - - - -    Tobacco Social History   Tobacco Use  Smoking Status Former Smoker  . Packs/day: 1.00  . Years: 25.00  . Pack years: 25.00  . Types: Cigarettes  . Last attempt to quit: 07/10/1994  . Years since quitting: 24.1  Smokeless Tobacco Never Used  Tobacco Comment   quit 1995     Counseling given: No Comment: quit 1995   Clinical Intake:  Pre-visit preparation completed: Yes  Pain : No/denies pain Pain Score: 0-No pain      Nutritional Status: BMI of 19-24  Normal Nutritional Risks: None Diabetes: No  How often do you need to have someone help you when you read instructions, pamphlets, or other written materials from your doctor or pharmacy?: 1 - Never What is the last grade level you completed in school?: Bachelor degree  Interpreter Needed?: No  Comments: pt lives with spouse Information entered by :: LPinson, LPN  Past Medical History:  Diagnosis Date  . Allergic rhinitis   . Allergy   . 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 Arthritis   . BCC (basal cell carcinoma of skin) 2014  . Blood transfusion without reported diagnosis    he thinks 2010 with aortix dissection surgery  . Carotid bruit    Right. Carotid u/s 2/11 normal.  . Cataract 2020   bilateral eyes  . Complication of anesthesia   . Diverticulosis   . Family history of adverse reaction to anesthesia   . History of kidney stones    1980  . HTN (hypertension)   . Hyperlipidemia   . Irregular heart beat    " extra beat " per pt   . 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 Pneumonia    2018  . PONV (postoperative nausea and vomiting)   . Prostate cancer ([place])    1995  . Pulmonary nodule  Stable on CT 2/11  . S/P CABG x 1    for aneuryms repair  . Skin cancer    scalp 2007  melanoma   Past Surgical History:  Procedure Laterality Date  . ABDOMINAL AORTIC ANEURYSM REPAIR  march 2013  . 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 CARDIAC SURGERY  2013   at [facility]- transverse aortic arch/descending aneurysm repair s/p debranching procedure  (total of 5 Gore C-TAG devices extending from distal descending aorta to his prev placed ascending dacron graft)  . COLONOSCOPY    . Flex- Polyp   06/1999   at 38cm, Int/Ext Hemm divertics [facility])  . Hemi Arch Replacement  03/2009 and repeat 10/2011   and AoV resuspension w/ Repl of Ascending Aorta  and Hemiarch w/ Graft; Pericardial Tamponade Aortic Regurg 8/22-8/28/10;  endovascular repair of aortic dilation 2013 with  ascending aorta to L common carotid, R subclavian, R common carotid and L carotid subclavian bypass  . [facility]  08/22-8/28/2010   Thoracic aortic dissection pericardial teamponade aortic regurg  . INGUINAL HERNIA REPAIR Right 12/24/2017   Procedure: HERNIA REPAIR INGUINAL ADULT;  Surgeon: Greer Pickerel, MD;  Location: [facility];  Service: General;  Laterality: Right;  . INSERTION OF MESH Right 12/24/2017   Procedure: INSERTION OF MESH;  Surgeon: Greer Pickerel, MD;  Location: [facility];  Service: General;  Laterality: Right;  . melanoma removed from scalp  06/2006   [facility]  . PENILE PROSTHESIS IMPLANT  1996  . POLYPECTOMY    . PROSTATECTOMY  12/1993   Dr. Risa Grill  . WISDOM TOOTH EXTRACTION  10/2005   Family History  Problem Relation Age of Onset  . Heart failure Mother        died age 1  . Hypertension Mother   . Heart disease Mother        CHF  . Kidney failure Father   . Heart disease Father        coronary disease, CABG  . Stroke Father        due to "infection" from [place]  . Kidney disease Father        chronic  . Prostate cancer Father   . Diabetes Other   . Colon polyps Sister   . Diabetes Other   . Hyperlipidemia Daughter   . Colon cancer Neg Hx   . Esophageal cancer Neg Hx   . Stomach cancer Neg Hx   . Rectal cancer Neg Hx    Social History   Socioeconomic History  . Marital status: Married    Spouse name: Not on file  . Number of children: 3  . Years of education: Not on file  . Highest education level: Not on file  Occupational History  . Occupation: Retired    Fish farm manager: RETIRED    Comment: [employer] in [place]  . Financial resource strain: Not on file  . Food insecurity:    Worry: Not on file    Inability: Not on file  . Transportation needs:    Medical: Not on file    Non-medical: Not on file   Tobacco Use  . Smoking status: Former Smoker    Packs/day: 1.00    Years: 25.00    Pack years: 25.00    Types: Cigarettes    Last attempt to quit: 07/10/1994    Years since quitting: 24.1  . Smokeless tobacco: Never Used  . Tobacco comment: quit 1995  Substance and Sexual Activity  . Alcohol use: Yes    Comment: occassionally wine  . Drug use: Never  . Sexual activity: Yes  Lifestyle  . Physical activity:    Days per week: Not on file    Minutes per session: Not on file  . Stress: Not on file  Relationships  . Social connections:    Talks on phone: Not on file    Gets together: Not on file    Attends religious service: Not on file    Active member of club or organization: Not on file    Attends meetings of clubs or organizations: Not on file    Relationship status: Not on file  Other Topics Concern  . Not on file  Social History Narrative   Married 1964   Retired from [employer] in ~2008    Outpatient Encounter Medications as of 09/04/2018  Medication Sig  . albuterol (PROVENTIL HFA;VENTOLIN HFA) 108 (90 Base) MCG/ACT inhaler Inhale 2 puffs into the lungs every 6 (six) hours as needed for wheezing or shortness of breath.  Marland Kitchen amLODipine (NORVASC) 5 MG tablet Take 0.5 tablets (2.5 mg total) by mouth 2 (two) times daily. (Patient taking differently: Take 2.5 mg by mouth daily. )  . atorvastatin (LIPITOR) 20 MG tablet TAKE 1 TABLET BY MOUTH AT BEDTIME  . FLOVENT HFA 110 MCG/ACT inhaler INHALE ONE PUFF BY MOUTH TWICE DAILY. RINSE MOUTH AFTER USE.. (Patient taking differently: Inhale 1 puff by mouth twice daily as needed for shortness of breath or wheezing)  . fluticasone (FLONASE) 50 MCG/ACT nasal spray Use 1 spray in each nostril daily as needed for congestion  . guaiFENesin (MUCINEX) 600 MG 12 hr tablet Take 600 mg by mouth 2 (two) times daily as needed for cough or to loosen phlegm.   . hydrochlorothiazide (HYDRODIURIL) 25 MG tablet Take 1 tablet (25 mg total) by mouth every  other day.  . Lutein-Zeaxanthin 25-5 MG CAPS Take 1 capsule by mouth daily.  . meclizine (ANTIVERT) 25 MG tablet Take 1 tablet (25 mg total) by mouth 3 (three) times daily as needed. (Patient taking differently: Take 25 mg by mouth 3 (three) times daily as needed for dizziness. )  . Multiple Vitamin (MULITIVITAMIN WITH MINERALS) TABS Take 1 tablet by mouth daily.  . Nebivolol HCl (BYSTOLIC) 20 MG TABS Take 1 tablet (20 mg total) by mouth daily.  . promethazine (PHENERGAN) 25 MG tablet Take 1 tablet (25 mg total) by mouth every 6 (six) hours as needed for nausea or vomiting.   No facility-administered encounter medications on file as of 09/04/2018.     Activities of Daily Living In your present state of health, do you have any difficulty performing the following activities: 09/04/2018 12/20/2017  Hearing? N N  Vision? N N  Comment - glasses  Difficulty concentrating or making decisions? N N  Walking or climbing stairs? N N  Dressing or bathing? N N  Doing errands, shopping? N N  Preparing Food and eating ? N -  Using the Toilet? N -  In the past six months, have you accidently leaked urine? Y -  Do you have problems with loss of bowel control? N -  Managing your Medications? N -  Managing your Finances? N -  Housekeeping or managing your Housekeeping? N -  Some recent data might be hidden    Patient Care Team: Tonia Ghent, MD as PCP - General Rockey Situ Kathlene November, MD (Cardiology) Sharyne Peach, MD as Consulting Physician (  Ophthalmology) Rana Snare, MD as Consulting Physician (Urology) Druscilla Brownie, MD as Consulting Physician (Dermatology) Anson Crofts, MD as Attending Physician (Cardiothoracic Surgery)   Assessment:   This is a routine wellness examination for [place].   Hearing Screening   125Hz  250Hz  500Hz  1000Hz  2000Hz  3000Hz  4000Hz  6000Hz  8000Hz   Right ear:   40 40 40  0    Left ear:   40 40 40  40    Vision Screening Comments: Vision exam in 2020 with Dr.  Maurie Boettcher   Exercise Activities and Dietary recommendations Current Exercise Habits: Home exercise routine, Type of exercise: Other - see comments(elliptical bike), Time (Minutes): 40, Frequency (Times/Week): 3, Weekly Exercise (Minutes/Week): 120, Intensity: Moderate, Exercise limited by: None identified  Goals    . Increase physical activity     Starting 09/04/18, I will continue to exercise at least 30-40 min 3 days per week.        Fall Risk Fall Risk  09/04/2018 08/21/2017 08/15/2016 08/11/2015 07/30/2013  Falls in the past year? 0 No No No No   Depression Screen PHQ 2/9 Scores 09/04/2018 08/21/2017 08/09/2017 08/15/2016  PHQ - 2 Score 0 0 0 0  PHQ- 9 Score 0 0 - -    Cognitive Function MMSE - Mini Mental State Exam 09/04/2018 08/21/2017 08/15/2016  Orientation to time 5 5 5   Orientation to Place 5 5 5   Registration 3 3 3   Attention/ Calculation 0 0 0  Recall 3 3 3   Language- name 2 objects 0 0 0  Language- repeat 1 1 1   Language- follow 3 step command 3 3 3   Language- read & follow direction 0 0 0  Write a sentence 0 0 0  Copy design 0 0 0  Total score 20 20 20      PLEASE NOTE: A Mini-Cog screen was completed. Maximum score is 20. A value of 0 denotes this part of [place] MMSE was not completed or the patient failed this part of the Mini-Cog screening.   Mini-Cog Screening Orientation to Time - Max 5 pts Orientation to Place - Max 5 pts Registration - Max 3 pts Recall - Max 3 pts Language Repeat - Max 1 pts Language Follow 3 Step Command - Max 3 pts     Immunization History  Administered Date(s) Administered  . Influenza Split 07/11/2011, 05/08/2012  . Influenza Whole 06/11/2007, 06/15/2008, 05/30/2009, 06/29/2010  . Influenza,inj,Quad PF,6+ Mos 06/25/2013, 04/15/2014, 04/20/2015, 05/02/2016, 05/28/2017, 05/14/2018  . Pneumococcal Conjugate-13 08/09/2014  . Pneumococcal Polysaccharide-23 06/28/2009, 05/09/2010  . Td 07/28/2004  . Zoster 06/19/2007    Screening  Tests Health Maintenance  Topic Date Due  . TETANUS/TDAP  07/27/2024 (Originally 07/28/2014)  . INFLUENZA VACCINE  Completed  . PNA vac Low Risk Adult  Completed     Plan:     I have personally reviewed, addressed, and noted the following in the patient's chart:  A. Medical and social history B. Use of alcohol, tobacco or illicit drugs  C. Current medications and supplements D. Functional ability and status E.  Nutritional status F.  Physical activity G. Advance directives H. List of other physicians I.  Hospitalizations, surgeries, and ER visits in previous 12 months J.  [place] to include hearing, vision, cognitive, depression L. Referrals and appointments - none  In addition, I have reviewed and discussed with patient certain preventive protocols, quality metrics, and best practice recommendations. A written personalized care plan for preventive services as well as general preventive health  recommendations were provided to patient.  See attached scanned questionnaire for additional information.   Signed,   Lindell Noe, MHA, BS, LPN Health Coach

## 2018-09-04 NOTE — Patient Instructions (Signed)
Ray Fowler , Thank you for taking time to come for your Medicare Wellness Visit. I appreciate your ongoing commitment to your health goals. Please review the following plan we discussed and let me know if I can assist you in the future.   These are the goals we discussed: Goals    . Increase physical activity     Starting 09/04/18, I will continue to exercise at least 30-40 min 3 days per week.        This is a list of the screening recommended for you and due dates:  Health Maintenance  Topic Date Due  . Tetanus Vaccine  07/27/2024*  . Flu Shot  Completed  . Pneumonia vaccines  Completed  *Topic was postponed. The date shown is not the original due date.   Preventive Care for Adults  A healthy lifestyle and preventive care can promote health and wellness. Preventive health guidelines for adults include the following key practices.  . A routine yearly physical is a good way to check with your health care provider about your health and preventive screening. It is a chance to share any concerns and updates on your health and to receive a thorough exam.  . Visit your dentist for a routine exam and preventive care every 6 months. Brush your teeth twice a day and floss once a day. Good oral hygiene prevents tooth decay and gum disease.  . The frequency of eye exams is based on your age, health, family medical history, use  of contact lenses, and other factors. Follow your health care provider's recommendations for frequency of eye exams.  . Eat a healthy diet. Foods like vegetables, fruits, whole grains, low-fat dairy products, and lean protein foods contain the nutrients you need without too many calories. Decrease your intake of foods high in solid fats, added sugars, and salt. Eat the right amount of calories for you. Get information about a proper diet from your health care provider, if necessary.  . Regular physical exercise is one of the most important things you can do for your  health. Most adults should get at least 150 minutes of moderate-intensity exercise (any activity that increases your heart rate and causes you to sweat) each week. In addition, most adults need muscle-strengthening exercises on 2 or more days a week.  Silver Sneakers may be a benefit available to you. To determine eligibility, you may visit the website: www.silversneakers.com or contact program at 516-398-0200 Mon-Fri between 8AM-8PM.   . Maintain a healthy weight. The body mass index (BMI) is a screening tool to identify possible weight problems. It provides an estimate of body fat based on height and weight. Your health care provider can find your BMI and can help you achieve or maintain a healthy weight.   For adults 20 years and older: ? A BMI below 18.5 is considered underweight. ? A BMI of 18.5 to 24.9 is normal. ? A BMI of 25 to 29.9 is considered overweight. ? A BMI of 30 and above is considered obese.   . Maintain normal blood lipids and cholesterol levels by exercising and minimizing your intake of saturated fat. Eat a balanced diet with plenty of fruit and vegetables. Blood tests for lipids and cholesterol should begin at age 76 and be repeated every 5 years. If your lipid or cholesterol levels are high, you are over 50, or you are at high risk for heart disease, you may need your cholesterol levels checked more frequently. Ongoing high lipid and cholesterol  levels should be treated with medicines if diet and exercise are not working.  . If you smoke, find out from your health care provider how to quit. If you do not use tobacco, please do not start.  . If you choose to drink alcohol, please do not consume more than 2 drinks per day. One drink is considered to be 12 ounces (355 mL) of beer, 5 ounces (148 mL) of wine, or 1.5 ounces (44 mL) of liquor.  . If you are 88-103 years old, ask your health care provider if you should take aspirin to prevent strokes.  . Use sunscreen. Apply  sunscreen liberally and repeatedly throughout the day. You should seek shade when your shadow is shorter than you. Protect yourself by wearing long sleeves, pants, a wide-brimmed hat, and sunglasses year round, whenever you are outdoors.  . Once a month, do a whole body skin exam, using a mirror to look at the skin on your back. Tell your health care provider of new moles, moles that have irregular borders, moles that are larger than a pencil eraser, or moles that have changed in shape or color.

## 2018-09-09 ENCOUNTER — Ambulatory Visit (INDEPENDENT_AMBULATORY_CARE_PROVIDER_SITE_OTHER): Payer: Medicare Other | Admitting: Family Medicine

## 2018-09-09 ENCOUNTER — Encounter: Payer: Self-pay | Admitting: Family Medicine

## 2018-09-09 VITALS — BP 142/80 | HR 63 | Temp 98.4°F | Ht 71.5 in | Wt 169.2 lb

## 2018-09-09 DIAGNOSIS — I493 Ventricular premature depolarization: Secondary | ICD-10-CM | POA: Diagnosis not present

## 2018-09-09 DIAGNOSIS — H8110 Benign paroxysmal vertigo, unspecified ear: Secondary | ICD-10-CM

## 2018-09-09 DIAGNOSIS — D696 Thrombocytopenia, unspecified: Secondary | ICD-10-CM | POA: Diagnosis not present

## 2018-09-09 DIAGNOSIS — Z9889 Other specified postprocedural states: Secondary | ICD-10-CM | POA: Diagnosis not present

## 2018-09-09 DIAGNOSIS — Z7189 Other specified counseling: Secondary | ICD-10-CM

## 2018-09-09 DIAGNOSIS — E785 Hyperlipidemia, unspecified: Secondary | ICD-10-CM

## 2018-09-09 DIAGNOSIS — I10 Essential (primary) hypertension: Secondary | ICD-10-CM | POA: Diagnosis not present

## 2018-09-09 DIAGNOSIS — J432 Centrilobular emphysema: Secondary | ICD-10-CM

## 2018-09-09 DIAGNOSIS — Z0000 Encounter for general adult medical examination without abnormal findings: Secondary | ICD-10-CM

## 2018-09-09 MED ORDER — MECLIZINE HCL 25 MG PO TABS: 25.0000 mg | ORAL_TABLET | Freq: Three times a day (TID) | ORAL | Status: AC | PRN

## 2018-09-09 MED ORDER — FLUTICASONE PROPIONATE HFA 110 MCG/ACT IN AERO: INHALATION_SPRAY | RESPIRATORY_TRACT | Status: DC

## 2018-09-09 NOTE — Progress Notes (Signed)
Declined hearing aids.   Colonoscopy 2019 PSA still zero.  d/w pt.  Advance directive- wife designated if patient were incapacitated.  Shingles d/w pt.  Out of stock.   Flu 2019 PNA up to date.  Tetanus d/w pt.  See avs.    Hypertension:    Using medication without problems or lightheadedness: yes Chest pain with exertion:no Edema:no Short of breath:no  Mild dec in PLT, no bleeding.  Labs discussed with patient.  Aorta changes.  Status post repair.  Followed at [facility].  He has f/u pending in 10/2018.    Rare vertigo, no sx now.  Seems to happen in the fall of the year.  He will update me as needed.  Elevated Cholesterol: Using medications without problems:yes Muscle aches: not likely from statin.   Diet compliance: yes Exercise: yes  "I can't remember the last time I used albuterol."  Still using flovent daily, rinsing after use, no ADE, with relief.  Used during allergy season with relief.    Meds, vitals, and allergies reviewed.   PMH and SH reviewed  ROS: Per HPI unless specifically indicated in ROS section   GEN: nad, alert and oriented HEENT: mucous membranes moist NECK: supple w/o LA CV: rrr with occ ectopy noted.  PULM: ctab, no inc wob ABD: soft, +bs EXT: no edema SKIN: no acute rash but he has some irritation on the bridge of the nose and will f/u with derm if not resolved.

## 2018-09-09 NOTE — Patient Instructions (Addendum)
Check with your insurance to see if they will cover the shingrix shot. Tetanus shot may be cheaper at the pharmacy.  Don't change your meds for now.  Update me as needed.  Take care.  Glad to see you.  I'll await your [facility] notes.  If the spot doesn't heal in the next few days, then please see cardiology.

## 2018-09-10 DIAGNOSIS — D696 Thrombocytopenia, unspecified: Secondary | ICD-10-CM | POA: Insufficient documentation

## 2018-09-10 NOTE — Assessment & Plan Note (Addendum)
No change in meds.  Continue work on diet and exercise.  He agrees.  Labs discussed with patient. >25 minutes spent in face to face time with patient, >50% spent in counselling or coordination of care.

## 2018-09-10 NOTE — Assessment & Plan Note (Signed)
Rare vertigo, no sx now.  Seems to happen in the fall of the year.  He will update me as needed.

## 2018-09-10 NOTE — Assessment & Plan Note (Signed)
Continue statin.  Continue work on diet and exercise.  He agrees.  Labs discussed with patient. 

## 2018-09-10 NOTE — Assessment & Plan Note (Signed)
Symptoms are controlled. "I can't remember the last time I used albuterol."  Still using flovent daily, rinsing after use, no ADE, with relief.  Used during allergy season with relief.

## 2018-09-10 NOTE — Assessment & Plan Note (Signed)
Occasionally noted.  Not symptomatic.  Continue beta-blocker.  Discussed with patient.

## 2018-09-10 NOTE — Assessment & Plan Note (Signed)
Mild dec in PLT, no bleeding.  Labs discussed with patient.

## 2018-09-10 NOTE — Assessment & Plan Note (Signed)
Continue routine medication for blood pressure control.  He will follow-up with [facility].  I appreciate the help of all involved.

## 2018-09-10 NOTE — Assessment & Plan Note (Signed)
Declined hearing aids.   Colonoscopy 2019 PSA still zero.  d/w pt.  Advance directive- wife designated if patient were incapacitated.  Shingles d/w pt.  Out of stock.   Flu 2019 PNA up to date.  Tetanus d/w pt.  See avs.

## 2018-09-10 NOTE — Assessment & Plan Note (Signed)
Advance directive- wife designated if patient were incapacitated.  

## 2018-10-07 ENCOUNTER — Other Ambulatory Visit: Payer: Self-pay | Admitting: Family Medicine

## 2018-10-29 ENCOUNTER — Telehealth: Payer: Self-pay | Admitting: Family Medicine

## 2018-10-29 NOTE — Telephone Encounter (Signed)
Imaging previously done at [facility].  Incidentally noted indeterminate exophytic posterior right renal lesion is stable from prior but increased in size from remote priors. Recommend dedicated renal imaging via MRI or CT RCC protocol.   Per [facility] notes: Netta Corrigan, NP - 10/28/2018 12:09 PM EDT Spoke to Ray Fowler in regards to radiology recommendation for dedicated renal imaging to f/u right kidney lesion on recent CTA. He is hopeful to arrange f/u through his local urologist Dr. Bernardo Heater so will fax recent report to his office. Should this not be arranged I let him know I could refer to urologist here for further evaluation. Netta Corrigan, NP    ============= I will defer to [facility] clinic, urology, patient.  I appreciate the help of all involved.

## 2019-01-02 ENCOUNTER — Ambulatory Visit: Payer: Medicare Other | Admitting: Urology

## 2019-01-06 ENCOUNTER — Ambulatory Visit: Payer: Medicare Other | Admitting: Urology

## 2019-01-13 ENCOUNTER — Other Ambulatory Visit: Payer: Self-pay

## 2019-01-13 ENCOUNTER — Ambulatory Visit: Payer: Medicare Other | Admitting: Urology

## 2019-01-13 ENCOUNTER — Encounter: Payer: Self-pay | Admitting: Urology

## 2019-01-13 DIAGNOSIS — N2889 Other specified disorders of kidney and ureter: Secondary | ICD-10-CM

## 2019-01-15 ENCOUNTER — Encounter: Payer: Self-pay | Admitting: Urology

## 2019-01-15 NOTE — Progress Notes (Signed)
01/13/2019 10:10 AM   Ray Fowler 11-10-1940 [id number]  Referring provider: Tonia Ghent, MD [address] [place],  [address]  Chief Complaint  Patient presents with  . 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  I saw him January 2019 and he was doing well.  He was referred for abnormal renal lesion identified on CTA chest abdomen pelvis at [facility] which was performed in February 2020.  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  His CT images were not available for review however in reviewing the radiology report his CT in 2020 showed large right renal cysts.  There was an area in the right posterior kidney which was felt to be indeterminate.  It had >10 HU arterial enhancement. On a scan of 2018 the size was measured at 2.1 cm and was felt unchanged from 2016.  It measured 1.4 cm and 2014 and 2015.  On earlier scans it was felt to be a hemorrhagic cyst.  Radiology recommended dedicated renal imaging with either CT or MRI.  He denies flank, abdominal pain or gross hematuria.   PMH: Past Medical History:  Diagnosis Date  . Allergic rhinitis   . Allergy   . 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 Arthritis   . BCC (basal cell carcinoma of skin) 2014  . Blood transfusion without reported diagnosis    he thinks 2010 with aortix dissection surgery  . Carotid bruit    Right. Carotid u/s 2/11 normal.  . Cataract 2020   bilateral eyes  . Complication of anesthesia   . Diverticulosis   . Family history of adverse reaction to anesthesia   . History of kidney stones    1980  . HTN (hypertension)   . Hyperlipidemia   . Irregular heart beat    " extra beat " per pt   . 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 Pneumonia    2018  .  PONV (postoperative nausea and vomiting)   . Prostate cancer ([place])    1995  . Pulmonary nodule    Stable on CT 2/11  . S/P CABG x 1    for aneuryms repair  . Skin cancer    scalp 2007  melanoma    Surgical History: Past Surgical History:  Procedure Laterality Date  . ABDOMINAL AORTIC ANEURYSM REPAIR  march 2013  . 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 CARDIAC SURGERY  2013   at [facility]- transverse aortic arch/descending aneurysm repair s/p debranching procedure  (total of 5 Gore C-TAG devices extending from distal descending aorta to his prev placed ascending dacron graft)  . COLONOSCOPY    . Flex- Polyp   06/1999   at 38cm, Int/Ext Hemm divertics [facility])  . Hemi Arch Replacement  03/2009 and repeat 10/2011   and AoV resuspension w/ Repl of Ascending Aorta and Hemiarch w/ Graft; Pericardial Tamponade Aortic Regurg 8/22-8/28/10;  endovascular repair of aortic dilation 2013 with  ascending aorta to L common carotid, R subclavian, R common carotid and L carotid subclavian bypass  . [facility]  08/22-8/28/2010   Thoracic aortic dissection pericardial teamponade aortic regurg  . INGUINAL HERNIA REPAIR Right 12/24/2017   Procedure: HERNIA REPAIR INGUINAL ADULT;  Surgeon:  Greer Pickerel, MD;  Location: [facility];  Service: General;  Laterality: Right;  . INSERTION OF MESH Right 12/24/2017   Procedure: INSERTION OF MESH;  Surgeon: Greer Pickerel, MD;  Location: [facility];  Service: General;  Laterality: Right;  . melanoma removed from scalp  06/2006   [place]  . PENILE PROSTHESIS IMPLANT  1996  . POLYPECTOMY    . PROSTATECTOMY  12/1993   Dr. Risa Grill  . WISDOM TOOTH EXTRACTION  10/2005    Home Medications:  Allergies as of 01/13/2019      Reactions   Amlodipine Other (See Comments)   Edema at 10mg  a day   Lactose Intolerance (gi) Diarrhea, Other (See Comments)   Bloating, gas      Medication List       Accurate as of January 13, 2019 11:59 PM. If  you have any questions, ask your nurse or doctor.        albuterol 108 (90 Base) MCG/ACT inhaler Commonly known as:  VENTOLIN HFA Inhale 2 puffs into the lungs every 6 (six) hours as needed for wheezing or shortness of breath.   amLODipine 5 MG tablet Commonly known as:  NORVASC Take 5 mg by mouth daily.   atorvastatin 20 MG tablet Commonly known as:  LIPITOR TAKE 1 TABLET BY MOUTH AT BEDTIME   fluticasone 110 MCG/ACT inhaler Commonly known as:  Flovent HFA INHALE 1 PUFF BY MOUTH TWICE DAILY RINSE MOUTH AFTER USE   fluticasone 50 MCG/ACT nasal spray Commonly known as:  FLONASE Use 1 spray in each nostril daily as needed for congestion   guaiFENesin 600 MG 12 hr tablet Commonly known as:  MUCINEX Take 600 mg by mouth 2 (two) times daily as needed for cough or to loosen phlegm.   hydrochlorothiazide 25 MG tablet Commonly known as:  HYDRODIURIL Take 1 tablet (25 mg total) by mouth every other day.   Lutein-Zeaxanthin 25-5 MG Caps Take 1 capsule by mouth daily.   meclizine 25 MG tablet Commonly known as:  ANTIVERT Take 1 tablet (25 mg total) by mouth 3 (three) times daily as needed for dizziness.   multivitamin with minerals Tabs tablet Take 1 tablet by mouth daily.   Nebivolol HCl 20 MG Tabs Commonly known as:  Bystolic Take 1 tablet (20 mg total) by mouth daily.   promethazine 25 MG tablet Commonly known as:  PHENERGAN Take 1 tablet (25 mg total) by mouth every 6 (six) hours as needed for nausea or vomiting.       Allergies:  Allergies  Allergen Reactions  . Amlodipine Other (See Comments)    Edema at 10mg  a day  . Lactose Intolerance (Gi) Diarrhea and Other (See Comments)    Bloating, gas    Family History: Family History  Problem Relation Age of Onset  . Heart failure Mother        died age 63  . Hypertension Mother   . Heart disease Mother        CHF  . Kidney failure Father   . Heart disease Father        coronary disease, CABG  . Stroke  Father        due to "infection" from [place]  . Kidney disease Father        chronic  . Prostate cancer Father   . Diabetes Other   . Colon polyps Sister   . Diabetes Other   . Hyperlipidemia Daughter   . Colon cancer Neg Hx   .  Esophageal cancer Neg Hx   . Stomach cancer Neg Hx   . Rectal cancer Neg Hx     Social History:  reports that he quit smoking about 24 years ago. His smoking use included cigarettes. He has a 25.00 pack-year smoking history. He has never used smokeless tobacco. He reports current alcohol use. He reports that he does not use drugs.  ROS: UROLOGY Frequent Urination?: No Hard to postpone urination?: No Burning/pain with urination?: No Get up at night to urinate?: Yes Leakage of urine?: No Urine stream starts and stops?: No Trouble starting stream?: No Do you have to strain to urinate?: No Blood in urine?: No Urinary tract infection?: No Sexually transmitted disease?: No Injury to kidneys or bladder?: No Painful intercourse?: No Weak stream?: No Erection problems?: No Penile pain?: No  Gastrointestinal Nausea?: No Vomiting?: No Indigestion/heartburn?: No Diarrhea?: No Constipation?: No  Constitutional Fever: No Night sweats?: No Weight loss?: No Fatigue?: No  Skin Skin rash/lesions?: No Itching?: No  Eyes Blurred vision?: No Double vision?: No  Ears/Nose/Throat Sore throat?: No Sinus problems?: Yes  Hematologic/Lymphatic Swollen glands?: No Easy bruising?: No  Cardiovascular Leg swelling?: No Chest pain?: No  Respiratory Cough?: No Shortness of breath?: No  Endocrine Excessive thirst?: No  Musculoskeletal Back pain?: No Joint pain?: No  Neurological Headaches?: No Dizziness?: No  Psychologic Depression?: No Anxiety?: No  Physical Exam: BP (!) 160/81 (BP Location: Left Arm, Patient Position: Sitting, Cuff Size: Normal)   Pulse 73   Ht 6' (1.829 m)   Wt 169 lb 14.4 oz (77.1 kg)   BMI 23.04 kg/m    Constitutional:  Alert and oriented, No acute distress.  Assessment & Plan:   78 year old male with a 2.1 cm indeterminate lesion posterior right kidney.  I recommended scheduling a renal mass protocol MRI.  He will be notified with results and further recommendations.  Abbie Sons, [place] [address], [place], [place] [address] [phone]

## 2019-01-26 ENCOUNTER — Ambulatory Visit (INDEPENDENT_AMBULATORY_CARE_PROVIDER_SITE_OTHER): Payer: Medicare Other | Admitting: Family Medicine

## 2019-01-26 DIAGNOSIS — N289 Disorder of kidney and ureter, unspecified: Secondary | ICD-10-CM

## 2019-01-26 DIAGNOSIS — J029 Acute pharyngitis, unspecified: Secondary | ICD-10-CM

## 2019-01-26 MED ORDER — FAMOTIDINE 20 MG PO TABS: 20.0000 mg | ORAL_TABLET | Freq: Every day | ORAL | Status: DC | PRN

## 2019-01-26 NOTE — Progress Notes (Signed)
Interactive audio and video telecommunications were attempted between this provider and patient, however failed, due to patient having technical difficulties OR patient did not have access to video capability.  We continued and completed visit with audio only.   Virtual Visit via Telephone Note  I connected with patient on 01/26/19  at 12:54 PM  by telephone and verified that I am speaking with the correct person using two identifiers.  Location of patient: home.   Location of MD: [facility] Name of referring provider (if blank then none associated): Names per persons and role in encounter:  MD: Earlyne Iba, Patient: name listed above.    I discussed the limitations, risks, security and privacy concerns of performing an evaluation and management service by telephone and the availability of in person appointments. I also discussed with the patient that there may be a patient responsible charge related to this service. The patient expressed understanding and agreed to proceed.  History of Present Illness:   I will check with [employer] to see if his hardware is MRI compatible-staff message sent.  He isn't seeing any blood in urine.    No fever.  BP is controlled on home check.  129/54 BP on home check.    ST.  3 nights ago woke up with "acid reflux incident."  Not exertional.  Persists.  He was laying down, regurgitated and woke up with sx.  He didn't vomiting.  Wasn't SOB.  No heartburn o/w.  No fevers. No covid exposures. He is masking/distancing.  No blood in stool, no vomiting.  Posterior throat pain but not oral pain o/w. No lesions seen in throat per patient report.     Observations/Objective: No apparent distress Speech normal  Assessment and Plan: This could be a GERD issue. D/w pt about shallow wedge pillow and pepcid 20mg  vs TUMS.  He will update me as needed.  Follow Up Instructions: See above.   I discussed the assessment and treatment plan with the patient. The patient  was provided an opportunity to ask questions and all were answered. The patient agreed with the plan and demonstrated an understanding of the instructions.   The patient was advised to call back or seek an in-person evaluation if the symptoms worsen or if the condition fails to improve as anticipated.  I provided 25 minutes of non-face-to-face time during this encounter.  Elsie Stain, MD

## 2019-01-27 ENCOUNTER — Ambulatory Visit: Payer: Medicare Other

## 2019-01-28 ENCOUNTER — Telehealth: Payer: Self-pay | Admitting: Family Medicine

## 2019-01-28 ENCOUNTER — Telehealth: Payer: Self-pay | Admitting: Urology

## 2019-01-28 DIAGNOSIS — J029 Acute pharyngitis, unspecified: Secondary | ICD-10-CM | POA: Insufficient documentation

## 2019-01-28 DIAGNOSIS — N2889 Other specified disorders of kidney and ureter: Secondary | ICD-10-CM

## 2019-01-28 DIAGNOSIS — N200 Calculus of kidney: Secondary | ICD-10-CM | POA: Insufficient documentation

## 2019-01-28 DIAGNOSIS — N289 Disorder of kidney and ureter, unspecified: Secondary | ICD-10-CM | POA: Insufficient documentation

## 2019-01-28 MED ORDER — AMOXICILLIN-POT CLAVULANATE 875-125 MG PO TABS: 1.0000 | ORAL_TABLET | Freq: Two times a day (BID) | ORAL | 0 refills | Status: DC

## 2019-01-28 NOTE — Telephone Encounter (Signed)
I spoke with pt; pt throat is not swollen and pt not having any problem sleeping. Pt said S/T got worse and more painful on 01/27/19. Pt does not see any white patches at back of throat. At back of throat appears darker red.Pt feels like drainage at back of throat and pt is constantly clearing his throat.  No fever, no cough,no chills,no SOB,no muscle pain, no diarrhea, no H/a and no loss taste/smell. No travel and no known exposure to covid. [facility]. Pt said he does not want to go thru weekend like this. Pt will ck with [facility] later this evening for med otherwise pt request cb on 01/29/19.

## 2019-01-28 NOTE — Assessment & Plan Note (Signed)
Noted on outside imaging at [facility] clinic.  Indeterminate exophytic posterior right renal lesion is stable from prior but increased in size from remote priors. Recommend dedicated renal imaging via MRI or CT RCC protocol.  I will check with [employer] to see if his hardware is MRI compatible-staff message sent.  He isn't seeing any blood in urine.

## 2019-01-28 NOTE — Telephone Encounter (Signed)
Called pt.  No fever and not SOB.  Still with ST but no asymmetry when patient examined his throat.  Discussed options.  He has been having symptoms that have been getting progressively worse for about 10 days now.  It may be that he could have a sinus infection causing postnasal drip.  He is already treated for allergies with Flonase.  He is already treated for GERD.  He could have oral irritation related to Flovent.  He is using that at night and rinsing after use.  Discussed options.  Start Augmentin, hold Flovent for 3 days.  Update me as needed.  Routine cautions given.  He agrees.  Okay for outpatient follow-up.

## 2019-01-28 NOTE — Assessment & Plan Note (Signed)
This could be a GERD issue. D/w pt about shallow wedge pillow and pepcid 20mg  vs TUMS.  He will update me as needed.

## 2019-01-28 NOTE — Telephone Encounter (Signed)
They called back and said they would not be able to scan him.   Sharyn Lull

## 2019-01-28 NOTE — Telephone Encounter (Signed)
Please triage this and let me know about this sx.  I'll work on it.  Many thanks.

## 2019-01-28 NOTE — Telephone Encounter (Signed)
Patient had a virtual office visit with Dr.Duncan on Monday.  Patient said he spoke to [place] about a sore throat.  Patient thought it might be acid reflux.  Patient said the sore throat has gotten worse and swallowing is painful.  Patient thinks it might be sinus drainage.  Patient uses [facility] or [facility]. Please advise.

## 2019-01-28 NOTE — Telephone Encounter (Signed)
After speaking with the MRI dept and the patient they are unable to schedule him for the MRI you ordered due to his penile implant. I called the patient and asked him about when, where and who did the implant and he did not have any information to give me and did not want to try and help me with any of it. He said it was up to me to figure it out? I sent a fax to [facility] records asking them for any information on the patient from 1996 where he had been to [facility] for a penile implant and they stated that he had not been a patient their and they had no records on him. After speaking with the tech in MRI again today I was told that they would not be able to perform a MRI on this patient. Please advise on what you would like to do. Again I tried to get information from the patient but no help.  I just got a call from MRI and they are going to do some digging and see what they are able to do so I will let you know, so now they are now maybe able to do it?? Not sure   Sharyn Lull

## 2019-01-28 NOTE — Telephone Encounter (Signed)
Will have to do a renal mass protocol CT.  Order was entered

## 2019-02-10 ENCOUNTER — Telehealth: Payer: Self-pay | Admitting: Family Medicine

## 2019-02-10 NOTE — Telephone Encounter (Signed)
Pt dropped off information from [facility]. He just wants Dr. Damita Dunnings to review. Placed in [place] tower.

## 2019-02-12 ENCOUNTER — Other Ambulatory Visit: Payer: Self-pay

## 2019-02-12 ENCOUNTER — Ambulatory Visit
Admission: RE | Admit: 2019-02-12 | Discharge: 2019-02-12 | Disposition: A | Discharge status: Home / Self Care | Payer: Medicare Other | Source: Ambulatory Visit | Attending: Urology | Admitting: Urology

## 2019-02-12 ENCOUNTER — Encounter: Payer: Self-pay | Admitting: Urology

## 2019-02-12 DIAGNOSIS — N2889 Other specified disorders of kidney and ureter: Secondary | ICD-10-CM | POA: Diagnosis present

## 2019-02-12 HISTORY — DX: Heart failure, unspecified: I50.9

## 2019-02-12 LAB — POCT I-STAT CREATININE: Creatinine, Ser: 0.9 mg/dL (ref 0.61–1.24)

## 2019-02-12 MED ORDER — IOHEXOL 300 MG/ML  SOLN
100.0000 mL | Freq: Once | INTRAMUSCULAR | Status: AC | PRN
  Administered 2019-02-12: 100 mL via INTRAVENOUS

## 2019-02-12 NOTE — Telephone Encounter (Signed)
Noted. Thanks.

## 2019-04-26 NOTE — Progress Notes (Signed)
Cardiology Office Note  Date:  04/28/2019   ID:  Ray Fowler, Ray Fowler 1941-06-09, MRN [id number]  PCP:  Tonia Ghent, MD   Chief Complaint  Patient presents with  . other    12 month f/u no complaints today. Meds reviewed verbally with pt.    HPI:  Ray Fowler is a 78 y/o male with h/o HTN,  hyperlipidemia,  Type A aortic dissection in [id number] complicated by cardiac tamponade,  CT showed extension of the dissection from the aortic root all the way down to the femoral arteries,  s/p aortic root replacement of hemiarch repair and aortic valve resuspension.  Post-op course complicated by persistent HTN and atrial flutter treated with amio (stopped in January 2011).  He has followup with cardiothoracic surgery at [facility] on an annual basis with CT scan surveillance. CT September 2014 showing remodeling of his thoracic aorta, overall looked good/stable. He presents for routine followup of his of his hypertension And aortic dissection  Chronic dizziness Rare dizzy episode 5 min Once a week Spinning, takes meclizine as needed  Home BP numbers:140/70 Sometimes lower if he sits for longer and rechecks In the past does not wanted more medication  Compliant with his bystolic amlodipine and HCTZ  Tolerating statin Total cholesterol 140, LDL 70 Lab work reviewed with him  EKG personally reviewed by myself on todays visit Shows normal sinus rhythm with rate 70 bpm No significant ST-T wave changes, rare APCs  CT 09/3018 1. Stable endovascular repair of the thoracic aorta with unchanged appearance of debranching procedure.  2. Stable appearance of residual dissection with the false lumen opacifying near the level of the diaphragmatic hiatus (at the distal most aspect of the endovascular stent). Unchanged extension to the external iliac arteries, the SMA, and left renal artery. 3. Small arterial enhancing left hepatic lesion is indeterminate but may represent a  hemangioma. Attention on followup imaging. 4. Indeterminate exophytic posterior right renal lesion is stable from prior but increased in size from remote priors. Recommend dedicated renal imaging via MRI or CT RCC protocol.   Echo MILD LV DYSFUNCTION (See above) WITH MILD LVH, 50%  NORMAL LA PRESSURES WITH DIASTOLIC DYSFUNCTION  NORMAL RIGHT VENTRICULAR SYSTOLIC FUNCTION  VALVULAR REGURGITATION: MILD AR, TRIVIAL MR, TRIVIAL PR, TRIVIAL TR  NO VALVULAR STENOSIS  In hospital 08/21/2016 to 1/15 Had flu and PNA 08/2016 CT 08/26/2016 at [facility]  Other past medical history Prior ECHO showed EF 45-50%. Had cardiac CT in 12/10 after abnormal myvoiew. Coronaries normal except for 25% RCA. 72mm stable pulmonary nodule.  echo (02/2010) EF 55-60% with grade 1 diastolic dysfx and previous AO dissection flap.    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  PSH:    Past Surgical History:  Procedure Laterality Date  . ABDOMINAL AORTIC ANEURYSM REPAIR  march 2013  . 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 CARDIAC SURGERY  2013   at [facility]- transverse aortic arch/descending aneurysm repair s/p debranching procedure  (  total of 5 Gore C-TAG devices extending from distal descending aorta to his prev placed ascending dacron graft)  . COLONOSCOPY    . Flex- Polyp   06/1999    at 38cm, Int/Ext Hemm divertics [facility])  . Hemi Arch Replacement  03/2009 and repeat 10/2011   and AoV resuspension w/ Repl of Ascending Aorta and Hemiarch w/ Graft; Pericardial Tamponade Aortic Regurg 8/22-8/28/10;  endovascular repair of aortic dilation 2013 with  ascending aorta to L common carotid, R subclavian, R common carotid and L carotid subclavian bypass  . [facility]  08/22-8/28/2010   Thoracic aortic dissection pericardial teamponade aortic regurg  . INGUINAL HERNIA REPAIR Right 12/24/2017   Procedure: HERNIA REPAIR INGUINAL ADULT;  Surgeon: Greer Pickerel, MD;  Location: [facility];  Service: General;  Laterality: Right;  . INSERTION OF MESH Right 12/24/2017   Procedure: INSERTION OF MESH;  Surgeon: Greer Pickerel, MD;  Location: [facility];  Service: General;  Laterality: Right;  . melanoma removed from scalp  06/2006   [place]  . PENILE PROSTHESIS IMPLANT  1996  . POLYPECTOMY    . PROSTATECTOMY  12/1993   Dr. Risa Grill  . WISDOM TOOTH EXTRACTION  10/2005    Current Outpatient Medications  Medication Sig Dispense Refill  . albuterol (PROVENTIL HFA;VENTOLIN HFA) 108 (90 Base) MCG/ACT inhaler Inhale 2 puffs into the lungs every 6 (six) hours as needed for wheezing or shortness of breath. 1 Inhaler 0  . amLODipine (NORVASC) 5 MG tablet Take 5 mg by mouth daily.    Marland Kitchen amoxicillin-clavulanate (AUGMENTIN) 875-125 MG tablet Take 1 tablet by mouth 2 (two) times daily. (Patient taking differently: Take 1 tablet by mouth 2 (two) times daily. Dental procedures only.) 20 tablet 0  . atorvastatin (LIPITOR) 20 MG tablet TAKE 1 TABLET BY MOUTH AT BEDTIME 90 tablet 3  . famotidine (PEPCID) 20 MG tablet Take 1 tablet (20 mg total) by mouth daily as needed for heartburn or indigestion.    . fluticasone (FLONASE) 50 MCG/ACT nasal spray Use 1 spray in each nostril daily as needed for congestion 16 g 4  . fluticasone (FLOVENT HFA) 110 MCG/ACT inhaler INHALE 1 PUFF BY MOUTH TWICE DAILY RINSE MOUTH AFTER USE 12  g 5  . guaiFENesin (MUCINEX) 600 MG 12 hr tablet Take 600 mg by mouth 2 (two) times daily as needed for cough or to loosen phlegm.     . hydrochlorothiazide (HYDRODIURIL) 25 MG tablet Take 1 tablet (25 mg total) by mouth every other day. 90 tablet 3  . Lutein-Zeaxanthin 25-5 MG CAPS Take 1 capsule by mouth daily.    . meclizine (ANTIVERT) 25 MG tablet Take 1 tablet (25 mg total) by mouth 3 (three) times daily as needed for dizziness.    . Multiple Vitamin (MULITIVITAMIN WITH MINERALS) TABS Take 1 tablet by mouth daily.    . Nebivolol HCl (BYSTOLIC) 20 MG TABS Take 1 tablet (20 mg total) by mouth daily.    . promethazine (PHENERGAN) 25 MG tablet Take 1 tablet (25 mg total) by mouth every 6 (six) hours as needed for nausea or vomiting. 30 tablet 3   No current facility-administered medications for this visit.      Allergies:   Amlodipine and Lactose intolerance (gi)   Social History:  The patient  reports that he quit smoking about 24 years ago. His smoking use included cigarettes. He has a 25.00 pack-year smoking history. He has never used smokeless tobacco. He reports current alcohol use. He reports that he  does not use drugs.   Family History:   family history includes Colon polyps in his sister; Diabetes in some other family members; Heart disease in his father and mother; Heart failure in his mother; Hyperlipidemia in his daughter; Hypertension in his mother; Kidney disease in his father; Kidney failure in his father; Prostate cancer in his father; Stroke in his father.    Review of Systems: Review of Systems  Constitutional: Negative.   Cardiovascular: Negative.   Gastrointestinal: Negative.   Musculoskeletal: Negative.   Neurological: Negative.   Psychiatric/Behavioral: Negative.   All other systems reviewed and are negative.   PHYSICAL EXAM: VS:  BP (!) 140/98 (BP Location: Left Arm, Patient Position: Sitting, Cuff Size: Normal)   Pulse 70   Ht 6' (1.829 m)   Wt 168 lb 12 oz  (76.5 kg)   SpO2 98%   BMI 22.89 kg/m  , BMI Body mass index is 22.89 kg/m. Constitutional:  oriented to person, place, and time. No distress.  HENT:  Head: Grossly normal Eyes:  no discharge. No scleral icterus.  Neck: No JVD, + carotid bruits  Cardiovascular: Regular rate and rhythm, no murmurs appreciated Pulmonary/Chest: Clear to auscultation bilaterally, no wheezes or rails Abdominal: Soft.  no distension.  no tenderness.  Musculoskeletal: Normal range of motion Neurological:  normal muscle tone. Coordination normal. No atrophy Skin: Skin warm and dry Psychiatric: normal affect, pleasant  Recent Labs: 09/04/2018: ALT 22; BUN 19; Hemoglobin 16.3; Platelets 129.0; Potassium 3.8; Sodium 143; TSH 2.99 02/12/2019: Creatinine, Ser 0.90    Lipid Panel Lab Results  Component Value Date   CHOL 143 09/04/2018   HDL 38.30 (L) 09/04/2018   LDLCALC 71 09/04/2018   TRIG 167.0 (H) 09/04/2018      Wt Readings from Last 3 Encounters:  04/28/19 168 lb 12 oz (76.5 kg)  01/13/19 169 lb 14.4 oz (77.1 kg)  09/09/18 169 lb 4 oz (76.8 kg)      ASSESSMENT AND PLAN:  Essential hypertension - Plan: EKG 12-Lead Reviewed office visits, blood pressure borderline elevated Recommended he take extra amlodipine as needed for high pressures Could also increase dose of bystolic He prefers to regulate this himself at home In general does not like to take extra medication, concerned it will run too low  Coronary artery disease involving native coronary artery of native heart without angina pectoris  Cholesterol is at goal on the current lipid regimen.  Mild coronary calcifications seen on previous CT scan Not very active, but denies anginal symptoms Recent echocardiogram reviewed  Frequent PVCs Asymptomatic Continue beta blocker No changes to his management  Dissection of thoracic aorta ([place]), H/O ascending aorta repair [facility], scan every 24 months Recent scan reviewed  with him in detail   Total encounter time more than 25 minutes  Greater than 50% was spent in counseling and coordination of care with the patient   Disposition:   F/U  12 months   No orders of the defined types were placed in this encounter.    Signed, Esmond Plants, M.D., Ph.D. 04/28/2019  [place], [place]

## 2019-04-28 ENCOUNTER — Other Ambulatory Visit: Payer: Self-pay | Admitting: Cardiovascular Disease

## 2019-04-28 ENCOUNTER — Encounter: Payer: Self-pay | Admitting: Cardiovascular Disease

## 2019-04-28 ENCOUNTER — Ambulatory Visit (INDEPENDENT_AMBULATORY_CARE_PROVIDER_SITE_OTHER): Payer: Medicare Other | Admitting: Cardiovascular Disease

## 2019-04-28 ENCOUNTER — Other Ambulatory Visit: Payer: Self-pay

## 2019-04-28 VITALS — BP 140/98 | HR 70 | Ht 72.0 in | Wt 168.8 lb

## 2019-04-28 DIAGNOSIS — Z9889 Other specified postprocedural states: Secondary | ICD-10-CM | POA: Diagnosis not present

## 2019-04-28 DIAGNOSIS — I10 Essential (primary) hypertension: Secondary | ICD-10-CM | POA: Diagnosis not present

## 2019-04-28 DIAGNOSIS — Z23 Encounter for immunization: Secondary | ICD-10-CM | POA: Diagnosis not present

## 2019-04-28 DIAGNOSIS — I25118 Atherosclerotic heart disease of native coronary artery with other forms of angina pectoris: Secondary | ICD-10-CM | POA: Diagnosis not present

## 2019-04-28 DIAGNOSIS — I359 Nonrheumatic aortic valve disorder, unspecified: Secondary | ICD-10-CM

## 2019-04-28 DIAGNOSIS — E785 Hyperlipidemia, unspecified: Secondary | ICD-10-CM

## 2019-04-28 MED ORDER — BYSTOLIC 20 MG PO TABS: 20.0000 mg | ORAL_TABLET | Freq: Every day | ORAL | 11 refills | Status: DC

## 2019-04-28 NOTE — Patient Instructions (Signed)
Flu shot, potent cocktail   Medication Instructions:  No changes  If you need a refill on your cardiac medications before your next appointment, please call your pharmacy.    Lab work: No new labs needed   If you have labs (blood work) drawn today and your tests are completely normal, you will receive your results only by: Marland Kitchen MyChart Message (if you have MyChart) OR . A paper copy in the mail If you have any lab test that is abnormal or we need to change your treatment, we will call you to review the results.   Testing/Procedures: No new testing needed   Follow-Up: At [facility], you and your health needs are our priority.  As part of our continuing mission to provide you with exceptional heart care, we have created designated Provider Care Teams.  These Care Teams include your primary Cardiologist (physician) and Advanced Practice Providers (APPs -  Physician Assistants and Nurse Practitioners) who all work together to provide you with the care you need, when you need it.  . You will need a follow up appointment in 12 months .   Please call our office 2 months in advance to schedule this appointment.    . Providers on your designated Care Team:   . Murray Hodgkins, NP . Christell Faith, PA-C . Marrianne Mood, PA-C  Any Other Special Instructions Will Be Listed Below (If Applicable).  For educational health videos Log in to : [email or web address] Or : [email or web address], password : [email or web address]

## 2019-05-13 ENCOUNTER — Other Ambulatory Visit: Payer: Self-pay | Admitting: Cardiovascular Disease

## 2019-05-13 NOTE — Telephone Encounter (Signed)
Lmovm to verify Amlodipine 5 mg dose and how pt is taking medication.

## 2019-05-13 NOTE — Telephone Encounter (Signed)
Patient returning call.  Audibly irritated and expressing frustration as to why we would need to confirm this med .  Patient confirmed he is taking 5 mg po q d

## 2019-06-15 ENCOUNTER — Other Ambulatory Visit: Payer: Self-pay | Admitting: Cardiovascular Disease

## 2019-06-29 ENCOUNTER — Other Ambulatory Visit: Payer: Self-pay | Admitting: Family Medicine

## 2019-07-02 ENCOUNTER — Other Ambulatory Visit: Payer: Self-pay | Admitting: Cardiovascular Disease

## 2019-07-30 ENCOUNTER — Ambulatory Visit: Payer: Medicare Other | Attending: Internal Medicine

## 2019-07-30 ENCOUNTER — Other Ambulatory Visit: Payer: Self-pay

## 2019-07-30 DIAGNOSIS — Z20822 Contact with and (suspected) exposure to covid-19: Secondary | ICD-10-CM

## 2019-07-31 LAB — NOVEL CORONAVIRUS, NAA: SARS-CoV-2, NAA: NOT DETECTED

## 2019-08-30 ENCOUNTER — Other Ambulatory Visit: Payer: Self-pay | Admitting: Family Medicine

## 2019-08-30 DIAGNOSIS — D696 Thrombocytopenia, unspecified: Secondary | ICD-10-CM

## 2019-08-30 DIAGNOSIS — Z125 Encounter for screening for malignant neoplasm of prostate: Secondary | ICD-10-CM

## 2019-08-30 DIAGNOSIS — I10 Essential (primary) hypertension: Secondary | ICD-10-CM

## 2019-09-03 ENCOUNTER — Telehealth: Payer: Self-pay

## 2019-09-03 NOTE — Telephone Encounter (Signed)
LVM w COVID screen, front door and back lab info 1.21.2021 TLJ

## 2019-09-07 ENCOUNTER — Other Ambulatory Visit: Payer: Self-pay

## 2019-09-07 ENCOUNTER — Other Ambulatory Visit (INDEPENDENT_AMBULATORY_CARE_PROVIDER_SITE_OTHER): Payer: Medicare Other

## 2019-09-07 DIAGNOSIS — Z125 Encounter for screening for malignant neoplasm of prostate: Secondary | ICD-10-CM

## 2019-09-07 DIAGNOSIS — I10 Essential (primary) hypertension: Secondary | ICD-10-CM

## 2019-09-07 LAB — CBC WITH DIFFERENTIAL/PLATELET
Basophils Absolute: 0 10*3/uL (ref 0.0–0.1)
Basophils Relative: 0.4 % (ref 0.0–3.0)
Eosinophils Absolute: 0.5 10*3/uL (ref 0.0–0.7)
Eosinophils Relative: 5.6 % — ABNORMAL HIGH (ref 0.0–5.0)
HCT: 48.6 % (ref 39.0–52.0)
Hemoglobin: 15.9 g/dL (ref 13.0–17.0)
Lymphocytes Relative: 34.1 % (ref 12.0–46.0)
Lymphs Abs: 3.3 10*3/uL (ref 0.7–4.0)
MCHC: 32.7 g/dL (ref 30.0–36.0)
MCV: 90.9 fl (ref 78.0–100.0)
Monocytes Absolute: 1.2 10*3/uL — ABNORMAL HIGH (ref 0.1–1.0)
Monocytes Relative: 12.8 % — ABNORMAL HIGH (ref 3.0–12.0)
Neutro Abs: 4.5 10*3/uL (ref 1.4–7.7)
Neutrophils Relative %: 47.1 % (ref 43.0–77.0)
Platelets: 142 10*3/uL — ABNORMAL LOW (ref 150.0–400.0)
RBC: 5.35 Mil/uL (ref 4.22–5.81)
RDW: 13.9 % (ref 11.5–15.5)
WBC: 9.6 10*3/uL (ref 4.0–10.5)

## 2019-09-07 LAB — COMPREHENSIVE METABOLIC PANEL
ALT: 19 U/L (ref 0–53)
AST: 20 U/L (ref 0–37)
Albumin: 4.7 g/dL (ref 3.5–5.2)
Alkaline Phosphatase: 67 U/L (ref 39–117)
BUN: 21 mg/dL (ref 6–23)
CO2: 31 mEq/L (ref 19–32)
Calcium: 9.5 mg/dL (ref 8.4–10.5)
Chloride: 102 mEq/L (ref 96–112)
Creatinine, Ser: 0.95 mg/dL (ref 0.40–1.50)
GFR: 76.8 mL/min (ref >60.00)
Glucose, Bld: 103 mg/dL — ABNORMAL HIGH (ref 70–99)
Potassium: 3.7 mEq/L (ref 3.5–5.1)
Sodium: 141 mEq/L (ref 135–145)
Total Bilirubin: 0.8 mg/dL (ref 0.2–1.2)
Total Protein: 8 g/dL (ref 6.0–8.3)

## 2019-09-07 LAB — LIPID PANEL
Cholesterol: 145 mg/dL (ref 0–200)
HDL: 38.2 mg/dL — ABNORMAL LOW (ref >39.00)
LDL Cholesterol: 75 mg/dL (ref 0–99)
NonHDL: 106.99
Total CHOL/HDL Ratio: 4
Triglycerides: 162 mg/dL — ABNORMAL HIGH (ref 0.0–149.0)
VLDL: 32.4 mg/dL (ref 0.0–40.0)

## 2019-09-07 LAB — PSA, MEDICARE: PSA: 0 ng/ml — ABNORMAL LOW (ref 0.10–4.00)

## 2019-09-09 ENCOUNTER — Other Ambulatory Visit: Payer: Self-pay

## 2019-09-09 ENCOUNTER — Ambulatory Visit: Payer: Medicare Other

## 2019-09-10 ENCOUNTER — Ambulatory Visit: Payer: Medicare Other

## 2019-09-10 ENCOUNTER — Other Ambulatory Visit: Payer: Self-pay

## 2019-09-10 ENCOUNTER — Encounter: Payer: Self-pay | Admitting: Family Medicine

## 2019-09-10 ENCOUNTER — Ambulatory Visit (INDEPENDENT_AMBULATORY_CARE_PROVIDER_SITE_OTHER): Payer: Medicare Other | Admitting: Family Medicine

## 2019-09-10 VITALS — BP 118/82 | HR 84 | Temp 97.5°F | Ht 72.0 in | Wt 173.2 lb

## 2019-09-10 DIAGNOSIS — Z9889 Other specified postprocedural states: Secondary | ICD-10-CM

## 2019-09-10 DIAGNOSIS — H8110 Benign paroxysmal vertigo, unspecified ear: Secondary | ICD-10-CM | POA: Diagnosis not present

## 2019-09-10 DIAGNOSIS — Z0000 Encounter for general adult medical examination without abnormal findings: Secondary | ICD-10-CM

## 2019-09-10 DIAGNOSIS — Z7189 Other specified counseling: Secondary | ICD-10-CM

## 2019-09-10 DIAGNOSIS — D696 Thrombocytopenia, unspecified: Secondary | ICD-10-CM

## 2019-09-10 DIAGNOSIS — E785 Hyperlipidemia, unspecified: Secondary | ICD-10-CM | POA: Diagnosis not present

## 2019-09-10 DIAGNOSIS — I10 Essential (primary) hypertension: Secondary | ICD-10-CM | POA: Diagnosis not present

## 2019-09-10 MED ORDER — AMLODIPINE BESYLATE 5 MG PO TABS: ORAL_TABLET | ORAL | Status: DC

## 2019-09-10 MED ORDER — AMOXICILLIN 500 MG PO CAPS: 2000.0000 mg | ORAL_CAPSULE | Freq: Once | ORAL | Status: AC

## 2019-09-10 NOTE — Progress Notes (Signed)
This visit occurred during the SARS-CoV-2 public health emergency.  Safety protocols were in place, including screening questions prior to the visit, additional usage of staff PPE, and extensive cleaning of exam room while observing appropriate contact time as indicated for disinfecting solutions.  I have personally reviewed the Medicare Annual Wellness questionnaire and have noted 1. The patient's medical and social history 2. Their use of alcohol, tobacco or illicit drugs 3. Their current medications and supplements 4. The patient's functional ability including ADL's, fall risks, home safety risks and hearing or visual             impairment. 5. Diet and physical activities 6. Evidence for depression or mood disorders  The patients weight, height, BMI have been recorded in the chart and visual acuity is per eye clinic.  I have made referrals, counseling and provided education to the patient based review of the above and I have provided the pt with a written personalized care plan for preventive services.  Provider list updated- see scanned forms.  Routine anticipatory guidance given to patient.  See health maintenance. The possibility exists that previously documented standard health maintenance information may have been brought forward from a previous encounter into this note.  If needed, that same information has been updated to reflect the current situation based on today's encounter.    Shingles up to date.   Flu 2020 PNA up to date.  Tetanus up to date.  covid vaccine d/w pt.    Colonoscopy 2019 PSA still zero. d/w pt.  Advance directive- wife designated if patient were incapacitated. Cognitive function addressed- see scanned forms- and if abnormal then additional documentation follows.   Hypertension:  Using medication without problems or lightheadedness: yes Chest pain with exertion:no Edema:rare, much better on lower dose of amlodipine.   Short of breath:no Labs d/w pt.    Elevated Cholesterol: Using medications without problems:yes Muscle aches: no Diet compliance: yes Exercise:yes Labs d/w pt.   History of thrombocytopenia. Stable, no bleeding.  Discussed with patient.  Occ BPV with prev ENT eval, d/w pt about home exercise prn.  He will update me as needed.  He is not lightheaded.  He is going to f/u with [facility] this spring.  He takes amoxil prior to dental appointments.   He has urology f/u pending later this summer.  Per urology, CT is consistent with a benign complex cyst  PMH and SH reviewed  Meds, vitals, and allergies reviewed.   ROS: Per HPI.  Unless specifically indicated otherwise in HPI, the patient denies:  General: fever. Eyes: acute vision changes ENT: sore throat Cardiovascular: chest pain Respiratory: SOB GI: vomiting GU: dysuria Musculoskeletal: acute back pain Derm: acute rash Neuro: acute motor dysfunction Psych: worsening mood Endocrine: polydipsia Heme: bleeding Allergy: hayfever  GEN: nad, alert and oriented HEENT: ncat NECK: supple w/o LA CV: rrr. PULM: ctab, no inc wob ABD: soft, +bs EXT: no edema SKIN: no acute rash  Health Maintenance  Topic Date Due  . TETANUS/TDAP  09/17/2028  . INFLUENZA VACCINE  Completed  . PNA vac Low Risk Adult  Completed

## 2019-09-10 NOTE — Patient Instructions (Signed)
Don't change your meds for now.  Update me as needed.  Take care.  Glad to see you.   

## 2019-09-14 NOTE — Assessment & Plan Note (Signed)
Able to tolerate statin.  Continue as is.  Labs discussed with patient.  He agrees.

## 2019-09-14 NOTE — Assessment & Plan Note (Signed)
Advance directive- wife designated if patient were incapacitated.  

## 2019-09-14 NOTE — Assessment & Plan Note (Signed)
Noted, stable, no bleeding.  Discussed with patient.

## 2019-09-14 NOTE — Assessment & Plan Note (Signed)
No chest pain.  He will follow up with [facility].  I will defer.  He agrees.

## 2019-09-14 NOTE — Assessment & Plan Note (Signed)
Not lightheaded.  History of BPV.  He can use home bedside exercises as needed.  He will update me as needed.  Discussed.

## 2019-09-14 NOTE — Assessment & Plan Note (Signed)
No change in meds.  He will update me as needed.  Labs discussed with patient.

## 2019-09-14 NOTE — Assessment & Plan Note (Signed)
Shingles up to date.   Flu 2020 PNA up to date.  Tetanus up to date.  covid vaccine d/w pt.    Colonoscopy 2019 PSA still zero. d/w pt.  Advance directive- wife designated if patient were incapacitated. Cognitive function addressed- see scanned forms- and if abnormal then additional documentation follows.

## 2019-10-05 ENCOUNTER — Ambulatory Visit: Payer: Medicare Other | Attending: Internal Medicine

## 2019-10-05 DIAGNOSIS — Z23 Encounter for immunization: Secondary | ICD-10-CM | POA: Insufficient documentation

## 2019-10-05 NOTE — Progress Notes (Signed)
   Covid-19 Vaccination Clinic  Name:  Ray Fowler    MRN: [id number] DOB: 11-05-1940  10/05/2019  [name] was observed post Covid-19 immunization for 15 minutes without incidence. He was provided with Vaccine Information Sheet and instruction to access the V-Safe system.   [name] was instructed to call 911 with any severe reactions post vaccine: Marland Kitchen Difficulty breathing  . Swelling of your face and throat  . A fast heartbeat  . A bad rash all over your body  . Dizziness and weakness    Immunizations Administered    Name Date Dose VIS Date Route   Pfizer COVID-19 Vaccine 10/05/2019 11:56 AM 0.3 mL 07/24/2019 Intramuscular   Manufacturer: [place]   Lot: [id number]   [place]: [id number]

## 2019-10-28 ENCOUNTER — Ambulatory Visit: Payer: Medicare Other | Attending: Internal Medicine

## 2019-10-28 DIAGNOSIS — Z23 Encounter for immunization: Secondary | ICD-10-CM

## 2019-10-28 NOTE — Progress Notes (Signed)
   Covid-19 Vaccination Clinic  Name:  Ray Fowler    MRN: [id number] DOB: 11-11-1940  10/28/2019  Ray Fowler was observed post Covid-19 immunization for 15 minutes without incident. He was provided with Vaccine Information Sheet and instruction to access the V-Safe system.   Ray Fowler was instructed to call 911 with any severe reactions post vaccine: Marland Kitchen Difficulty breathing  . Swelling of face and throat  . A fast heartbeat  . A bad rash all over body  . Dizziness and weakness   Immunizations Administered    Name Date Dose VIS Date Route   Pfizer COVID-19 Vaccine 10/28/2019 11:31 AM 0.3 mL 07/24/2019 Intramuscular   Manufacturer: [place]   Lot: [id number]   [place]: [id number]

## 2020-02-12 ENCOUNTER — Ambulatory Visit: Payer: Medicare Other | Admitting: Urology

## 2020-02-20 NOTE — Progress Notes (Signed)
02/22/2020 11:25 AM   Ray Fowler [birth year] [id number]  Referring provider: Tonia Ghent, MD [address] [place],  [place] [address] Chief Complaint  Patient presents with  . Other    HPI: Ray Fowler is a 79 y.o. male 79 year old male with a history of prostate cancer status post open radical retropubic prostatectomy 1995 and subsequent penile implant placed 1996 for postop erectile dysfunction. He present today for a 1 year follow up.   -He was referred for abnormal renal lesion identified on CTA chest abdomen pelvis at [facility] which was performed in February 2020. 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 -His CT images were not available for review however in reviewing the radiology report his CT in 2020 showed large right renal cysts.   -There was an area in the right posterior kidney which was felt to be indeterminate.  It had >10 HU arterial enhancement. On a scan of 2018 the size was measured at 2.1 cm and was felt unchanged from 2016.  It measured 1.4 cm and 2014 and 2015.   -Renal mass protocol CT performed 02/2019 showed the indeterminate lesion to be a Bosniak 2 cyst; was incidentally noted to have moderate right hydronephrosis felt either from extrinsic compression from large renal cyst versus UPJ obstruction  - He has nocturia x 1-2 nightly -Denies sleep apnea -Patient does not want to take anymore medication   PMH: Past Medical History:  Diagnosis Date  . Allergic rhinitis   . Allergy   . 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 Arthritis   . BCC (basal cell carcinoma of skin) 2014  . Blood transfusion without reported diagnosis    he thinks 2010 with aortix dissection surgery  . Carotid bruit    Right. Carotid u/s 2/11 normal.  . Cataract 2020   bilateral eyes  . CHF (congestive heart failure) ([place])   . Complication of anesthesia   . Diverticulosis   . Family history  of adverse reaction to anesthesia   . History of kidney stones    1980  . HTN (hypertension)   . Hyperlipidemia   . Irregular heart beat    " extra beat " per pt   . 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 Pneumonia    2018  . PONV (postoperative nausea and vomiting)   . Prostate cancer ([place])    1995  . Pulmonary nodule    Stable on CT 2/11  . S/P CABG x 1    for aneuryms repair  . Skin cancer    scalp 2007  melanoma    Surgical History: Past Surgical History:  Procedure Laterality Date  . ABDOMINAL AORTIC ANEURYSM REPAIR  march 2013  . 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 CARDIAC SURGERY  2013   at [facility]- transverse aortic arch/descending aneurysm repair s/p debranching procedure  (total of 5 Gore C-TAG devices extending from distal descending aorta to his prev placed ascending dacron graft)  . COLONOSCOPY    . Flex- Polyp   06/1999   at 38cm, Int/Ext Hemm divertics [place] Plan)  . Hemi Arch Replacement  03/2009 and repeat 10/2011   and AoV resuspension w/ Repl of Ascending Aorta and Hemiarch w/ Graft; Pericardial Tamponade Aortic Regurg 8/22-8/28/10;  endovascular repair of aortic dilation 2013 with  ascending aorta to L common carotid, R subclavian,  R common carotid and L carotid subclavian bypass  . [facility]  08/22-8/28/2010   Thoracic aortic dissection pericardial teamponade aortic regurg  . INGUINAL HERNIA REPAIR Right 12/24/2017   Procedure: HERNIA REPAIR INGUINAL ADULT;  Surgeon: Greer Pickerel, MD;  Location: [facility];  Service: General;  Laterality: Right;  . INSERTION OF MESH Right 12/24/2017   Procedure: INSERTION OF MESH;  Surgeon: Greer Pickerel, MD;  Location: [facility];  Service: General;  Laterality: Right;  . melanoma removed from scalp  06/2006   [place]  . PENILE PROSTHESIS IMPLANT  1996  . POLYPECTOMY    . PROSTATECTOMY  12/1993   Dr. Risa Grill  . WISDOM TOOTH EXTRACTION  10/2005    Home Medications:    Allergies as of 02/22/2020      Reactions   Amlodipine Other (See Comments)   Edema at 10mg  a day   Lactose Intolerance (gi) Diarrhea, Other (See Comments)   Bloating, gas      Medication List       Accurate as of February 22, 2020 11:25 AM. If you have any questions, ask your nurse or doctor.        albuterol 108 (90 Base) MCG/ACT inhaler Commonly known as: VENTOLIN HFA Inhale 2 puffs into the lungs every 6 (six) hours as needed for wheezing or shortness of breath.   amLODipine 5 MG tablet Commonly known as: NORVASC TAKE 1 TABLET BY MOUTH DAILY   atorvastatin 20 MG tablet Commonly known as: LIPITOR TAKE 1 TABLET BY MOUTH EVERY NIGHT AT BEDTIME   Bystolic 20 MG Tabs Generic drug: Nebivolol HCl Take 1 tablet (20 mg total) by mouth daily.   famotidine 20 MG tablet Commonly known as: Pepcid Take 1 tablet (20 mg total) by mouth daily as needed for heartburn or indigestion.   fluticasone 110 MCG/ACT inhaler Commonly known as: Flovent HFA INHALE 1 PUFF BY MOUTH TWICE DAILY RINSE MOUTH AFTER USE   fluticasone 50 MCG/ACT nasal spray Commonly known as: FLONASE USE 1 SPRAY IN EACH NOSTRIL ONCE DAILY AS NEEDED FOR CONGESTION   guaiFENesin 600 MG 12 hr tablet Commonly known as: MUCINEX Take 600 mg by mouth 2 (two) times daily as needed for cough or to loosen phlegm.   hydrochlorothiazide 25 MG tablet Commonly known as: HYDRODIURIL TAKE 1 TABLET BY MOUTH EVERY OTHER DAY   Lutein-Zeaxanthin 25-5 MG Caps Take 1 capsule by mouth daily.   meclizine 25 MG tablet Commonly known as: ANTIVERT Take 1 tablet (25 mg total) by mouth 3 (three) times daily as needed for dizziness.   promethazine 25 MG tablet Commonly known as: PHENERGAN Take 1 tablet (25 mg total) by mouth every 6 (six) hours as needed for nausea or vomiting.       Allergies:  Allergies  Allergen Reactions  . Amlodipine Other (See Comments)    Edema at 10mg  a day  . Lactose Intolerance (Gi) Diarrhea and Other  (See Comments)    Bloating, gas    Family History: Family History  Problem Relation Age of Onset  . Heart failure Mother        died age 36  . Hypertension Mother   . Heart disease Mother        CHF  . Kidney failure Father   . Heart disease Father        coronary disease, CABG  . Stroke Father        due to "infection" from [place]  . Kidney disease Father  chronic  . Prostate cancer Father   . Diabetes Other   . Colon polyps Sister   . Diabetes Other   . Hyperlipidemia Daughter   . Colon cancer Neg Hx   . Esophageal cancer Neg Hx   . Stomach cancer Neg Hx   . Rectal cancer Neg Hx     Social History:  reports that he quit smoking about 25 years ago. His smoking use included cigarettes. He has a 25.00 pack-year smoking history. He has never used smokeless tobacco. He reports current alcohol use. He reports that he does not use drugs.   Physical Exam: BP 129/70   Pulse 79   Ht 6' (1.829 m)   Wt 170 lb (77.1 kg)   BMI 23.06 kg/m   Constitutional:  Alert and oriented, No acute distress. HEENT: [place] AT, moist mucus membranes.  Trachea midline, no masses. Cardiovascular: No clubbing, cyanosis, or edema. Respiratory: Normal respiratory effort, no increased work of breathing. Skin: No rashes, bruises or suspicious lesions. Neurologic: Grossly intact, no focal deficits, moving all 4 extremities. Psychiatric: Normal mood and affect.  Laboratory Data:  Lab Results  Component Value Date   CREATININE 0.95 09/07/2019    Lab Results  Component Value Date   PSA 0.00 (L) 09/07/2019   PSA 0.00 (L) 09/04/2018   PSA 0.00 (L) 08/21/2017     Assessment & Plan:    1.  Bosniak 2 right renal cyst -No further imaging needed   2.  Right hydronephrosis  -Diuretic renal scan ordered. Will call with results.    [facility] [address], [place],  [address] [phone]  I, Selena Batten, am acting as a scribe for Dr.  Nicki Reaper C. Edelmira Gallogly,  I have reviewed the above documentation for accuracy and completeness, and I agree with the above.   Abbie Sons, MD

## 2020-02-22 ENCOUNTER — Encounter: Payer: Self-pay | Admitting: Urology

## 2020-02-22 ENCOUNTER — Other Ambulatory Visit: Payer: Self-pay

## 2020-02-22 ENCOUNTER — Ambulatory Visit: Payer: Medicare Other | Admitting: Urology

## 2020-02-22 VITALS — BP 129/70 | HR 79 | Ht 72.0 in | Wt 170.0 lb

## 2020-02-22 DIAGNOSIS — N281 Cyst of kidney, acquired: Secondary | ICD-10-CM | POA: Diagnosis not present

## 2020-02-22 DIAGNOSIS — N1330 Unspecified hydronephrosis: Secondary | ICD-10-CM | POA: Diagnosis not present

## 2020-02-29 ENCOUNTER — Other Ambulatory Visit: Payer: Self-pay | Admitting: Cardiovascular Disease

## 2020-03-04 ENCOUNTER — Telehealth: Payer: Self-pay

## 2020-03-04 NOTE — Telephone Encounter (Signed)
Patient left VM regarding some questions ab his diuretic renal scan. Tried to return patients call, LMOM.

## 2020-03-07 NOTE — Telephone Encounter (Signed)
Patient called to cancel his renal scan. Did not want to reschedule appointment.

## 2020-03-16 ENCOUNTER — Other Ambulatory Visit: Payer: Self-pay | Admitting: Cardiovascular Disease

## 2020-03-24 ENCOUNTER — Other Ambulatory Visit: Payer: Self-pay | Admitting: Cardiovascular Disease

## 2020-04-25 ENCOUNTER — Other Ambulatory Visit: Payer: Self-pay | Admitting: Family Medicine

## 2020-05-02 NOTE — Progress Notes (Signed)
Cardiology Office Note  Date:  05/03/2020   ID:  Ray, Fowler [birth year], MRN [id number]  PCP:  Ray Ghent, MD   Chief Complaint  Patient presents with   other    12 month follow up. Meds reviewed by the pt. verbally. "doing well."     HPI:  Ray Fowler is a 79 y/o male with h/o HTN,  hyperlipidemia,  Type A aortic dissection in 0/34 complicated by cardiac tamponade,  CT showed extension of the dissection from the aortic root all the way down to the femoral arteries,  s/p aortic root replacement of hemiarch repair and aortic valve resuspension.  Post-op course complicated by persistent HTN and atrial flutter treated with amio (stopped in January 2011).  He has followup with cardiothoracic surgery at [facility] on an annual basis with CT scan surveillance. CT September 2014 showing remodeling of his thoracic aorta, overall looked good/stable. He presents for routine followup of his of his hypertension And aortic dissection  Last CT scan February 2020 at [facility] There was a CT scan abdomen July 2020 through urology  On today's visit reports he is doing well in general Wife has been ill, 3 bladder stones, obstructed ureter, infection  Reports blood pressure typically runs around 742 systolic, happy with that He does not feel he needs additional medications bystolic expensive, $59 a month Prefers not to change at this time  Comfortable with amlodipine 5 daily, higher doses reports he gets leg swelling HCTZ every other day  Denies any chest pain concerning for angina  Previously reported having chronic dizziness Take meclizine as needed  Lab Results  Component Value Date   CHOL 145 09/07/2019   HDL 38.20 (L) 09/07/2019   LDLCALC 75 09/07/2019   TRIG 162.0 (H) 09/07/2019    EKG personally reviewed by myself on todays visit Shows normal sinus rhythm with rate 71 bpm No significant ST-T wave changes CT 09/3018 1. Stable endovascular repair of the  thoracic aorta with unchanged appearance of debranching procedure.  2. Stable appearance of residual dissection with the false lumen opacifying near the level of the diaphragmatic hiatus (at the distal most aspect of the endovascular stent). Unchanged extension to the external iliac arteries, the SMA, and left renal artery. 3. Small arterial enhancing left hepatic lesion is indeterminate but may represent a hemangioma. Attention on followup imaging. 4. Indeterminate exophytic posterior right renal lesion is stable from prior but increased in size from remote priors. Recommend dedicated renal imaging via MRI or CT RCC protocol.   Echo MILD LV DYSFUNCTION (See above) WITH MILD LVH, 50%  NORMAL LA PRESSURES WITH DIASTOLIC DYSFUNCTION  NORMAL RIGHT VENTRICULAR SYSTOLIC FUNCTION  VALVULAR REGURGITATION: MILD AR, TRIVIAL MR, TRIVIAL PR, TRIVIAL TR  NO VALVULAR STENOSIS  In hospital 08/21/2016 to 1/15 Had flu and PNA 08/2016 CT 08/26/2016 at [facility]  Other past medical history Prior ECHO showed EF 45-50%. Had cardiac CT in 12/10 after abnormal myvoiew. Coronaries normal except for 25% RCA. 84mm stable pulmonary nodule.  echo (02/2010) EF 55-60% with grade 1 diastolic dysfx and previous AO dissection flap.    PMH:   has a past medical history of Allergic rhinitis, Allergy, Aortic arch aneurysm ([place]) (10/2011), Aortic dissection ([place]) (2010), Arthritis, BCC (basal cell carcinoma of skin) (2014), Blood transfusion without reported diagnosis, Carotid bruit, Cataract (2020), CHF (congestive heart failure) ([place]), Complication of anesthesia, Diverticulosis, Family history of adverse reaction to anesthesia, History of kidney stones, HTN (hypertension), Hyperlipidemia, Irregular heart beat, Macular  degeneration of left eye (2020), Pneumonia, PONV (postoperative nausea and vomiting), Prostate cancer ([place]), Pulmonary nodule, S/P CABG x 1, and Skin cancer.  PSH:    Past Surgical History:  Procedure  Laterality Date   ABDOMINAL AORTIC ANEURYSM REPAIR  march 2013   Axillary Mass removal  05/2000   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[place])   CARDIAC SURGERY  2013   at [facility]- transverse aortic arch/descending aneurysm repair s/p debranching procedure  (total of 5 Gore C-TAG devices extending from distal descending aorta to his prev placed ascending dacron graft)   COLONOSCOPY     Flex- Polyp   06/1999   at 38cm, Int/Ext Hemm divertics [facility] Plan)   Hemi Arch Replacement  03/2009 and repeat 10/2011   and AoV resuspension w/ Repl of Ascending Aorta and Hemiarch w/ Graft; Pericardial Tamponade Aortic Regurg 8/22-8/28/10;  endovascular repair of aortic dilation 2013 with  ascending aorta to L common carotid, R subclavian, R common carotid and L carotid subclavian bypass   [facility]  08/22-8/28/2010   Thoracic aortic dissection pericardial teamponade aortic regurg   INGUINAL HERNIA REPAIR Right 12/24/2017   Procedure: HERNIA REPAIR INGUINAL ADULT;  Surgeon: Ray Pickerel, MD;  [facility]: [facility];  Service: General;  Laterality: Right;   INSERTION OF MESH Right 12/24/2017   Procedure: INSERTION OF MESH;  Surgeon: Ray Pickerel, MD;  Location: [facility];  Service: General;  Laterality: Right;   melanoma removed from scalp  06/2006   [place]   POLYPECTOMY     PROSTATECTOMY  12/1993   Dr. Risa Fowler   WISDOM TOOTH EXTRACTION  10/2005    Current Outpatient Medications  Medication Sig Dispense Refill   albuterol (PROVENTIL HFA;VENTOLIN HFA) 108 (90 Base) MCG/ACT inhaler Inhale 2 puffs into the lungs every 6 (six) hours as needed for wheezing or shortness of breath. 1 Inhaler 0   amLODipine (NORVASC) 5 MG tablet TAKE 1 TABLET BY MOUTH ONCE DAILY 90 tablet 0   atorvastatin (LIPITOR) 20 MG tablet TAKE 1 TABLET BY MOUTH DAILY AT BEDTIME 90 tablet 0   BYSTOLIC 20 MG TABS TAKE 1 TABLET BY MOUTH ONCE A DAY 30 tablet 1   fluticasone (FLONASE) 50 MCG/ACT  nasal spray USE 1 SPRAY IN EACH NOSTRIL ONCE DAILY AS NEEDED FOR CONGESTION 16 g 4   fluticasone (FLOVENT HFA) 110 MCG/ACT inhaler INHALE 1 PUFF INTO THE LUNGS TWICE DAILY*RINSE MOUTH AFTER USE* 12 g 5   guaiFENesin (MUCINEX) 600 MG 12 hr tablet Take 600 mg by mouth 2 (two) times daily as needed for cough or to loosen phlegm.      hydrochlorothiazide (HYDRODIURIL) 25 MG tablet TAKE 1 TABLET BY MOUTH EVERY OTHER DAY 90 tablet 2   Lutein-Zeaxanthin 25-5 MG CAPS Take 1 capsule by mouth daily.     meclizine (ANTIVERT) 25 MG tablet Take 1 tablet (25 mg total) by mouth 3 (three) times daily as needed for dizziness.     promethazine (PHENERGAN) 25 MG tablet Take 1 tablet (25 mg total) by mouth every 6 (six) hours as needed for nausea or vomiting. 30 tablet 3   No current facility-administered medications for this visit.     Allergies:   Amlodipine and Lactose intolerance (gi)   Social History:  The patient  reports that he quit smoking about 25 years ago. His smoking use included cigarettes. He has a 25.00 pack-year smoking history. He has never used smokeless tobacco. He reports current alcohol use. He reports  that he does not use drugs.   Family History:   family history includes Colon polyps in his sister; Diabetes in some other family members; Heart disease in his father and mother; Heart failure in his mother; Hyperlipidemia in his daughter; Hypertension in his mother; Kidney disease in his father; Kidney failure in his father; Prostate cancer in his father; Stroke in his father.    Review of Systems: Review of Systems  Constitutional: Negative.   HENT: Negative.   Cardiovascular: Negative.   Gastrointestinal: Negative.   Musculoskeletal: Negative.   Neurological: Negative.   Psychiatric/Behavioral: Negative.   All other systems reviewed and are negative.   PHYSICAL EXAM: VS:  BP (!) 160/70 (BP Location: Left Arm, Patient Position: Sitting, Cuff Size: Normal)    Pulse 71    Ht 5'  10.5" (1.791 m)    Wt 172 lb 6 oz (78.2 kg)    BMI 24.38 kg/m  , BMI Body mass index is 24.38 kg/m. Constitutional:  oriented to person, place, and time. No distress.  HENT:  Head: Grossly normal Eyes:  no discharge. No scleral icterus.  Neck: No JVD, no carotid bruits  Cardiovascular: Regular rate and rhythm, no murmurs appreciated Pulmonary/Chest: Clear to auscultation bilaterally, no wheezes or rails Abdominal: Soft.  no distension.  no tenderness.  Musculoskeletal: Normal range of motion Neurological:  normal muscle tone. Coordination normal. No atrophy Skin: Skin warm and dry Psychiatric: normal affect, pleasant   Recent Labs: 09/07/2019: ALT 19; BUN 21; Creatinine, Ser 0.95; Hemoglobin 15.9; Platelets 142.0; Potassium 3.7; Sodium 141    Lipid Panel Lab Results  Component Value Date   CHOL 145 09/07/2019   HDL 38.20 (L) 09/07/2019   LDLCALC 75 09/07/2019   TRIG 162.0 (H) 09/07/2019      Wt Readings from Last 3 Encounters:  05/03/20 172 lb 6 oz (78.2 kg)  02/22/20 170 lb (77.1 kg)  09/10/19 173 lb 3 oz (78.6 kg)      ASSESSMENT AND PLAN:  Essential hypertension - Plan: EKG 12-Lead Discussed various treatment options for his blood pressure, he does not want to make any changes at this time Bystolic expensive, suggested we could try metoprolol succinate but would need additional blood pressure agent.  Prefers to live alone at this time Does not want HCTZ daily and prefers not to increase the amlodipine given concern for leg edema on high-dose  Coronary artery disease involving native coronary artery of native heart without angina pectoris Cholesterol at goal Denies angina Mild coronary calcification on CT scan No further work-up at this time  Frequent PVCs Asymptomatic Symptoms better on beta-blocker  Dissection of thoracic aorta [facility]), H/O ascending aorta repair Followed at [facility], scan every 24 months He reports repeat scans scheduled March 2022    Total encounter time more than 25 minutes  Greater than 50% was spent in counseling and coordination of care with the patient      No orders of the defined types were placed in this encounter.    Signed, Esmond Plants, M.D., Ph.D. 05/03/2020  [place], [place]

## 2020-05-03 ENCOUNTER — Encounter: Payer: Self-pay | Admitting: Cardiovascular Disease

## 2020-05-03 ENCOUNTER — Other Ambulatory Visit: Payer: Self-pay

## 2020-05-03 ENCOUNTER — Ambulatory Visit: Payer: Medicare Other | Admitting: Cardiovascular Disease

## 2020-05-03 VITALS — BP 145/70 | HR 71 | Ht 70.5 in | Wt 172.4 lb

## 2020-05-03 DIAGNOSIS — I4892 Unspecified atrial flutter: Secondary | ICD-10-CM

## 2020-05-03 DIAGNOSIS — E785 Hyperlipidemia, unspecified: Secondary | ICD-10-CM

## 2020-05-03 DIAGNOSIS — I25118 Atherosclerotic heart disease of native coronary artery with other forms of angina pectoris: Secondary | ICD-10-CM

## 2020-05-03 DIAGNOSIS — I359 Nonrheumatic aortic valve disorder, unspecified: Secondary | ICD-10-CM | POA: Diagnosis not present

## 2020-05-03 DIAGNOSIS — Z9889 Other specified postprocedural states: Secondary | ICD-10-CM | POA: Diagnosis not present

## 2020-05-03 DIAGNOSIS — I10 Essential (primary) hypertension: Secondary | ICD-10-CM | POA: Diagnosis not present

## 2020-05-03 MED ORDER — ATORVASTATIN CALCIUM 20 MG PO TABS: 20.0000 mg | ORAL_TABLET | Freq: Every day | ORAL | 3 refills | Status: DC

## 2020-05-03 MED ORDER — HYDROCHLOROTHIAZIDE 25 MG PO TABS: 25.0000 mg | ORAL_TABLET | ORAL | 3 refills | Status: DC

## 2020-05-03 MED ORDER — BYSTOLIC 20 MG PO TABS: 1.0000 | ORAL_TABLET | Freq: Every day | ORAL | 3 refills | Status: DC

## 2020-05-03 MED ORDER — AMLODIPINE BESYLATE 5 MG PO TABS: ORAL_TABLET | ORAL | 3 refills | Status: DC

## 2020-05-03 NOTE — Patient Instructions (Signed)
Medication Instructions:  No changes  If you need a refill on your cardiac medications before your next appointment, please call your pharmacy.    Lab work: No new labs needed   If you have labs (blood work) drawn today and your tests are completely normal, you will receive your results only by: . MyChart Message (if you have MyChart) OR . A paper copy in the mail If you have any lab test that is abnormal or we need to change your treatment, we will call you to review the results.   Testing/Procedures: No new testing needed   Follow-Up: At [facility], you and your health needs are our priority.  As part of our continuing mission to provide you with exceptional heart care, we have created designated Provider Care Teams.  These Care Teams include your primary Cardiologist (physician) and Advanced Practice Providers (APPs -  Physician Assistants and Nurse Practitioners) who all work together to provide you with the care you need, when you need it.  . You will need a follow up appointment in 12 months  . Providers on your designated Care Team:   . Christopher Berge, NP . Ryan Dunn, PA-C . Jacquelyn Visser, PA-C  Any Other Special Instructions Will Be Listed Below (If Applicable).  COVID-19 Vaccine Information can be found at: [email or web address] For questions related to vaccine distribution or appointments, please email [email or web address] or call [phone].     

## 2020-05-03 NOTE — Progress Notes (Signed)
Medication Samples have been provided to the patient.  Drug name: Bystolic Strength: 10 mg         Qty: 1 box   LOT: [id number]   Exp.Date: 09/2020

## 2020-05-18 ENCOUNTER — Ambulatory Visit (INDEPENDENT_AMBULATORY_CARE_PROVIDER_SITE_OTHER): Payer: Medicare Other

## 2020-05-18 DIAGNOSIS — Z23 Encounter for immunization: Secondary | ICD-10-CM

## 2020-08-02 ENCOUNTER — Other Ambulatory Visit: Payer: Self-pay | Admitting: Family Medicine

## 2020-08-09 ENCOUNTER — Other Ambulatory Visit: Payer: Self-pay | Admitting: Cardiovascular Disease

## 2020-08-09 ENCOUNTER — Other Ambulatory Visit: Payer: Self-pay | Admitting: Family Medicine

## 2020-09-11 ENCOUNTER — Other Ambulatory Visit: Payer: Self-pay | Admitting: Family Medicine

## 2020-09-11 DIAGNOSIS — I10 Essential (primary) hypertension: Secondary | ICD-10-CM

## 2020-09-11 DIAGNOSIS — Z125 Encounter for screening for malignant neoplasm of prostate: Secondary | ICD-10-CM

## 2020-09-20 ENCOUNTER — Other Ambulatory Visit: Payer: Self-pay

## 2020-09-20 ENCOUNTER — Other Ambulatory Visit (INDEPENDENT_AMBULATORY_CARE_PROVIDER_SITE_OTHER): Payer: Medicare Other

## 2020-09-20 DIAGNOSIS — I10 Essential (primary) hypertension: Secondary | ICD-10-CM

## 2020-09-20 DIAGNOSIS — Z125 Encounter for screening for malignant neoplasm of prostate: Secondary | ICD-10-CM | POA: Diagnosis not present

## 2020-09-20 LAB — COMPREHENSIVE METABOLIC PANEL
ALT: 22 U/L (ref 0–53)
AST: 23 U/L (ref 0–37)
Albumin: 4.6 g/dL (ref 3.5–5.2)
Alkaline Phosphatase: 59 U/L (ref 39–117)
BUN: 23 mg/dL (ref 6–23)
CO2: 33 mEq/L — ABNORMAL HIGH (ref 19–32)
Calcium: 9.8 mg/dL (ref 8.4–10.5)
Chloride: 102 mEq/L (ref 96–112)
Creatinine, Ser: 1.14 mg/dL (ref 0.40–1.50)
GFR: 61.64 mL/min (ref >60.00)
Glucose, Bld: 106 mg/dL — ABNORMAL HIGH (ref 70–99)
Potassium: 4.3 mEq/L (ref 3.5–5.1)
Sodium: 142 mEq/L (ref 135–145)
Total Bilirubin: 0.6 mg/dL (ref 0.2–1.2)
Total Protein: 7.8 g/dL (ref 6.0–8.3)

## 2020-09-20 LAB — CBC WITH DIFFERENTIAL/PLATELET
Basophils Absolute: 0.1 10*3/uL (ref 0.0–0.1)
Basophils Relative: 0.6 % (ref 0.0–3.0)
Eosinophils Absolute: 0.5 10*3/uL (ref 0.0–0.7)
Eosinophils Relative: 5 % (ref 0.0–5.0)
HCT: 48.9 % (ref 39.0–52.0)
Hemoglobin: 16.3 g/dL (ref 13.0–17.0)
Lymphocytes Relative: 32.4 % (ref 12.0–46.0)
Lymphs Abs: 3.3 10*3/uL (ref 0.7–4.0)
MCHC: 33.3 g/dL (ref 30.0–36.0)
MCV: 90.3 fl (ref 78.0–100.0)
Monocytes Absolute: 1.1 10*3/uL — ABNORMAL HIGH (ref 0.1–1.0)
Monocytes Relative: 11 % (ref 3.0–12.0)
Neutro Abs: 5.2 10*3/uL (ref 1.4–7.7)
Neutrophils Relative %: 51 % (ref 43.0–77.0)
Platelets: 170 10*3/uL (ref 150.0–400.0)
RBC: 5.42 Mil/uL (ref 4.22–5.81)
RDW: 13.9 % (ref 11.5–15.5)
WBC: 10.1 10*3/uL (ref 4.0–10.5)

## 2020-09-20 LAB — LIPID PANEL
Cholesterol: 149 mg/dL (ref 0–200)
HDL: 36.6 mg/dL — ABNORMAL LOW (ref >39.00)
NonHDL: 112.68
Total CHOL/HDL Ratio: 4
Triglycerides: 335 mg/dL — ABNORMAL HIGH (ref 0.0–149.0)
VLDL: 67 mg/dL — ABNORMAL HIGH (ref 0.0–40.0)

## 2020-09-20 LAB — PSA, MEDICARE: PSA: 0 ng/ml — ABNORMAL LOW (ref 0.10–4.00)

## 2020-09-20 LAB — LDL CHOLESTEROL, DIRECT: Direct LDL: 67 mg/dL

## 2020-09-27 ENCOUNTER — Other Ambulatory Visit: Payer: Self-pay

## 2020-09-27 ENCOUNTER — Ambulatory Visit (INDEPENDENT_AMBULATORY_CARE_PROVIDER_SITE_OTHER): Payer: Medicare Other | Admitting: Family Medicine

## 2020-09-27 ENCOUNTER — Encounter: Payer: Self-pay | Admitting: Family Medicine

## 2020-09-27 VITALS — BP 136/74 | HR 86 | Temp 97.3°F | Ht 71.0 in | Wt 175.0 lb

## 2020-09-27 DIAGNOSIS — Z7189 Other specified counseling: Secondary | ICD-10-CM

## 2020-09-27 DIAGNOSIS — Z9889 Other specified postprocedural states: Secondary | ICD-10-CM | POA: Diagnosis not present

## 2020-09-27 DIAGNOSIS — Z0000 Encounter for general adult medical examination without abnormal findings: Secondary | ICD-10-CM

## 2020-09-27 DIAGNOSIS — I10 Essential (primary) hypertension: Secondary | ICD-10-CM

## 2020-09-27 DIAGNOSIS — H8110 Benign paroxysmal vertigo, unspecified ear: Secondary | ICD-10-CM

## 2020-09-27 DIAGNOSIS — E785 Hyperlipidemia, unspecified: Secondary | ICD-10-CM

## 2020-09-27 DIAGNOSIS — N289 Disorder of kidney and ureter, unspecified: Secondary | ICD-10-CM | POA: Diagnosis not present

## 2020-09-27 DIAGNOSIS — J3089 Other allergic rhinitis: Secondary | ICD-10-CM | POA: Diagnosis not present

## 2020-09-27 MED ORDER — PROMETHAZINE HCL 25 MG PO TABS: 25.0000 mg | ORAL_TABLET | Freq: Four times a day (QID) | ORAL | 3 refills | Status: DC | PRN

## 2020-09-27 NOTE — Patient Instructions (Signed)
Keep working on diet and exercise and recheck fasting labs in about 1 month.  Take care.  Glad to see you. I'll check with urology.

## 2020-09-27 NOTE — Progress Notes (Signed)
This visit occurred during the SARS-CoV-2 public health emergency.  Safety protocols were in place, including screening questions prior to the visit, additional usage of staff PPE, and extensive cleaning of exam room while observing appropriate contact time as indicated for disinfecting solutions.  I have personally reviewed the Medicare Annual Wellness questionnaire and have noted 1. The patient's medical and social history 2. Their use of alcohol, tobacco or illicit drugs 3. Their current medications and supplements 4. The patient's functional ability including ADL's, fall risks, home safety risks and hearing or visual             impairment. 5. Diet and physical activities 6. Evidence for depression or mood disorders  The patients weight, height, BMI have been recorded in the chart and visual acuity is per eye clinic.  I have made referrals, counseling and provided education to the patient based review of the above and I have provided the pt with a written personalized care plan for preventive services.  Provider list updated- see scanned forms.  Routine anticipatory guidance given to patient.  See health maintenance. The possibility exists that previously documented standard health maintenance information may have been brought forward from a previous encounter into this note.  If needed, that same information has been updated to reflect the current situation based on today's encounter.    Shingles up to date.  Flu 2020 PNA up to date.  Tetanus up to date.  covid vaccine done.  Colonoscopy 2019 PSA still zero. d/w pt.  Advance directive- wife designated if patient were incapacitated. Cognitive function addressed- see scanned forms- and if abnormal then additional documentation follows.   Rare use of phenergan, occ used in the fall of the year.  It helped with vertigo.  Rx sent.  He has seen Dr. Bernardo Heater with urology.  Patient had unanswered questions about prev urology w/u and had  cancelled the scan.  We talked about the rationale for the scan and I told him I would contact urology.  History of aorta repair.  Vascular f/u at [facility].  He had repeat scan and is on 2 year follow up.  No chest pain.  Doing well.  Hypertension:    Using medication without problems or lightheadedness: yes Chest pain with exertion:no Edema: no Short of breath:no  Elevated Cholesterol: Using medications without problems: yes Muscle aches: no Diet compliance: encouraged.  Generally healthy diet.   Exercise: yes, active, at baseline.   TG elevation noted on labs.    Only need flovent during allergy season, no recent SABA use or needed.   PMH and SH reviewed  Meds, vitals, and allergies reviewed.   ROS: Per HPI.  Unless specifically indicated otherwise in HPI, the patient denies:  General: fever. Eyes: acute vision changes ENT: sore throat Cardiovascular: chest pain Respiratory: SOB GI: vomiting GU: dysuria Musculoskeletal: acute back pain Derm: acute rash Neuro: acute motor dysfunction Psych: worsening mood Endocrine: polydipsia Heme: bleeding Allergy: hayfever  GEN: nad, alert and oriented HEENT: mucous membranes moist NECK: supple w/o LA CV: rrr. PULM: ctab, no inc wob ABD: soft, +bs EXT: no edema SKIN: no acute rash

## 2020-09-28 NOTE — Assessment & Plan Note (Signed)
Continue as needed Phenergan.

## 2020-09-28 NOTE — Assessment & Plan Note (Signed)
Blood pressure controlled.  Continue amlodipine hydrochlorothiazide nebivolol.

## 2020-09-28 NOTE — Assessment & Plan Note (Signed)
He typically only has allergy symptoms during part of the year and that is when he uses Flovent.  He will update me as needed.

## 2020-09-28 NOTE — Assessment & Plan Note (Signed)
Advance directive- wife designated if patient were incapacitated.  

## 2020-09-28 NOTE — Assessment & Plan Note (Signed)
I will check with urology and we will go from there.  Discussed with patient.

## 2020-09-28 NOTE — Assessment & Plan Note (Signed)
Triglyceride elevation noted.  Unclear cause.  Discussed with patient.  Continue work on diet and exercise and recheck lipids at about 1 month.  Continue atorvastatin in the meantime.

## 2020-09-28 NOTE — Assessment & Plan Note (Signed)
History of aorta repair.  Vascular f/u at [facility].  He had repeat scan and is on 2 year follow up.  No chest pain.  Doing well.

## 2020-09-28 NOTE — Assessment & Plan Note (Signed)
Shingles up to date.  Flu 2020 PNA up to date.  Tetanus up to date.  covid vaccine done.  Colonoscopy 2019 PSA still zero. d/w pt.  Advance directive- wife designated if patient were incapacitated. Cognitive function addressed- see scanned forms- and if abnormal then additional documentation follows.

## 2020-10-03 ENCOUNTER — Other Ambulatory Visit: Payer: Self-pay | Admitting: Urology

## 2020-10-03 DIAGNOSIS — N1330 Unspecified hydronephrosis: Secondary | ICD-10-CM

## 2020-10-12 ENCOUNTER — Other Ambulatory Visit: Payer: Self-pay

## 2020-10-12 ENCOUNTER — Encounter
Admission: RE | Admit: 2020-10-12 | Discharge: 2020-10-12 | Disposition: A | Discharge status: Home / Self Care | Payer: Medicare Other | Source: Ambulatory Visit | Attending: Urology | Admitting: Urology

## 2020-10-12 DIAGNOSIS — N1330 Unspecified hydronephrosis: Secondary | ICD-10-CM | POA: Insufficient documentation

## 2020-10-12 MED ORDER — FUROSEMIDE 10 MG/ML IJ SOLN
39.7000 mg | Freq: Once | INTRAMUSCULAR | Status: DC
  Filled 2020-10-12: qty 4

## 2020-10-13 MED ORDER — TECHNETIUM TC 99M MEBROFENIN IV KIT
4.6720 | PACK | Freq: Once | INTRAVENOUS | Status: AC | PRN
  Administered 2020-10-12: 4.672 via INTRAVENOUS

## 2020-10-14 ENCOUNTER — Ambulatory Visit: Payer: Medicare Other | Admitting: Urology

## 2020-10-14 ENCOUNTER — Encounter: Payer: Self-pay | Admitting: Urology

## 2020-10-14 ENCOUNTER — Other Ambulatory Visit: Payer: Self-pay

## 2020-10-14 VITALS — BP 124/73 | HR 66 | Ht 72.0 in | Wt 175.0 lb

## 2020-10-14 DIAGNOSIS — N200 Calculus of kidney: Secondary | ICD-10-CM

## 2020-10-14 DIAGNOSIS — N281 Cyst of kidney, acquired: Secondary | ICD-10-CM

## 2020-10-14 DIAGNOSIS — N1330 Unspecified hydronephrosis: Secondary | ICD-10-CM | POA: Diagnosis not present

## 2020-10-14 DIAGNOSIS — Z8546 Personal history of malignant neoplasm of prostate: Secondary | ICD-10-CM

## 2020-10-14 NOTE — Progress Notes (Signed)
10/14/2020 12:21 PM   [facility] 06/15/1941 [id number]  Referring provider: Tonia Ghent, MD [address] [place],   [address]  Chief Complaint  Patient presents with  . Other    Urologic history: 1.  Renal cysts -2.3 x 2 cm hyperdense midpole right renal cyst, Bosniak 2 -Exophytic simple left renal cyst -4.6 x 4.6 right upper pole simple renal cyst -Cluster large right lower pole renal cysts with peripheral calcification and internal septation; no evidence of enhancement; stable since 2010; Bosniak 2 -Small hypodense lesions bilaterally statistically consistent with simple renal cysts  2.  Nephrolithiasis -Nonobstructing right renal calculi  3.  Prostate cancer -Open radical retropubic prostatectomy 1995  4.  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 male presents for follow-up.   Last office visit July 2021  Prior CT felt to show right hydronephrosis either secondary to mass-effect from right renal cyst or UPJ obstruction  Diuretic renogram was recommended and scheduled however patient decided to cancel  In recent follow-up with his PCP rescheduling was recommended  Diuretic renogram performed on 10/12/2020 showed differential renal function at 55% on the left and 45% on the right.  The right renal collecting system was dilated however good washout following Lasix administration was noted and there was no evidence of obstruction  CT performed at [facility] showed stable renal cyst; images not available for review   PMH: Past Medical History:  Diagnosis Date  . Allergic rhinitis   . Allergy   . 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 Arthritis   . BCC (basal cell carcinoma of skin) 2014  . Blood transfusion without reported diagnosis    he thinks 2010 with aortix dissection surgery  . Carotid bruit    Right. Carotid u/s 2/11 normal.  . Cataract 2020    bilateral eyes  . CHF (congestive heart failure) ([place])   . Complication of anesthesia   . Diverticulosis   . Family history of adverse reaction to anesthesia   . History of kidney stones    1980  . HTN (hypertension)   . Hyperlipidemia   . Irregular heart beat    " extra beat " per pt   . 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 Pneumonia    2018  . PONV (postoperative nausea and vomiting)   . Prostate cancer ([place])    1995  . Pulmonary nodule    Stable on CT 2/11  . S/P CABG x 1    for aneuryms repair  . Skin cancer    scalp 2007  melanoma    Surgical History: Past Surgical History:  Procedure Laterality Date  . ABDOMINAL AORTIC ANEURYSM REPAIR  march 2013  . 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 CARDIAC SURGERY  2013   at [facility]- transverse aortic arch/descending aneurysm repair s/p debranching procedure  (total of 5 Gore C-TAG devices extending from distal descending aorta to his prev placed ascending dacron graft)  . COLONOSCOPY    . Flex- Polyp   06/1999   at 38cm, Int/Ext Hemm divertics [facility])  . Hemi Arch Replacement  03/2009 and repeat 10/2011   and AoV resuspension w/ Repl of Ascending Aorta and Hemiarch w/ Graft; Pericardial Tamponade Aortic Regurg 8/22-8/28/10;  endovascular repair of aortic dilation 2013 with  ascending aorta to L common carotid,  R subclavian, R common carotid and L carotid subclavian bypass  . [facility]  08/22-8/28/2010   Thoracic aortic dissection pericardial teamponade aortic regurg  . INGUINAL HERNIA REPAIR Right 12/24/2017   Procedure: HERNIA REPAIR INGUINAL ADULT;  Surgeon: Greer Pickerel, MD;  Location: [facility];  Service: General;  Laterality: Right;  . INSERTION OF MESH Right 12/24/2017   Procedure: INSERTION OF MESH;  Surgeon: Greer Pickerel, MD;  Location: [facility];  Service: General;  Laterality: Right;  . melanoma removed from scalp  06/2006   [place]  . PENILE PROSTHESIS IMPLANT   1996  . POLYPECTOMY    . PROSTATECTOMY  12/1993   Dr. Risa Grill  . WISDOM TOOTH EXTRACTION  10/2005    Home Medications:  Allergies as of 10/14/2020      Reactions   Amlodipine Other (See Comments)   Edema at 10mg  a day   Lactose Intolerance (gi) Diarrhea, Other (See Comments)   Bloating, gas      Medication List       Accurate as of October 14, 2020 12:21 PM. If you have any questions, ask your nurse or doctor.        albuterol 108 (90 Base) MCG/ACT inhaler Commonly known as: VENTOLIN HFA Inhale 2 puffs into the lungs every 6 (six) hours as needed for wheezing or shortness of breath.   amLODipine 5 MG tablet Commonly known as: NORVASC TAKE 1 TABLET BY MOUTH ONCE DAILY   atorvastatin 20 MG tablet Commonly known as: LIPITOR Take 1 tablet (20 mg total) by mouth at bedtime.   Bystolic 20 MG Tabs Generic drug: Nebivolol HCl Take 1 tablet (20 mg total) by mouth daily.   Flovent HFA 110 MCG/ACT inhaler Generic drug: fluticasone INHALE 1 PUFF INTO THE LUNGS TWICE DAILY*RINSE MOUTH AFTER USE*   fluticasone 50 MCG/ACT nasal spray Commonly known as: FLONASE USE 1 SPRAY IN EACH NOSTRIL ONCE DAILY AS NEEDED FOR CONGESTION   hydrochlorothiazide 25 MG tablet Commonly known as: HYDRODIURIL Take 1 tablet (25 mg total) by mouth every other day.   Lutein-Zeaxanthin 25-5 MG Caps Take 1 capsule by mouth daily.   meclizine 25 MG tablet Commonly known as: ANTIVERT Take 1 tablet (25 mg total) by mouth 3 (three) times daily as needed for dizziness.   promethazine 25 MG tablet Commonly known as: PHENERGAN Take 1 tablet (25 mg total) by mouth every 6 (six) hours as needed for nausea (or vertigo).       Allergies:  Allergies  Allergen Reactions  . Amlodipine Other (See Comments)    Edema at 10mg  a day  . Lactose Intolerance (Gi) Diarrhea and Other (See Comments)    Bloating, gas    Family History: Family History  Problem Relation Age of Onset  . Heart failure Mother         died age 3  . Hypertension Mother   . Heart disease Mother        CHF  . Kidney failure Father   . Heart disease Father        coronary disease, CABG  . Stroke Father        due to "infection" from [place]  . Kidney disease Father        chronic  . Prostate cancer Father   . Diabetes Other   . Colon polyps Sister   . Diabetes Other   . Hyperlipidemia Daughter   . Colon cancer Neg Hx   . Esophageal cancer Neg Hx   . Stomach  cancer Neg Hx   . Rectal cancer Neg Hx     Social History:  reports that he quit smoking about 26 years ago. His smoking use included cigarettes. He has a 25.00 pack-year smoking history. He has never used smokeless tobacco. He reports current alcohol use. He reports that he does not use drugs.   Physical Exam: BP 124/73   Pulse 66   Ht 6' (1.829 m)   Wt 175 lb (79.4 kg)   BMI 23.73 kg/m   Constitutional:  Alert and oriented, No acute distress. HEENT: [place] AT, moist mucus membranes.  Trachea midline, no masses. Cardiovascular: No clubbing, cyanosis, or edema. Respiratory: Normal respiratory effort, no increased work of breathing.   Assessment & Plan:    1.  Bilateral renal cysts  Stable Bosniak 1/Bosniak 2 renal cysts; no follow-up imaging needed however patient does undergo periodic scanning of aneurysms at [facility].  He states imaging has been increased to every 2 years  Follow-up visit 2 years  2. Right hydronephrosis  No obstruction on Lasix renogram  3.  Right nephrolithiasis  Nonobstructing right renal calculi, stable  4.  History of prostate cancer  Status post radical prostatectomy [place], MD  [facility] [address], [place], [place] [address] [phone]

## 2020-10-15 ENCOUNTER — Other Ambulatory Visit: Payer: Self-pay | Admitting: Family Medicine

## 2020-10-15 ENCOUNTER — Encounter: Payer: Self-pay | Admitting: Urology

## 2020-10-15 DIAGNOSIS — N1330 Unspecified hydronephrosis: Secondary | ICD-10-CM | POA: Insufficient documentation

## 2020-10-17 NOTE — Telephone Encounter (Signed)
Pharmacy requests refill on: Fluticasone 50 mcg/act nasal spray  LAST REFILL: 08/02/2021 (Q-16 g, R-0) LAST OV: 09/27/2020 NEXT OV: Not Scheduled  PHARMACY: [facility]

## 2020-10-18 ENCOUNTER — Telehealth: Payer: Self-pay | Admitting: Family Medicine

## 2020-10-18 NOTE — Telephone Encounter (Signed)
LVM for pt to rtn my call to schedule awv with nha. 

## 2020-10-25 ENCOUNTER — Other Ambulatory Visit: Payer: Self-pay

## 2020-10-25 ENCOUNTER — Other Ambulatory Visit (INDEPENDENT_AMBULATORY_CARE_PROVIDER_SITE_OTHER): Payer: Medicare Other

## 2020-10-25 DIAGNOSIS — E785 Hyperlipidemia, unspecified: Secondary | ICD-10-CM

## 2020-10-25 LAB — LIPID PANEL
Cholesterol: 129 mg/dL (ref 0–200)
HDL: 38.3 mg/dL — ABNORMAL LOW (ref >39.00)
LDL Cholesterol: 62 mg/dL (ref 0–99)
NonHDL: 90.98
Total CHOL/HDL Ratio: 3
Triglycerides: 146 mg/dL (ref 0.0–149.0)
VLDL: 29.2 mg/dL (ref 0.0–40.0)

## 2021-01-02 ENCOUNTER — Other Ambulatory Visit: Payer: Self-pay | Admitting: Family Medicine

## 2021-04-05 ENCOUNTER — Telehealth (INDEPENDENT_AMBULATORY_CARE_PROVIDER_SITE_OTHER): Payer: Medicare Other | Admitting: Family Medicine

## 2021-04-05 ENCOUNTER — Encounter: Payer: Self-pay | Admitting: Family Medicine

## 2021-04-05 ENCOUNTER — Telehealth: Payer: Self-pay

## 2021-04-05 VITALS — BP 136/52 | Temp 96.6°F | Ht 71.0 in | Wt 168.0 lb

## 2021-04-05 DIAGNOSIS — U071 COVID-19: Secondary | ICD-10-CM

## 2021-04-05 MED ORDER — MOLNUPIRAVIR EUA 200MG CAPSULE: 4.0000 | ORAL_CAPSULE | Freq: Two times a day (BID) | ORAL | 0 refills | Status: AC

## 2021-04-05 NOTE — Telephone Encounter (Signed)
Agree. Thanks

## 2021-04-05 NOTE — Progress Notes (Signed)
      Tally Mattox T. Laycee Fitzsimmons, MD Primary Care and [place] at [facility] [place], [address] Phone: [phone]  FAX: [place] - 80 y.o. male  MRN [id number]  Date of Birth: 1941-03-06  Visit Date: 04/05/2021  PCP: Tonia Ghent, MD  Referred by: Tonia Ghent, MD  Virtual Visit via Video Note:  I connected with  Silvio Pate on 04/05/2021  3:20 PM EDT by a video enabled telemedicine application and verified that I am speaking with the correct person using two identifiers.   Location patient: home computer, tablet, or smartphone Location provider: work or home office Consent: Verbal consent directly obtained from [employer]. Persons participating in the virtual visit: patient, provider  I discussed the limitations of evaluation and management by telemedicine and the availability of in person appointments. The patient expressed understanding and agreed to proceed.  Chief Complaint  Patient presents with   Covid Positive    Positive home test this morning-Symptoms started yesterday afternoon   Cough    Dry   Headache   Nasal Congestion   Sore Throat    History of Present Illness:  Very nice gentleman, COVID plus at home with symptoms that started the day before.  His wife also had some COVID plus symptoms starting 2 days prior to his onset of symptoms.  As above ST is pretty bad Runny nose Dry cough No fever HA 96 this AM No SOB No neurological changes No GI changes  Review of Systems as above: See pertinent positives and pertinent negatives per HPI No acute distress verbally   Observations/Objective/Exam:  An attempt was made to discern vital signs over the phone and per patient if applicable and possible.   General:    Alert, Oriented, appears well and in no acute distress  Pulmonary:     On inspection no signs of respiratory distress.  Psych / Neurological:      Pleasant and cooperative.  Assessment and Plan:    ICD-10-CM   1. COVID-19  U07.1      Continue with supportive care and initiate antivirals.  Patient specifically requested antiviral below.  I discussed the assessment and treatment plan with the patient. The patient was provided an opportunity to ask questions and all were answered. The patient agreed with the plan and demonstrated an understanding of the instructions.   The patient was advised to call back or seek an in-person evaluation if the symptoms worsen or if the condition fails to improve as anticipated.  Follow-up: prn unless noted otherwise below No follow-ups on file.  Meds ordered this encounter  Medications   molnupiravir EUA 200 mg CAPS    Sig: Take 4 capsules (800 mg total) by mouth 2 (two) times daily for 5 days.    Dispense:  40 capsule    Refill:  0   No orders of the defined types were placed in this encounter.   Signed,  Maud Deed. Artis Beggs, MD

## 2021-04-05 NOTE — Telephone Encounter (Signed)
Pt tested + covid at home on 04/05/21; symptoms began on 04/04/21 with chills,Scratchy S/T, dry cough,no fever; H/A with pain level 2 -3. No SOB or CP; no diarrhea or vomiting. Pt took mucinex DM this morning and tylenol 500 mg last night. Pt does have runny nose and head congestion. Pt scheduled video visit 04/05/21 at 3:20 with Dr Lorelei Pont.Self quarantine, drink plenty of fluids, rest, and take Tylenol for fever. UC & ED precautions given and pt voiced understanding.pt will have vital signs ready when CMA calls and pt will need instruction on how to connect for video visit.sending the note to Dr Lorelei Pont and Dr Damita Dunnings as Juluis Rainier since Dr Damita Dunnings is out of office.

## 2021-04-24 ENCOUNTER — Other Ambulatory Visit: Payer: Self-pay | Admitting: Cardiovascular Disease

## 2021-05-09 NOTE — Progress Notes (Signed)
Cardiology Office Note  Date:  05/10/2021   ID:  [facility], Ray [facility] 07/01/1941, MRN [id number]  PCP:  Ray Ghent, MD   Chief Complaint  Patient presents with   12 month follow up     "Doing well." Medications reviewed by the patient verbally.     HPI:  [facility] is a 80 y/o male with h/o HTN,   hyperlipidemia,  Type A aortic dissection in 2/62 complicated by cardiac tamponade,  CT showed extension of the dissection from the aortic root all the way down to the femoral arteries,  s/p aortic root replacement of hemiarch repair and aortic valve resuspension.  Post-op course complicated by persistent HTN and atrial flutter treated with amio (stopped in January 2011).   Followed by cardiothoracic surgery at [facility] on an annual basis with CT scan surveillance.  CT September 2014 showing remodeling of his thoracic aorta, overall looked good/stable. He presents for routine followup of his of his hypertension And aortic dissection  On today's visit reports feeling well, Seen at [facility], by vascular February 2022 CT at [facility]: Reviewed with him 1. Stable endovascular repair of the thoracic aorta and debranching  procedure with no significant change in appearance of the thoracic and  abdominal aorta. The debranching graft supplying the vessels and left  common carotid to left subclavian artery bypass graft are patent.   2. Below the level of the aortic stent is a residual aortic dissection  which terminates in the bilateral iliac arteries; the extent of the  dissection and size of aorta has not significantly changed.   3. 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   4. Left vertebral artery at the thoracocervical spine is no longer  opacified, raising the concern of high grade stenosis.   Having vertigo, "fall every year" Covid  aug 2022, he and his wife have recovered  BP at home 035  systolic with activity 597 to 140 with rest at home Likes to take HCTZ every other day, amlodipine 5, reports having leg swelling on 10 mg.  Continues on bystolic 20 daily Does not want to do more medication than what he is taking currently for blood pressure  EKG personally reviewed by myself on todays visit NSR rate 66 bpm, PVC  Other past medical history reviewed chronic dizziness Take meclizine as needed  Lab Results  Component Value Date   CHOL 129 10/25/2020   HDL 38.30 (L) 10/25/2020   LDLCALC 62 10/25/2020   TRIG 146.0 10/25/2020    Echo MILD LV DYSFUNCTION (See above) WITH MILD LVH, 50%   NORMAL LA PRESSURES WITH DIASTOLIC DYSFUNCTION   NORMAL RIGHT VENTRICULAR SYSTOLIC FUNCTION   VALVULAR REGURGITATION: MILD AR, TRIVIAL MR, TRIVIAL PR, TRIVIAL TR   NO VALVULAR STENOSIS  In [facility] 08/21/2016 to 1/15 Had flu and PNA 08/2016 CT 08/26/2016 at [place] [facility]   Prior ECHO showed EF 45-50%. Had cardiac CT in 12/10 after abnormal myvoiew. Coronaries normal except for 25% RCA. 5mm stable pulmonary nodule.    echo (02/2010) EF 55-60% with grade 1 diastolic dysfx and previous AO dissection flap.     PMH:   has a past medical history of Allergic rhinitis, Allergy, Aortic arch aneurysm ([facility]) (10/2011), Aortic dissection ([facility]) (2010), Arthritis, BCC (basal cell carcinoma of skin) (2014), Blood transfusion without reported diagnosis, Carotid bruit, Cataract (2020), CHF (congestive heart failure) ([facility]), Complication of anesthesia, Diverticulosis, Family history of adverse  reaction to anesthesia, History of kidney stones, HTN (hypertension), Hyperlipidemia, Irregular heart beat, Macular degeneration of left eye (2020), Pneumonia, PONV (postoperative nausea and vomiting), Prostate cancer ([facility]), Pulmonary nodule, S/P CABG x 1, and Skin cancer.  PSH:    Past Surgical History:  Procedure Laterality Date   ABDOMINAL AORTIC ANEURYSM REPAIR  march 2013   Axillary Mass removal  05/2000   Neurofibroma-  ongoing left hand numbness ([facility])   CARDIAC SURGERY  2013   at [facility]- transverse aortic arch/descending aneurysm repair s/p debranching procedure  (total of 5 Gore C-TAG devices extending from distal descending aorta to his prev placed ascending dacron graft)   COLONOSCOPY     Flex- Polyp   06/1999   at 38cm, Int/Ext Hemm divertics Fuller Plan)   Hemi Arch Replacement  03/2009 and repeat 10/2011   and AoV resuspension w/ Repl of Ascending Aorta and Hemiarch w/ Graft; Pericardial Tamponade Aortic Regurg 8/22-8/28/10;  endovascular repair of aortic dilation 2013 with  ascending aorta to L common carotid, R subclavian, R common carotid and L carotid subclavian bypass   [facility]  08/22-8/28/2010   Thoracic aortic dissection pericardial teamponade aortic regurg   INGUINAL HERNIA REPAIR Right 12/24/2017   Procedure: HERNIA REPAIR INGUINAL ADULT;  Surgeon: Greer Pickerel, MD;  Location: [facility];  Service: General;  Laterality: Right;   INSERTION OF MESH Right 12/24/2017   Procedure: INSERTION OF MESH;  Surgeon: Greer Pickerel, MD;  Location: [facility];  Service: General;  Laterality: Right;   melanoma removed from scalp  06/2006   [place]   POLYPECTOMY     PROSTATECTOMY  12/1993   Dr. Risa Grill   WISDOM TOOTH EXTRACTION  10/2005    Current Outpatient Medications  Medication Sig Dispense Refill   albuterol (PROVENTIL HFA;VENTOLIN HFA) 108 (90 Base) MCG/ACT inhaler Inhale 2 puffs into the lungs every 6 (six) hours as needed for wheezing or shortness of breath. 1 Inhaler 0   amLODipine (NORVASC) 5 MG tablet TAKE 1 TABLET BY MOUTH ONCE DAILY 90 tablet 3   atorvastatin (LIPITOR) 20 MG tablet Take 1 tablet (20 mg total) by mouth at bedtime. 90 tablet 3   fluticasone (FLONASE) 50 MCG/ACT nasal spray USE 1 SPRAY IN EACH NOSTRIL ONCE DAILY AS NEEDED FOR CONGESTION 16 g 3   fluticasone (FLOVENT HFA) 110 MCG/ACT inhaler INHALE 1 PUFF INTO THE LUNGS TWICE DAILY*RINSE MOUTH AFTER USE* 12  g 5   hydrochlorothiazide (HYDRODIURIL) 25 MG tablet Take 1 tablet (25 mg total) by mouth every other day. 90 tablet 3   Lutein-Zeaxanthin 25-5 MG CAPS Take 1 capsule by mouth daily.     meclizine (ANTIVERT) 25 MG tablet Take 1 tablet (25 mg total) by mouth 3 (three) times daily as needed for dizziness.     Nebivolol HCl 20 MG TABS TAKE 1 TABLET BY MOUTH ONCE A DAY 90 tablet 0   promethazine (PHENERGAN) 25 MG tablet Take 1 tablet (25 mg total) by mouth every 6 (six) hours as needed for nausea (or vertigo). 30 tablet 3   No current facility-administered medications for this visit.     Allergies:   Amlodipine and Lactose intolerance (gi)   Social History:  The patient  reports that he quit smoking about 26 years ago. His smoking use included cigarettes. He has a 25.00 pack-year smoking history. He has never used smokeless tobacco. He reports current alcohol use. He reports that he does not use drugs.   Family  History:   family history includes Colon polyps in his sister; Diabetes in some other family members; Heart disease in his father and mother; Heart failure in his mother; Hyperlipidemia in his daughter; Hypertension in his mother; Kidney disease in his father; Kidney failure in his father; Prostate cancer in his father; Stroke in his father.    Review of Systems: Review of Systems  Constitutional: Negative.   HENT: Negative.    Cardiovascular: Negative.   Gastrointestinal: Negative.   Musculoskeletal: Negative.   Neurological: Negative.   Psychiatric/Behavioral: Negative.    All other systems reviewed and are negative.  PHYSICAL EXAM: VS:  BP (!) 150/80 (BP Location: Left Arm, Patient Position: Sitting, Cuff Size: Normal)   Pulse 66   Ht 6' (1.829 m)   Wt 169 lb 4 oz (76.8 kg)   SpO2 98%   BMI 22.95 kg/m  , BMI Body mass index is 22.95 kg/m. Constitutional:  oriented to person, place, and time. No distress.  HENT:  Head: Grossly normal Eyes:  no discharge. No scleral  icterus.  Neck: No JVD, no carotid bruits  Cardiovascular: Regular rate and rhythm, no murmurs appreciated Pulmonary/Chest: Clear to auscultation bilaterally, no wheezes or rails Abdominal: Soft.  no distension.  no tenderness.  Musculoskeletal: Normal range of motion Neurological:  normal muscle tone. Coordination normal. No atrophy Skin: Skin warm and dry Psychiatric: normal affect, pleasant  Recent Labs: 09/20/2020: ALT 22; BUN 23; Creatinine, Ser 1.14; Hemoglobin 16.3; Platelets 170.0; Potassium 4.3; Sodium 142    Lipid Panel Lab Results  Component Value Date   CHOL 129 10/25/2020   HDL 38.30 (L) 10/25/2020   LDLCALC 62 10/25/2020   TRIG 146.0 10/25/2020      Wt Readings from Last 3 Encounters:  05/10/21 169 lb 4 oz (76.8 kg)  04/05/21 168 lb (76.2 kg)  10/14/20 175 lb (79.4 kg)     ASSESSMENT AND PLAN:  Essential hypertension -  Discussed various treatment options for his blood pressure, he does not want to make any changes at this time.  Does not want HCTZ daily and prefers not to increase the amlodipine given concern for leg edema on high-dose We will continue to monitor pressures at home  Coronary artery disease involving native coronary artery of native heart without angina pectoris Cholesterol at goal Denies angina  Frequent PVCs Asymptomatic, noted previously, on beta-blocker  Dissection of thoracic aorta [facility]), H/O ascending aorta repair Followed at [facility], scan every 24 months He reports repeat scans scheduled February 2022 Results reviewed He will discuss possible vertebral stenosis on the left with Dr. Ysidro Evert and contact our office if further work-up is needed Reports he is asymptomatic.  Cholesterol at goal   Total encounter time more than 25 minutes  Greater than 50% was spent in counseling and coordination of care with the patient    No orders of the defined types were placed in this encounter.    Signed, Esmond Plants, M.D.,  Ph.D. 05/10/2021  [place], [place]

## 2021-05-10 ENCOUNTER — Encounter: Payer: Self-pay | Admitting: Cardiovascular Disease

## 2021-05-10 ENCOUNTER — Ambulatory Visit: Payer: Medicare Other | Admitting: Cardiovascular Disease

## 2021-05-10 ENCOUNTER — Other Ambulatory Visit: Payer: Self-pay

## 2021-05-10 VITALS — BP 150/80 | HR 66 | Ht 72.0 in | Wt 169.2 lb

## 2021-05-10 DIAGNOSIS — I359 Nonrheumatic aortic valve disorder, unspecified: Secondary | ICD-10-CM | POA: Diagnosis not present

## 2021-05-10 DIAGNOSIS — E785 Hyperlipidemia, unspecified: Secondary | ICD-10-CM

## 2021-05-10 DIAGNOSIS — I25118 Atherosclerotic heart disease of native coronary artery with other forms of angina pectoris: Secondary | ICD-10-CM

## 2021-05-10 DIAGNOSIS — Z9889 Other specified postprocedural states: Secondary | ICD-10-CM

## 2021-05-10 DIAGNOSIS — I10 Essential (primary) hypertension: Secondary | ICD-10-CM

## 2021-05-10 DIAGNOSIS — I4892 Unspecified atrial flutter: Secondary | ICD-10-CM

## 2021-05-10 MED ORDER — HYDROCHLOROTHIAZIDE 25 MG PO TABS: 25.0000 mg | ORAL_TABLET | ORAL | 4 refills | Status: DC

## 2021-05-10 MED ORDER — NEBIVOLOL HCL 20 MG PO TABS: 1.0000 | ORAL_TABLET | Freq: Every day | ORAL | 4 refills | Status: DC

## 2021-05-10 MED ORDER — AMLODIPINE BESYLATE 5 MG PO TABS: ORAL_TABLET | ORAL | 4 refills | Status: DC

## 2021-05-10 MED ORDER — ATORVASTATIN CALCIUM 20 MG PO TABS: 20.0000 mg | ORAL_TABLET | Freq: Every day | ORAL | 4 refills | Status: DC

## 2021-05-10 NOTE — Patient Instructions (Addendum)
Talk with Dr. Ysidro Evert, See if verterbral artery stenosis on left is an issue and needs to be imaged.   Medication Instructions:  No changes  If you need a refill on your cardiac medications before your next appointment, please call your pharmacy.    Lab work: No new labs needed  Testing/Procedures: No new testing needed  Follow-Up: At [facility], you and your health needs are our priority.  As part of our continuing mission to provide you with exceptional heart care, we have created designated Provider Care Teams.  These Care Teams include your primary Cardiologist (physician) and Advanced Practice Providers (APPs -  Physician Assistants and Nurse Practitioners) who all work together to provide you with the care you need, when you need it.  You will need a follow up appointment in 12 months  Providers on your designated Care Team:   Murray Hodgkins, NP Christell Faith, PA-C Marrianne Mood, PA-C Cadence [place], [place]  COVID-19 Vaccine Information can be found at: ShippingScam.co.uk For questions related to vaccine distribution or appointments, please email vaccine@[email or web address] .[email or web address] or call [phone].

## 2021-06-21 ENCOUNTER — Other Ambulatory Visit: Payer: Self-pay | Admitting: Family Medicine

## 2021-06-22 ENCOUNTER — Encounter: Payer: Self-pay | Admitting: Family Medicine

## 2021-08-29 ENCOUNTER — Other Ambulatory Visit: Payer: Self-pay | Admitting: Family Medicine

## 2021-09-17 ENCOUNTER — Other Ambulatory Visit: Payer: Self-pay | Admitting: Family Medicine

## 2021-09-17 DIAGNOSIS — Z125 Encounter for screening for malignant neoplasm of prostate: Secondary | ICD-10-CM

## 2021-09-17 DIAGNOSIS — I10 Essential (primary) hypertension: Secondary | ICD-10-CM

## 2021-09-22 ENCOUNTER — Other Ambulatory Visit (INDEPENDENT_AMBULATORY_CARE_PROVIDER_SITE_OTHER): Payer: Medicare Other

## 2021-09-22 ENCOUNTER — Other Ambulatory Visit: Payer: Self-pay

## 2021-09-22 DIAGNOSIS — I10 Essential (primary) hypertension: Secondary | ICD-10-CM | POA: Diagnosis not present

## 2021-09-22 DIAGNOSIS — Z125 Encounter for screening for malignant neoplasm of prostate: Secondary | ICD-10-CM

## 2021-09-22 LAB — LIPID PANEL
Cholesterol: 126 mg/dL (ref 0–200)
HDL: 37.2 mg/dL — ABNORMAL LOW (ref >39.00)
LDL Cholesterol: 57 mg/dL (ref 0–99)
NonHDL: 88.44
Total CHOL/HDL Ratio: 3
Triglycerides: 155 mg/dL — ABNORMAL HIGH (ref 0.0–149.0)
VLDL: 31 mg/dL (ref 0.0–40.0)

## 2021-09-22 LAB — CBC WITH DIFFERENTIAL/PLATELET
Basophils Absolute: 0 10*3/uL (ref 0.0–0.1)
Basophils Relative: 0.3 % (ref 0.0–3.0)
Eosinophils Absolute: 0.4 10*3/uL (ref 0.0–0.7)
Eosinophils Relative: 4.7 % (ref 0.0–5.0)
HCT: 47.6 % (ref 39.0–52.0)
Hemoglobin: 15.7 g/dL (ref 13.0–17.0)
Lymphocytes Relative: 30.1 % (ref 12.0–46.0)
Lymphs Abs: 2.4 10*3/uL (ref 0.7–4.0)
MCHC: 33 g/dL (ref 30.0–36.0)
MCV: 89.6 fl (ref 78.0–100.0)
Monocytes Absolute: 1 10*3/uL (ref 0.1–1.0)
Monocytes Relative: 13 % — ABNORMAL HIGH (ref 3.0–12.0)
Neutro Abs: 4.1 10*3/uL (ref 1.4–7.7)
Neutrophils Relative %: 51.9 % (ref 43.0–77.0)
Platelets: 138 10*3/uL — ABNORMAL LOW (ref 150.0–400.0)
RBC: 5.31 Mil/uL (ref 4.22–5.81)
RDW: 13.7 % (ref 11.5–15.5)
WBC: 7.9 10*3/uL (ref 4.0–10.5)

## 2021-09-22 LAB — PSA, MEDICARE: PSA: 0 ng/ml — ABNORMAL LOW (ref 0.10–4.00)

## 2021-09-22 LAB — TSH: TSH: 3.78 u[IU]/mL (ref 0.35–5.50)

## 2021-09-22 LAB — COMPREHENSIVE METABOLIC PANEL
ALT: 19 U/L (ref 0–53)
AST: 21 U/L (ref 0–37)
Albumin: 4.5 g/dL (ref 3.5–5.2)
Alkaline Phosphatase: 62 U/L (ref 39–117)
BUN: 19 mg/dL (ref 6–23)
CO2: 35 mEq/L — ABNORMAL HIGH (ref 19–32)
Calcium: 9.8 mg/dL (ref 8.4–10.5)
Chloride: 101 mEq/L (ref 96–112)
Creatinine, Ser: 1.02 mg/dL (ref 0.40–1.50)
GFR: 69.94 mL/min (ref >60.00)
Glucose, Bld: 98 mg/dL (ref 70–99)
Potassium: 3.8 mEq/L (ref 3.5–5.1)
Sodium: 141 mEq/L (ref 135–145)
Total Bilirubin: 1 mg/dL (ref 0.2–1.2)
Total Protein: 7.7 g/dL (ref 6.0–8.3)

## 2021-09-29 ENCOUNTER — Encounter: Payer: Medicare Other | Admitting: Family Medicine

## 2021-11-07 ENCOUNTER — Encounter: Payer: Self-pay | Admitting: Family Medicine

## 2021-11-07 ENCOUNTER — Other Ambulatory Visit: Payer: Self-pay

## 2021-11-07 ENCOUNTER — Ambulatory Visit (INDEPENDENT_AMBULATORY_CARE_PROVIDER_SITE_OTHER): Payer: Medicare Other | Admitting: Family Medicine

## 2021-11-07 VITALS — BP 124/82 | HR 77 | Temp 97.6°F | Ht 72.0 in | Wt 170.0 lb

## 2021-11-07 DIAGNOSIS — I10 Essential (primary) hypertension: Secondary | ICD-10-CM | POA: Diagnosis not present

## 2021-11-07 DIAGNOSIS — Z7189 Other specified counseling: Secondary | ICD-10-CM

## 2021-11-07 DIAGNOSIS — Z0000 Encounter for general adult medical examination without abnormal findings: Secondary | ICD-10-CM | POA: Diagnosis not present

## 2021-11-07 DIAGNOSIS — J3089 Other allergic rhinitis: Secondary | ICD-10-CM

## 2021-11-07 DIAGNOSIS — E785 Hyperlipidemia, unspecified: Secondary | ICD-10-CM | POA: Diagnosis not present

## 2021-11-07 DIAGNOSIS — Z9889 Other specified postprocedural states: Secondary | ICD-10-CM

## 2021-11-07 NOTE — Patient Instructions (Signed)
Take care.  Glad to see you. ?Update me as needed.  ?Don't change your meds for now.  ?

## 2021-11-07 NOTE — Progress Notes (Signed)
I have personally reviewed the Medicare Annual Wellness questionnaire and have noted ?1. The patient's medical and social history ?2. Their use of alcohol, tobacco or illicit drugs ?3. Their current medications and supplements ?4. The patient's functional ability including ADL's, fall risks, home safety risks and hearing or visual ?            impairment. ?5. Diet and physical activities ?6. Evidence for depression or mood disorders ? ?The patients weight, height, BMI have been recorded in the chart and visual acuity is per eye clinic.  ?I have made referrals, counseling and provided education to the patient based review of the above and I have provided the pt with a written personalized care plan for preventive services. ? ?Provider list updated- see scanned forms.  Routine anticipatory guidance given to patient.  See health maintenance. The possibility exists that previously documented standard health maintenance information may have been brought forward from a previous encounter into this note.  If needed, that same information has been updated to reflect the current situation based on today's encounter.   ? ?Flu previously done ?Shingles previously done ?PNA previously done ?Tetanus previously done ?COVID-vaccine previously done ?Colon cancer screening 2019 with colonoscopy. ?Prostate cancer screening 2023 ?Advance directive-wife designated if patient were incapacitated. ?Cognitive function addressed- see scanned forms- and if abnormal then additional documentation follows.  ? ?In addition to [facility], follow up visit for the below conditions: ? ?Elevated Cholesterol: ?Using medications without problems: yes ?Muscle aches: no ?Diet compliance: yes ?Exercise: yes ? ?Hypertension:    ?Using medication without problems or lightheadedness:  yes ?Chest pain with exertion:no ?Edema: some mild L>R LE edema but not noted now.  ?Short of breath: no ?Recently labs d/w pt.   ? ?He only uses flonse and flovent  using allergy season.  No SABA use.   Med list updated.   ? ?He has routine periodic vascular follow-up at [facility].  No chest pain. ? ?PMH and SH reviewed ? ?Meds, vitals, and allergies reviewed.  ? ?ROS: Per HPI.  Unless specifically indicated otherwise in HPI, the patient denies: ? ?General: fever. ?Eyes: acute vision changes ?ENT: sore throat ?Cardiovascular: chest pain ?Respiratory: SOB ?GI: vomiting ?GU: dysuria ?Musculoskeletal: acute back pain ?Derm: acute rash ?Neuro: acute motor dysfunction ?Psych: worsening mood ?Endocrine: polydipsia ?Heme: bleeding ?Allergy: hayfever ? ?GEN: nad, alert and oriented ?HEENT: ncat ?NECK: supple w/o LA ?CV: rrr. ?PULM: ctab, no inc wob ?ABD: soft, +bs ?EXT: no edema ?SKIN: Well-perfused. ?

## 2021-11-08 NOTE — Assessment & Plan Note (Signed)
He only uses flonse and flovent using allergy season.  No SABA use.   Med list updated.   Continue as is with Flonase and Flovent. ?

## 2021-11-08 NOTE — Assessment & Plan Note (Signed)
Continue atorvastatin

## 2021-11-08 NOTE — Assessment & Plan Note (Signed)
Flu previously done ?Shingles previously done ?PNA previously done ?Tetanus previously done ?COVID-vaccine previously done ?Colon cancer screening 2019 with colonoscopy. ?Prostate cancer screening 2023 ?Advance directive-wife designated if patient were incapacitated. ?Cognitive function addressed- see scanned forms- and if abnormal then additional documentation follows.  ?

## 2021-11-08 NOTE — Assessment & Plan Note (Signed)
He has routine periodic vascular follow-up at [facility].  No chest pain. ?

## 2021-11-08 NOTE — Assessment & Plan Note (Signed)
Continue amlodipine and hydrochlorothiazide.  Continue nebivolol. ?

## 2021-11-08 NOTE — Assessment & Plan Note (Signed)
Advance directive- wife designated if patient were incapacitated.  

## 2021-11-20 ENCOUNTER — Other Ambulatory Visit: Payer: Self-pay | Admitting: Family Medicine

## 2022-03-13 ENCOUNTER — Other Ambulatory Visit: Payer: Self-pay | Admitting: Family Medicine

## 2022-04-20 ENCOUNTER — Ambulatory Visit: Payer: Medicare Other | Admitting: Family Medicine

## 2022-04-20 ENCOUNTER — Encounter: Payer: Self-pay | Admitting: Family Medicine

## 2022-04-20 VITALS — BP 110/60 | HR 64 | Temp 97.5°F | Ht 72.0 in | Wt 165.2 lb

## 2022-04-20 DIAGNOSIS — R413 Other amnesia: Secondary | ICD-10-CM

## 2022-04-20 LAB — COMPREHENSIVE METABOLIC PANEL
ALT: 24 U/L (ref 0–53)
AST: 23 U/L (ref 0–37)
Albumin: 4.7 g/dL (ref 3.5–5.2)
Alkaline Phosphatase: 63 U/L (ref 39–117)
BUN: 17 mg/dL (ref 6–23)
CO2: 31 mEq/L (ref 19–32)
Calcium: 9.9 mg/dL (ref 8.4–10.5)
Chloride: 103 mEq/L (ref 96–112)
Creatinine, Ser: 0.98 mg/dL (ref 0.40–1.50)
GFR: 73.09 mL/min (ref >60.00)
Glucose, Bld: 98 mg/dL (ref 70–99)
Potassium: 3.7 mEq/L (ref 3.5–5.1)
Sodium: 143 mEq/L (ref 135–145)
Total Bilirubin: 0.7 mg/dL (ref 0.2–1.2)
Total Protein: 7.9 g/dL (ref 6.0–8.3)

## 2022-04-20 LAB — CBC WITH DIFFERENTIAL/PLATELET
Basophils Absolute: 0 10*3/uL (ref 0.0–0.1)
Basophils Relative: 0.6 % (ref 0.0–3.0)
Eosinophils Absolute: 0.3 10*3/uL (ref 0.0–0.7)
Eosinophils Relative: 3.4 % (ref 0.0–5.0)
HCT: 46.2 % (ref 39.0–52.0)
Hemoglobin: 15.3 g/dL (ref 13.0–17.0)
Lymphocytes Relative: 22.1 % (ref 12.0–46.0)
Lymphs Abs: 1.7 10*3/uL (ref 0.7–4.0)
MCHC: 33.1 g/dL (ref 30.0–36.0)
MCV: 89.7 fl (ref 78.0–100.0)
Monocytes Absolute: 0.8 10*3/uL (ref 0.1–1.0)
Monocytes Relative: 10.5 % (ref 3.0–12.0)
Neutro Abs: 4.8 10*3/uL (ref 1.4–7.7)
Neutrophils Relative %: 63.4 % (ref 43.0–77.0)
Platelets: 134 10*3/uL — ABNORMAL LOW (ref 150.0–400.0)
RBC: 5.15 Mil/uL (ref 4.22–5.81)
RDW: 14.2 % (ref 11.5–15.5)
WBC: 7.5 10*3/uL (ref 4.0–10.5)

## 2022-04-20 LAB — TSH: TSH: 2.17 u[IU]/mL (ref 0.35–5.50)

## 2022-04-20 LAB — VITAMIN B12: Vitamin B-12: 355 pg/mL (ref 211–911)

## 2022-04-20 NOTE — Patient Instructions (Signed)
Go to the lab on the way out.   If you have mychart we'll likely use that to update you.    We'll call about the head scan.  If you don't hear about that by early next week, then let me know.   Don't change your meds for now.  Take care.  Glad to see you.

## 2022-04-20 NOTE — Progress Notes (Unsigned)
Memory changes d/w pt.  He had an episode where he forgot directions to a location.  Episodic sx.  Some days are worse than others.  He forgot making an order for some coins.  His wife has noted changes.  He has noted changes.  This is not a sudden change, but more of a gradual issue that is happened episodically at least over the past few months.  Meds, vitals, and allergies reviewed.   ROS: Per HPI unless specifically indicated in ROS section   GEN: nad, alert and oriented HEENT: ncat NECK: supple w/o LA CV: rrr.   PULM: ctab, no inc wob ABD: soft, +bs EXT: no edema SKIN: Well-perfused. CN 2-12 wnl B, S/S wnl x4  MMSE 26 out of 30 with one-point lost for pentagon copying and 3 points lost for recall.  His pentagon drawing was borderline so giving him credit for that he would still only scored 27 out of 30.  Discussed.  35 minutes were devoted to patient care in this encounter (this includes time spent reviewing the patient's file/history, interviewing and examining the patient, counseling/reviewing plan with patient).

## 2022-04-22 DIAGNOSIS — R413 Other amnesia: Secondary | ICD-10-CM | POA: Insufficient documentation

## 2022-04-22 NOTE — Assessment & Plan Note (Signed)
MMSE 26 out of 30 with one-point lost for pentagon copying and 3 points lost for recall.  His pentagon drawing was borderline so giving him credit for that he would still only scored 27 out of 30.  Discussed.  Of unclear source.  Check basic labs and head CT.  See after visit summary.  We will look for reversible causes.  Broad differential discussed with patient and wife.  Okay for outpatient follow-up.  He agrees to plan.

## 2022-04-27 ENCOUNTER — Ambulatory Visit
Admission: RE | Admit: 2022-04-27 | Discharge: 2022-04-27 | Disposition: A | Discharge status: Home / Self Care | Payer: Medicare Other | Source: Ambulatory Visit | Attending: Family Medicine | Admitting: Family Medicine

## 2022-04-27 DIAGNOSIS — R413 Other amnesia: Secondary | ICD-10-CM | POA: Diagnosis present

## 2022-05-08 ENCOUNTER — Ambulatory Visit: Payer: Medicare Other | Admitting: Family Medicine

## 2022-05-08 ENCOUNTER — Encounter: Payer: Self-pay | Admitting: Family Medicine

## 2022-05-08 VITALS — BP 138/80 | HR 54 | Temp 97.3°F | Ht 72.0 in | Wt 165.0 lb

## 2022-05-08 DIAGNOSIS — Z8673 Personal history of transient ischemic attack (TIA), and cerebral infarction without residual deficits: Secondary | ICD-10-CM

## 2022-05-08 DIAGNOSIS — Z23 Encounter for immunization: Secondary | ICD-10-CM | POA: Diagnosis not present

## 2022-05-08 DIAGNOSIS — R413 Other amnesia: Secondary | ICD-10-CM

## 2022-05-08 MED ORDER — ASPIRIN 81 MG PO TBEC: 81.0000 mg | DELAYED_RELEASE_TABLET | Freq: Every day | ORAL | Status: AC

## 2022-05-08 NOTE — Progress Notes (Unsigned)
Discussed with patient about memory changes, especially short-term memory changes.  Discussed recent unremarkable lab evaluation with other changes noted on CT.  D/w pt about aspirin start.  He can tolerate '81mg'$ .    We talked about working up his situation from vascular standpoint with aspirin restart, carotid ultrasound, echocardiogram and also updating cardiology.  A separate issue would be following along with his memory, having him stay active and mentally stimulated, and consider seeing neurology.  Another option is also starting medication preemptively for his memory.  I think it makes sense to do all of the vascular considerations and consider the other items.  Discussed.  He can update me as needed.  Meds, vitals, and allergies reviewed.   ROS: Per HPI unless specifically indicated in ROS section   GEN: nad, alert and oriented HEENT: mucous membranes moist NECK: supple w/o LA CV: rrr.  PULM: ctab, no inc wob ABD: soft, +bs EXT: no edema SKIN: Well-perfused.  40 minutes were devoted to patient care in this encounter (this includes time spent reviewing the patient's file/history, interviewing and examining the patient, counseling/reviewing plan with patient).

## 2022-05-08 NOTE — Patient Instructions (Addendum)
Start taking aspirin '81mg'$ . Recheck memory at a visit in about 3 months. I'll work on getting the echo and ultrasound set up.  I'll update Dr. Rockey Situ.   We'll go form there.

## 2022-05-09 ENCOUNTER — Telehealth: Payer: Self-pay | Admitting: Family Medicine

## 2022-05-09 DIAGNOSIS — Z8673 Personal history of transient ischemic attack (TIA), and cerebral infarction without residual deficits: Secondary | ICD-10-CM | POA: Insufficient documentation

## 2022-05-09 NOTE — Telephone Encounter (Signed)
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  I am going to check echo and carotid ultrasound.  If you have other thoughts about additional imaging then please let me know.  He is going to restart 81 mg of aspirin and continue his statin. Many thanks.

## 2022-05-09 NOTE — Assessment & Plan Note (Signed)
We talked about working up his situation from vascular standpoint with aspirin restart, carotid ultrasound, echocardiogram and also updating cardiology.  A separate issue would be following along with his memory, having him stay active and mentally stimulated, and consider seeing neurology.  Another option is also starting medication preemptively for his memory.  I think it makes sense to do all of the vascular considerations and consider the other items.  Discussed.  He can update me as needed.

## 2022-05-09 NOTE — Assessment & Plan Note (Signed)
I presume that this contributes to his memory loss.  Discussed. Start taking aspirin '81mg'$ . Recheck memory at a visit in abut 3 months. I'll work on getting the echo and ultrasound set up.  I'll update Dr. Rockey Situ with cardiology. We'll go form there.

## 2022-05-12 NOTE — Telephone Encounter (Signed)
Please update patient. I talked with Dr. Rockey Situ and he is in agreement with the plan re: ordered imaging.  Thanks.

## 2022-05-14 NOTE — Telephone Encounter (Signed)
Called patient and reviewed all information. Patient verbalized understanding. Will call if any further questions.  

## 2022-05-14 NOTE — Telephone Encounter (Signed)
Patient called back in returning call he received.

## 2022-05-14 NOTE — Telephone Encounter (Signed)
Left message to return call to our office.  

## 2022-05-31 ENCOUNTER — Other Ambulatory Visit: Payer: Self-pay | Admitting: Cardiovascular Disease

## 2022-06-15 ENCOUNTER — Ambulatory Visit: Payer: Medicare Other | Attending: Family Medicine

## 2022-06-15 ENCOUNTER — Ambulatory Visit (INDEPENDENT_AMBULATORY_CARE_PROVIDER_SITE_OTHER): Payer: Medicare Other

## 2022-06-15 DIAGNOSIS — Z8673 Personal history of transient ischemic attack (TIA), and cerebral infarction without residual deficits: Secondary | ICD-10-CM | POA: Diagnosis not present

## 2022-06-15 DIAGNOSIS — I493 Ventricular premature depolarization: Secondary | ICD-10-CM | POA: Diagnosis not present

## 2022-06-15 DIAGNOSIS — I080 Rheumatic disorders of both mitral and aortic valves: Secondary | ICD-10-CM | POA: Diagnosis not present

## 2022-06-15 LAB — ECHOCARDIOGRAM COMPLETE
AR max vel: 2.9 cm2
AV Area VTI: 3.01 cm2
AV Area mean vel: 2.75 cm2
AV Mean grad: 2 mmHg
AV Peak grad: 4.5 mmHg
Ao pk vel: 1.06 m/s
Area-P 1/2: 2.47 cm2
Calc EF: 52.7 %
S' Lateral: 3.8 cm
Single Plane A2C EF: 48.6 %
Single Plane A4C EF: 55.4 %

## 2022-06-18 NOTE — Progress Notes (Signed)
Cardiology Office Note  Date:  06/19/2022   ID:  Jann, Ra [id number], MRN [id number]  PCP:  Tonia Ghent, MD   Chief Complaint  Patient presents with   Other    Follow up carotid u/s & Echo results. Patient denies chest pain and SOB. Meds reviewed verbally with patient.     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  Post-op course complicated by persistent HTN and atrial flutter treated with amio (stopped in January 2011).   Followed by cardiothoracic surgery at [facility] on an annual basis with CT scan surveillance. He presents for routine followup of his of his hypertension And aortic dissection  Last seen by myself in clinic September 2022 In follow-up today reports that he feels well Concerned about mild memory loss Active daily, wife takes him walking on a routine basis Slower than he used to be  Reports blood pressure well controlled No chest pain, no shortness of breath, no PND orthopnea  Recent imaging reviewed Echocardiogram November 2023 Normal ejection fraction, normal RV size and function No significant valvular heart disease  Carotid ultrasound Patent vessels including graft  Scheduled to see [facility] for repeat CT scan February 2024  EKG personally reviewed by myself on todays visit Normal sinus rhythm rate 72 bpm nonspecific T wave abnormality, PVC   [facility] CT at [facility]: Reviewed with him 1. Stable endovascular repair of the thoracic aorta and debranching  procedure with no significant change in appearance of the thoracic and  abdominal aorta. The debranching graft supplying the vessels and left  common carotid to left subclavian artery bypass graft  are patent.   2. Below the level of the aortic stent is a residual aortic dissection  which terminates in the bilateral iliac arteries; the extent of the  dissection and size of aorta has not significantly changed.   3. 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   4. Left vertebral artery at the thoracocervical spine is no longer  opacified, raising the concern of high grade stenosis.   Covid  aug 2022,   Other past medical history reviewed chronic dizziness Take meclizine as needed  Lab Results  Component Value Date   CHOL 126 09/22/2021   HDL 37.20 (L) 09/22/2021   LDLCALC 57 09/22/2021   TRIG 155.0 (H) 09/22/2021    Echo MILD LV DYSFUNCTION (See above) WITH MILD LVH, 50%   NORMAL LA PRESSURES WITH DIASTOLIC DYSFUNCTION   NORMAL RIGHT VENTRICULAR SYSTOLIC FUNCTION   VALVULAR REGURGITATION: MILD AR, TRIVIAL MR, TRIVIAL PR, TRIVIAL TR   NO VALVULAR STENOSIS  In hospital 08/21/2016 to 1/15 Had flu and PNA 08/2016 CT 08/26/2016 at [facility]   Prior ECHO showed EF 45-50%. Had cardiac CT in 12/10 after abnormal myvoiew. Coronaries normal except for 25% RCA. 72m stable pulmonary nodule.    echo (02/2010) EF 55-60% with grade 1 diastolic dysfx and previous AO dissection flap.     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[place])   CARDIAC SURGERY  2013   at [facility]- transverse aortic arch/descending aneurysm repair s/p debranching procedure  (total of 5 Gore C-TAG devices extending from distal descending aorta to his prev placed ascending dacron graft)   COLONOSCOPY     Flex- Polyp   06/1999   at 38cm, Int/Ext Hemm divertics [facility])   Hemi Arch Replacement  03/2009 and repeat 10/2011   and AoV resuspension w/ Repl of Ascending Aorta and Hemiarch w/ Graft; Pericardial Tamponade Aortic Regurg 8/22-8/28/10;  endovascular repair of aortic dilation 2013 with  ascending aorta to L common carotid, R subclavian, R common carotid and L carotid subclavian bypass   [facility]  08/22-8/28/2010   Thoracic aortic dissection pericardial teamponade aortic regurg   INGUINAL HERNIA REPAIR Right 12/24/2017   Procedure: HERNIA REPAIR INGUINAL ADULT;  Surgeon: Greer Pickerel, MD;  Location: [facility];  Service: General;  Laterality: Right;   INSERTION OF MESH Right 12/24/2017   Procedure: INSERTION OF MESH;  Surgeon: Greer Pickerel, MD;  Location: [facility];  Service: General;  Laterality: Right;   melanoma removed from scalp  06/2006   [place]   POLYPECTOMY     PROSTATECTOMY  12/1993   Dr. Risa Grill   WISDOM TOOTH EXTRACTION  10/2005    Current Outpatient Medications  Medication Sig Dispense Refill   amLODipine (NORVASC) 5 MG tablet TAKE 1 TABLET BY MOUTH ONCE A DAY 30 tablet 0   aspirin EC 81 MG tablet Take 1 tablet (81 mg total) by mouth daily. Swallow whole.     atorvastatin (LIPITOR) 20 MG tablet Take 1 tablet (20 mg total) by mouth at bedtime. 90 tablet 4   FLOVENT HFA 110 MCG/ACT inhaler INHALE 1 PUFF INTO THE LUNGS TWICE DAILY*RINSE MOUTH AFTER USE* 12 g 2   fluticasone (FLONASE) 50 MCG/ACT nasal spray USE 1 SPRAY IN EACH NOSTRIL ONCE DAILY AS  NEEDED FOR CONGESTION 16 g 3   hydrochlorothiazide (HYDRODIURIL) 25 MG tablet Take 1 tablet (25 mg total) by mouth every other day. 90 tablet 4   Lutein-Zeaxanthin 25-5 MG CAPS Take 1 capsule by mouth daily.     meclizine (ANTIVERT) 25 MG tablet Take 1 tablet (25 mg total) by mouth 3 (three) times daily as needed for dizziness.     Nebivolol HCl 20 MG TABS Take 1 tablet (20 mg total) by mouth daily. 90 tablet 4   promethazine (PHENERGAN) 25 MG tablet TAKE 1 TABLET BY MOUTH EVERY 6 HOURS AS NEEDED FOR NAUSEA OR VERTIGO 30 tablet 3   No current facility-administered medications for this visit.     Allergies:   Amlodipine and Lactose intolerance (gi)   Social History:  The patient  reports that he quit smoking about 27 years ago. His smoking use included cigarettes. He has a 25.00 pack-year smoking history. He has never used smokeless tobacco. He reports current alcohol use. He reports that he does not use drugs.   Family History:   family  history includes Colon polyps in his sister; Diabetes in some other family members; Heart disease in his father and mother; Heart failure in his mother; Hyperlipidemia in his daughter; Hypertension in his mother; Kidney disease in his father; Kidney failure in his father; Prostate cancer in his father; Stroke in his father.    Review of Systems: Review of Systems  Constitutional: Negative.   HENT: Negative.    Cardiovascular: Negative.   Gastrointestinal: Negative.   Musculoskeletal: Negative.   Neurological: Negative.   Psychiatric/Behavioral: Negative.    All other systems reviewed and are negative.   PHYSICAL EXAM: VS:  BP 120/60 (BP Location: Left Arm, Patient Position: Sitting, Cuff Size: Normal)   Pulse 72   Ht 6' (1.829 m)   Wt 164 lb (74.4 kg)   SpO2 95%   BMI 22.24 kg/m  , BMI Body mass index is 22.24 kg/m. Constitutional:  oriented to person, place, and time. No distress.  HENT:  Head: Grossly normal Eyes:  no discharge. No scleral  icterus.  Neck: No JVD, no carotid bruits  Cardiovascular: Regular rate and rhythm, no murmurs appreciated Pulmonary/Chest: Clear to auscultation bilaterally, no wheezes or rails Abdominal: Soft.  no distension.  no tenderness.  Musculoskeletal: Normal range of motion Neurological:  normal muscle tone. Coordination normal. No atrophy Skin: Skin warm and dry Psychiatric: normal affect, pleasant  Recent Labs: 04/20/2022: ALT 24; BUN 17; Creatinine, Ser 0.98; Hemoglobin 15.3; Platelets 134.0; Potassium 3.7; Sodium 143; TSH 2.17    Lipid Panel Lab Results  Component Value Date   CHOL 126 09/22/2021   HDL 37.20 (L) 09/22/2021   LDLCALC 57 09/22/2021   TRIG 155.0 (H) 09/22/2021      Wt Readings from Last 3 Encounters:  06/19/22 164 lb (74.4 kg)  05/08/22 165 lb (74.8 kg)  04/20/22 165 lb 4 oz (75 kg)    ASSESSMENT AND PLAN:  Essential hypertension -  Blood pressure is well controlled on today's visit. No changes made to the medications.  Coronary artery disease involving native coronary artery of native heart without angina pectoris Currently with no symptoms of angina. No further workup at this time. Continue current medication regimen.  Frequent PVCs Asymptomatic, noted on EKG today, on beta-blocker No medication changes made  Dissection of thoracic aorta [facility]), H/O ascending aorta repair Followed at [facility], scan every 24 months Scheduled February 2024 Recent echo and carotid ultrasound stable   Total encounter time more than 30 minutes  Greater than 50% was spent in counseling and coordination of care with the patient    No orders of the defined types were placed in this encounter.    Signed, Esmond Plants, M.D., Ph.D. 06/19/2022  [place], [place]

## 2022-06-19 ENCOUNTER — Other Ambulatory Visit: Payer: Self-pay | Admitting: Cardiovascular Disease

## 2022-06-19 ENCOUNTER — Ambulatory Visit: Payer: Medicare Other | Attending: Cardiovascular Disease | Admitting: Cardiovascular Disease

## 2022-06-19 ENCOUNTER — Encounter: Payer: Self-pay | Admitting: Cardiovascular Disease

## 2022-06-19 VITALS — BP 120/60 | HR 72 | Ht 72.0 in | Wt 164.0 lb

## 2022-06-19 DIAGNOSIS — I25118 Atherosclerotic heart disease of native coronary artery with other forms of angina pectoris: Secondary | ICD-10-CM

## 2022-06-19 DIAGNOSIS — I10 Essential (primary) hypertension: Secondary | ICD-10-CM

## 2022-06-19 DIAGNOSIS — I4892 Unspecified atrial flutter: Secondary | ICD-10-CM

## 2022-06-19 DIAGNOSIS — I359 Nonrheumatic aortic valve disorder, unspecified: Secondary | ICD-10-CM

## 2022-06-19 DIAGNOSIS — E785 Hyperlipidemia, unspecified: Secondary | ICD-10-CM

## 2022-06-19 DIAGNOSIS — I493 Ventricular premature depolarization: Secondary | ICD-10-CM

## 2022-06-19 DIAGNOSIS — Z9889 Other specified postprocedural states: Secondary | ICD-10-CM | POA: Diagnosis not present

## 2022-06-19 MED ORDER — NEBIVOLOL HCL 20 MG PO TABS: 1.0000 | ORAL_TABLET | Freq: Every day | ORAL | 3 refills | Status: DC

## 2022-06-19 NOTE — Telephone Encounter (Signed)
Patient scheduled to see Dr. Rockey Situ this afternoon. Can you please refill if continued? Thank you!!

## 2022-06-19 NOTE — Patient Instructions (Addendum)
Medication Instructions:  No changes  If you need a refill on your cardiac medications before your next appointment, please call your pharmacy.   Lab work: No new labs needed  Testing/Procedures: No new testing needed  Follow-Up: At [facility], you and your health needs are our priority.  As part of our continuing mission to provide you with exceptional heart care, we have created designated Provider Care Teams.  These Care Teams include your primary Cardiologist (physician) and Advanced Practice Providers (APPs -  Physician Assistants and Nurse Practitioners) who all work together to provide you with the care you need, when you need it.  You will need a follow up appointment in 12 months  Providers on your designated Care Team:   Christopher Berge, NP Ryan Dunn, PA-C Cadence Furth, PA-C  COVID-19 Vaccine Information can be found at: [email or web address] For questions related to vaccine distribution or appointments, please email [email or web address] or call [phone].   

## 2022-06-20 NOTE — Addendum Note (Signed)
Addended by: Michel Santee on: 06/20/2022 11:04 AM   Modules accepted: Orders

## 2022-06-23 ENCOUNTER — Other Ambulatory Visit: Payer: Self-pay | Admitting: Cardiovascular Disease

## 2022-07-02 ENCOUNTER — Other Ambulatory Visit: Payer: Self-pay | Admitting: Cardiovascular Disease

## 2022-07-16 ENCOUNTER — Other Ambulatory Visit: Payer: Self-pay | Admitting: Cardiovascular Disease

## 2022-07-27 ENCOUNTER — Other Ambulatory Visit: Payer: Self-pay | Admitting: Family Medicine

## 2022-08-09 ENCOUNTER — Ambulatory Visit: Payer: Medicare Other | Admitting: Family Medicine

## 2022-08-17 ENCOUNTER — Ambulatory Visit: Payer: Medicare Other | Admitting: Family Medicine

## 2022-08-17 ENCOUNTER — Encounter: Payer: Self-pay | Admitting: Family Medicine

## 2022-08-17 VITALS — BP 130/86 | HR 70 | Temp 97.2°F | Ht 72.0 in | Wt 166.0 lb

## 2022-08-17 DIAGNOSIS — R413 Other amnesia: Secondary | ICD-10-CM | POA: Diagnosis not present

## 2022-08-17 NOTE — Progress Notes (Unsigned)
D/w pt about prev echo and carotid study and cards OV.   He noted sx since covid in 03/2021.    MMSE 27/30.    Some days are worse than others, no a specific time of every day.      Recheck memory periodically.  Offer neuro eval Treat anxiety.  Treatment for memory as a primary issue (trial of donepezil)

## 2022-08-17 NOTE — Patient Instructions (Addendum)
4 options discussed  Recheck memory periodically.  Seeing neurology Treatment anxiety.  Treatment for memory as a primary issue (trial of donepezil).  Please schedule recheck memory in about 3-4 months.  Take care.  Glad to see you.

## 2022-08-18 NOTE — Assessment & Plan Note (Signed)
  MMSE 27/30 today.  -1 for orientation and -2 for recall.  Differential discussed, including age-related memory changes, primary memory loss, vascular disease. We talked about 4 options going forward. Recheck memory periodically.  Offer neuro eval Treat anxiety related to memory changes. Treatment for memory as a primary issue (trial of donepezil)  I asked him to consider the 4 options let me know which way he wanted to go.  At minimum I think it makes sense to recheck his memory here in the clinic in about 3-4 months.  If he makes a decision about any of the other items in the meantime he can let me know.  Patient and wife agree.  Fortunately his MMSE is stable and he does not have red flag symptoms.

## 2022-09-10 ENCOUNTER — Encounter: Payer: Self-pay | Admitting: Family Medicine

## 2022-09-14 ENCOUNTER — Other Ambulatory Visit: Payer: Self-pay | Admitting: Family Medicine

## 2022-09-14 MED ORDER — DONEPEZIL HCL 5 MG PO TABS: 5.0000 mg | ORAL_TABLET | Freq: Every day | ORAL | 0 refills | Status: DC

## 2022-09-15 ENCOUNTER — Encounter: Payer: Self-pay | Admitting: Family Medicine

## 2022-10-02 ENCOUNTER — Other Ambulatory Visit: Payer: Self-pay | Admitting: Cardiovascular Disease

## 2022-10-15 ENCOUNTER — Telehealth: Payer: Self-pay | Admitting: Family Medicine

## 2022-10-15 NOTE — Telephone Encounter (Signed)
Pt's wife, Pamala Hurry, called stating the pt has been having diarrhea & upset stomach since starting donepezil (ARICEPT) 5 MG tablet.

## 2022-10-16 NOTE — Addendum Note (Signed)
Addended by: Tonia Ghent on: 10/16/2022 06:59 AM   Modules accepted: Orders

## 2022-10-16 NOTE — Telephone Encounter (Signed)
Patient's wife has been advised to stop aricept and to let us know in a week about sx

## 2022-10-16 NOTE — Telephone Encounter (Signed)
Would stop med.  Please update me in about 1 week- I want to know if symptoms persist or have resolved at that point. Thanks.

## 2022-10-29 ENCOUNTER — Other Ambulatory Visit: Payer: Self-pay | Admitting: Cardiovascular Disease

## 2022-10-29 ENCOUNTER — Encounter: Payer: Self-pay | Admitting: Urology

## 2022-10-29 ENCOUNTER — Ambulatory Visit: Payer: Medicare Other | Admitting: Urology

## 2022-10-29 DIAGNOSIS — Z8546 Personal history of malignant neoplasm of prostate: Secondary | ICD-10-CM

## 2022-10-29 DIAGNOSIS — Q6102 Congenital multiple renal cysts: Secondary | ICD-10-CM

## 2022-10-29 DIAGNOSIS — N281 Cyst of kidney, acquired: Secondary | ICD-10-CM

## 2022-10-29 NOTE — Progress Notes (Signed)
10/29/2022 1:37 PM   Zamari Lavena Bullion 12-16-1940 [id number]  Referring provider: Tonia Ghent, MD [address] [place],  [place] [address]  Chief Complaint  Patient presents with   Follow-up    Urologic history: 1.  Renal cysts -2.3 x 2 cm hyperdense midpole right renal cyst, Bosniak 2 -Exophytic simple left renal cyst -4.6 x 4.6 right upper pole simple renal cyst -Cluster large right lower pole renal cysts with peripheral calcification and internal septation; no evidence of enhancement; stable since 2010; Bosniak 2 -Small hypodense lesions bilaterally statistically consistent with simple renal cysts -Diuretic renogram performed on 10/12/2020 showed differential renal function at 55% on the left and 45% on the right.  The right renal collecting system was dilated however good washout following Lasix administration was noted and there was no evidence of obstruction  2.  Nephrolithiasis -Nonobstructing right renal calculi  3.  Prostate cancer -Open radical retropubic prostatectomy 1995  4.  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 male presents for 2 year follow-up.  Doing well since last visit Worsening post prostatectomy incontinence since his last visit.  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 Carotid u/s 2/11 normal.   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   Pneumonia    2018   PONV (postoperative nausea and vomiting)    Prostate cancer ([place])    1995   Pulmonary nodule    Stable on CT 2/11   Skin cancer    scalp 2007  melanoma    Surgical History: Past Surgical History:  Procedure Laterality Date   ABDOMINAL AORTIC ANEURYSM REPAIR  march 2013   Axillary Mass removal  05/2000   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[place])   CARDIAC SURGERY  2013   at [facility]- transverse aortic arch/descending aneurysm repair s/p debranching procedure  (total of 5 Gore C-TAG devices extending from distal descending aorta to his prev placed ascending dacron graft)   COLONOSCOPY     Flex- Polyp   06/1999   at 38cm, Int/Ext Hemm divertics [facility] Plan)   Hemi Arch Replacement  03/2009 and repeat 10/2011   and AoV resuspension w/ Repl of Ascending Aorta and Hemiarch w/ Graft; Pericardial Tamponade Aortic Regurg 8/22-8/28/10;  endovascular repair of aortic dilation 2013 with  ascending aorta to L common carotid, R subclavian, R common carotid and L carotid subclavian bypass   [facility]  08/22-8/28/2010   Thoracic aortic dissection pericardial teamponade aortic regurg   INGUINAL HERNIA REPAIR Right 12/24/2017   Procedure: HERNIA REPAIR INGUINAL ADULT;  Surgeon:  Greer Pickerel, MD;  Location: [facility];  Service: General;  Laterality: Right;   INSERTION OF MESH Right 12/24/2017   Procedure: INSERTION OF MESH;  Surgeon: Greer Pickerel, MD;  Location: [facility];  Service: General;  Laterality: Right;   melanoma removed from scalp  06/2006   [place]     PROSTATECTOMY  12/1993   Dr. Risa Grill   WISDOM TOOTH EXTRACTION  10/2005    Home Medications:  Allergies as of 10/29/2022       Reactions   Amlodipine Other (See Comments)   Edema at  10mg  a day   Donepezil    GI upset   Lactose Intolerance (gi) Diarrhea, Other (See Comments)   Bloating, gas   Quinolones    Avoid if possible due to h/o aortic dissection        Medication List        Accurate as of October 29, 2022  1:37 PM. If you have any questions, ask your nurse or doctor.          amLODipine 5 MG tablet Commonly known as: NORVASC TAKE ONE TABLET BY MOUTH ONCE A DAY What changed:  how much to take how to take this when to take this additional instructions Changed by: Ida Rogue, MD   aspirin EC 81 MG tablet Take 1 tablet (81 mg total) by mouth daily. Swallow whole.   atorvastatin 20 MG tablet Commonly known as: LIPITOR TAKE 1 TABLET BY MOUTH EVERY NIGHT AT BEDTIME   donepezil 5 MG tablet Commonly known as: ARICEPT Take 5 mg by mouth at bedtime.   Flovent HFA 110 MCG/ACT inhaler Generic drug: fluticasone INHALE 1 PUFF INTO THE LUNGS TWICE DAILY*RINSE MOUTH AFTER USE*   fluticasone 50 MCG/ACT nasal spray Commonly known as: FLONASE USE 1 SPRAY IN EACH NOSTRIL ONCE DAILY AS NEEDED FOR CONGESTION   hydrochlorothiazide 25 MG tablet Commonly known as: HYDRODIURIL TAKE 1 TABLET BY MOUTH EVERY OTHER DAY   Lutein-Zeaxanthin 25-5 MG Caps Take 1 capsule by mouth daily.   meclizine 25 MG tablet Commonly known as: ANTIVERT Take 1 tablet (25 mg total) by mouth 3 (three) times daily as needed for dizziness.   Nebivolol HCl 20 MG Tabs Take 1 tablet (20 mg total) by mouth daily.   promethazine 25 MG tablet Commonly known as: PHENERGAN TAKE 1 TABLET BY MOUTH EVERY 6 HOURS AS NEEDED FOR NAUSEA OR VERTIGO        Allergies:  Allergies  Allergen Reactions   Amlodipine Other (See Comments)    Edema at 10mg  a day   Donepezil     GI upset   Lactose Intolerance (Gi) Diarrhea and Other (See Comments)    Bloating, gas   Quinolones     Avoid if possible due to h/o aortic dissection    Family History: Family History  Problem Relation  Age of Onset   Heart failure Mother        died age 38   Hypertension Mother    Heart disease Mother        CHF   Kidney failure Father    Heart disease Father        coronary disease, CABG   Stroke Father        due to "infection" from [place]   Kidney disease Father        chronic   Prostate cancer Father    Diabetes Other    Colon polyps  Sister    Diabetes Other    Hyperlipidemia Daughter    Colon cancer Neg Hx    Esophageal cancer Neg Hx    Stomach cancer Neg Hx    Rectal cancer Neg Hx     Social History:  reports that he quit smoking about 28 years ago. His smoking use included cigarettes. He has a 25.00 pack-year smoking history. He has never used smokeless tobacco. He reports current alcohol use. He reports that he does not use drugs.   Physical Exam: Constitutional:  Alert and oriented, No acute distress. HEENT: [place] AT, moist mucus membranes.  Trachea midline, no masses. Cardiovascular: No clubbing, cyanosis, or edema. Respiratory: Normal respiratory effort, no increased work of breathing.   Assessment & Plan:    1.  Bilateral renal cysts Stable Bosniak 1/Bosniak 2 renal cysts He is continuing to undergo every 2 year imaging at [facility] and will return as needed for any change in the cysts  2.  History of prostate cancer Status post radical prostatectomy [place], MD  [facility] [address], [place], [place] [address] [phone]

## 2022-11-02 ENCOUNTER — Other Ambulatory Visit: Payer: Self-pay | Admitting: Family Medicine

## 2022-11-02 DIAGNOSIS — R413 Other amnesia: Secondary | ICD-10-CM

## 2022-11-02 DIAGNOSIS — I10 Essential (primary) hypertension: Secondary | ICD-10-CM

## 2022-11-02 DIAGNOSIS — Z125 Encounter for screening for malignant neoplasm of prostate: Secondary | ICD-10-CM

## 2022-11-06 ENCOUNTER — Other Ambulatory Visit (INDEPENDENT_AMBULATORY_CARE_PROVIDER_SITE_OTHER): Payer: Medicare Other

## 2022-11-06 DIAGNOSIS — R413 Other amnesia: Secondary | ICD-10-CM | POA: Diagnosis not present

## 2022-11-06 DIAGNOSIS — I10 Essential (primary) hypertension: Secondary | ICD-10-CM

## 2022-11-06 DIAGNOSIS — Z125 Encounter for screening for malignant neoplasm of prostate: Secondary | ICD-10-CM | POA: Diagnosis not present

## 2022-11-06 LAB — CBC WITH DIFFERENTIAL/PLATELET
Basophils Absolute: 0 10*3/uL (ref 0.0–0.1)
Basophils Relative: 0.4 % (ref 0.0–3.0)
Eosinophils Absolute: 0.4 10*3/uL (ref 0.0–0.7)
Eosinophils Relative: 4.5 % (ref 0.0–5.0)
HCT: 47.7 % (ref 39.0–52.0)
Hemoglobin: 15.8 g/dL (ref 13.0–17.0)
Lymphocytes Relative: 28.6 % (ref 12.0–46.0)
Lymphs Abs: 2.3 10*3/uL (ref 0.7–4.0)
MCHC: 33 g/dL (ref 30.0–36.0)
MCV: 90.1 fl (ref 78.0–100.0)
Monocytes Absolute: 0.8 10*3/uL (ref 0.1–1.0)
Monocytes Relative: 10.5 % (ref 3.0–12.0)
Neutro Abs: 4.5 10*3/uL (ref 1.4–7.7)
Neutrophils Relative %: 56 % (ref 43.0–77.0)
Platelets: 179 10*3/uL (ref 150.0–400.0)
RBC: 5.3 Mil/uL (ref 4.22–5.81)
RDW: 14 % (ref 11.5–15.5)
WBC: 8 10*3/uL (ref 4.0–10.5)

## 2022-11-06 LAB — COMPREHENSIVE METABOLIC PANEL
ALT: 19 U/L (ref 0–53)
AST: 23 U/L (ref 0–37)
Albumin: 4.6 g/dL (ref 3.5–5.2)
Alkaline Phosphatase: 70 U/L (ref 39–117)
BUN: 22 mg/dL (ref 6–23)
CO2: 32 mEq/L (ref 19–32)
Calcium: 9.6 mg/dL (ref 8.4–10.5)
Chloride: 101 mEq/L (ref 96–112)
Creatinine, Ser: 1.04 mg/dL (ref 0.40–1.50)
GFR: 67.8 mL/min (ref >60.00)
Glucose, Bld: 92 mg/dL (ref 70–99)
Potassium: 3.9 mEq/L (ref 3.5–5.1)
Sodium: 142 mEq/L (ref 135–145)
Total Bilirubin: 0.9 mg/dL (ref 0.2–1.2)
Total Protein: 7.8 g/dL (ref 6.0–8.3)

## 2022-11-06 LAB — LIPID PANEL
Cholesterol: 139 mg/dL (ref 0–200)
HDL: 43.6 mg/dL (ref >39.00)
LDL Cholesterol: 69 mg/dL (ref 0–99)
NonHDL: 95.51
Total CHOL/HDL Ratio: 3
Triglycerides: 133 mg/dL (ref 0.0–149.0)
VLDL: 26.6 mg/dL (ref 0.0–40.0)

## 2022-11-06 LAB — TSH: TSH: 3.01 u[IU]/mL (ref 0.35–5.50)

## 2022-11-06 LAB — PSA, MEDICARE: PSA: 0 ng/ml — ABNORMAL LOW (ref 0.10–4.00)

## 2022-11-12 ENCOUNTER — Encounter: Payer: Self-pay | Admitting: Family Medicine

## 2022-11-12 ENCOUNTER — Ambulatory Visit (INDEPENDENT_AMBULATORY_CARE_PROVIDER_SITE_OTHER): Payer: Medicare Other | Admitting: Family Medicine

## 2022-11-12 VITALS — BP 132/70 | HR 65 | Temp 97.7°F | Ht 72.0 in | Wt 164.0 lb

## 2022-11-12 DIAGNOSIS — Z0000 Encounter for general adult medical examination without abnormal findings: Secondary | ICD-10-CM | POA: Diagnosis not present

## 2022-11-12 DIAGNOSIS — Z9889 Other specified postprocedural states: Secondary | ICD-10-CM

## 2022-11-12 DIAGNOSIS — Z7189 Other specified counseling: Secondary | ICD-10-CM

## 2022-11-12 DIAGNOSIS — R413 Other amnesia: Secondary | ICD-10-CM | POA: Diagnosis not present

## 2022-11-12 DIAGNOSIS — E785 Hyperlipidemia, unspecified: Secondary | ICD-10-CM

## 2022-11-12 DIAGNOSIS — I10 Essential (primary) hypertension: Secondary | ICD-10-CM | POA: Diagnosis not present

## 2022-11-12 MED ORDER — MEMANTINE HCL 5 MG PO TABS: ORAL_TABLET | ORAL | 2 refills | Status: DC

## 2022-11-12 NOTE — Patient Instructions (Addendum)
RSV vaccine is available at the pharmacy.    Reasonable to start memantine 5mg  a day for your memory.  If tolerated after 1 week, then try increasing to twice a day.    If any troubles then stop it and let me know.   Take care.  Glad to see you.  Plan on recheck in about 6 months.  I would like to recheck your memory at the visit.

## 2022-11-12 NOTE — Progress Notes (Unsigned)
I have personally reviewed the Medicare Annual Wellness questionnaire and have noted 1. The patient's medical and social history 2. Their use of alcohol, tobacco or illicit drugs 3. Their current medications and supplements 4. The patient's functional ability including ADL's, fall risks, home safety risks and hearing or visual             impairment. 5. Diet and physical activities 6. Evidence for depression or mood disorders  The patients weight, height, BMI have been recorded in the chart and visual acuity is per eye clinic.  I have made referrals, counseling and provided education to the patient based review of the above and I have provided the pt with a written personalized care plan for preventive services.  Provider list updated- see scanned forms.  Routine anticipatory guidance given to patient.  See health maintenance. The possibility exists that previously documented standard health maintenance information may have been brought forward from a previous encounter into this note.  If needed, that same information has been updated to reflect the current situation based on today's encounter.    Flu Shingles PNA Tetanus Colon  Breast cancer screening Prostate cancer screening Advance directive Cognitive function addressed- see scanned forms- and if abnormal then additional documentation follows.   In addition to [facility] Wellness, follow up visit for the below conditions:  He didn't tolerate donepezil.  Hypertension:    Using medication without problems or lightheadedness: yes Chest pain with exertion:no Edema:no Short of breath:no Labs d/w pt.    Elevated Cholesterol: Using medications without problems:yes Muscle aches: no Diet compliance: yes Exercise: yes Labs d/w pt.    Vascular disease per outside clinic.  Up to date on follow up imaging.    PMH and SH reviewed  Meds, vitals, and allergies reviewed.   ROS: Per HPI.  Unless specifically indicated otherwise in  HPI, the patient denies:  General: fever. Eyes: acute vision changes ENT: sore throat Cardiovascular: chest pain Respiratory: SOB GI: vomiting GU: dysuria Musculoskeletal: acute back pain Derm: acute rash Neuro: acute motor dysfunction Psych: worsening mood Endocrine: polydipsia Heme: bleeding Allergy: hayfever  GEN: nad, alert and oriented HEENT: mucous membranes moist NECK: supple w/o LA CV: rrr. PULM: ctab, no inc wob ABD: soft, +bs EXT: no edema SKIN: no acute rash

## 2022-11-14 NOTE — Assessment & Plan Note (Signed)
Flu previously done Shingles previously done PNA previously done Tetanus up-to-date COVID-vaccine previously done. RSV vaccine discussed with patient. Colon cancer screening deferred given his age.  He agrees. Prostate cancer screening 2024 Advance directive-wife designated if patient were incapacitated. Cognitive function addressed- see scanned forms- and if abnormal then additional documentation follows.

## 2022-11-14 NOTE — Assessment & Plan Note (Signed)
Discussed trial of Namenda 5 mg a day and then increasing to 5 mg twice a day if tolerated.  See after visit summary.  Continue vascular risk factor modification.

## 2022-11-14 NOTE — Assessment & Plan Note (Signed)
Continue atorvastatin.  Continue work on diet and exercise. 

## 2022-11-14 NOTE — Assessment & Plan Note (Signed)
Continue amlodipine hydrochlorothiazide nebivolol.

## 2022-11-14 NOTE — Assessment & Plan Note (Signed)
Followed by vascular surgery.  I will defer.  He agrees.

## 2022-11-14 NOTE — Assessment & Plan Note (Signed)
Advance directive- wife designated if patient were incapacitated.  

## 2023-02-28 ENCOUNTER — Other Ambulatory Visit: Payer: Self-pay | Admitting: Family Medicine

## 2023-03-18 ENCOUNTER — Other Ambulatory Visit: Payer: Self-pay | Admitting: Family Medicine

## 2023-04-11 ENCOUNTER — Other Ambulatory Visit: Payer: Self-pay | Admitting: Cardiovascular Disease

## 2023-05-29 ENCOUNTER — Other Ambulatory Visit: Payer: Self-pay | Admitting: Cardiovascular Disease

## 2023-07-01 NOTE — Progress Notes (Unsigned)
Cardiology Office Note  Date:  07/02/2023   ID:  Fowler, Ray 07-24-1941, MRN [id number]  PCP:  Joaquim Nam, MD   Chief Complaint  Patient presents with   12 month follow up     "Doing well." Medications reviewed by the patient verbally.      HPI:  Ray Fowler is a 82 y/o male with h/o HTN,   hyperlipidemia,  s/p Type Id hybrid arch repair in 10/2011 s/p ascending aortic and hemi-arch replacement for acute type A dissection in 03/2009.  complicated by cardiac tamponade,  CT showed extension of the dissection from the aortic root all the way down to the femoral arteries,  s/p aortic root replacement of hemiarch repair and aortic valve resuspension.  Post-op course complicated by persistent HTN and atrial flutter treated with amio (stopped in January 2011).   Followed by cardiothoracic surgery at [facility] on an annual basis with CT scan surveillance. He presents for routine followup of his of his hypertension and aortic dissection  Last seen by myself in clinic November 2023 Seen by Dr. Kizzie Bane at [facility] February 2024 CT scan 2/24  No regular exercise program Sits in the car when his wife goes into the shopping stores No regular hobbies Plays with the grandchildren,, new great grandchild  No chest pain or shortness of breath on exertion, no leg edema no PND orthopnea Has not been checking blood pressure at home on a regular basis  Echocardiogram November 2023 Normal ejection fraction, normal RV size and function No significant valvular heart disease  Carotid ultrasound Patent vessels including graft  EKG personally reviewed by myself on todays visit EKG Interpretation Date/Time:  Tuesday July 02 2023 11:03:01 EST Ventricular Rate:  66 PR Interval:  184 QRS Duration:  96 QT Interval:  408 QTC Calculation: 427 R Axis:   44  Text Interpretation: Sinus rhythm with Premature atrial complexes Nonspecific ST and T wave abnormality When compared  with ECG of 21-Aug-2016 12:34, No significant change was found Confirmed by Julien Nordmann [phone]) on 07/02/2023 11:16:24 AM   Covid  aug 2022,   Other past medical history reviewed chronic dizziness Take meclizine as needed  Lab Results  Component Value Date   CHOL 139 11/06/2022   HDL 43.60 11/06/2022   LDLCALC 69 11/06/2022   TRIG 133.0 11/06/2022    Echo MILD LV DYSFUNCTION (See above) WITH MILD LVH, 50%   NORMAL LA PRESSURES WITH DIASTOLIC DYSFUNCTION   NORMAL RIGHT VENTRICULAR SYSTOLIC FUNCTION   VALVULAR REGURGITATION: MILD AR, TRIVIAL MR, TRIVIAL PR, TRIVIAL TR   NO VALVULAR STENOSIS  In hospital 08/21/2016 to 1/15 Had flu and PNA 08/2016 CT 08/26/2016 [facility]   Prior ECHO showed EF 45-50%. Had cardiac CT in 12/10 after abnormal myvoiew. Coronaries normal except for 25% RCA. 6mm stable pulmonary nodule.    echo (02/2010) EF 55-60% with grade 1 diastolic dysfx and previous AO dissection flap.    PMH:   has a past medical history of Allergic rhinitis, Allergy, Aortic arch aneurysm ([facility]) (10/2011), Aortic dissection ([facility]) (2010), Arthritis, BCC (basal cell carcinoma of skin) (2014), Blood transfusion without reported diagnosis, Carotid bruit, Cataract (2020), Complication of anesthesia, Diverticulosis, Family history of adverse reaction to anesthesia, History of kidney stones, HTN (hypertension), Hyperlipidemia, Irregular heart beat, Macular degeneration of left eye (2020), Pneumonia, PONV (postoperative nausea and vomiting), Prostate cancer ([facility]), Pulmonary nodule, and Skin cancer.  PSH:    Past Surgical History:  Procedure Laterality Date   ABDOMINAL  AORTIC ANEURYSM REPAIR  march 2013   Axillary Mass removal  05/2000   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[place])   CARDIAC SURGERY  2013   at [facility]- transverse aortic arch/descending aneurysm repair s/p debranching procedure  (total of 5 Gore C-TAG devices extending from distal descending aorta to his prev placed ascending  dacron graft)   COLONOSCOPY     Flex- Polyp   06/1999   at 38cm, Int/Ext Hemm divertics Russella Dar)   Hemi Arch Replacement  03/2009 and repeat 10/2011   and AoV resuspension w/ Repl of Ascending Aorta and Hemiarch w/ Graft; Pericardial Tamponade Aortic Regurg 8/22-8/28/10;  endovascular repair of aortic dilation 2013 with  ascending aorta to L common carotid, R subclavian, R common carotid and L carotid subclavian bypass   [facility]  08/22-8/28/2010   Thoracic aortic dissection pericardial teamponade aortic regurg   INGUINAL HERNIA REPAIR Right 12/24/2017   Procedure: HERNIA REPAIR INGUINAL ADULT;  Surgeon: Gaynelle Adu, MD;  Location: [facility];  Service: General;  Laterality: Right;   INSERTION OF MESH Right 12/24/2017   Procedure: INSERTION OF MESH;  Surgeon: Gaynelle Adu, MD;  Location: [facility];  Service: General;  Laterality: Right;   melanoma removed from scalp  06/2006   [place]   PENILE PROSTHESIS IMPLANT  1996   POLYPECTOMY     PROSTATECTOMY  12/1993   Dr. Isabel Caprice   WISDOM TOOTH EXTRACTION  10/2005    Current Outpatient Medications  Medication Sig Dispense Refill   aspirin EC 81 MG tablet Take 1 tablet (81 mg total) by mouth daily. Swallow whole.     FLOVENT HFA 110 MCG/ACT inhaler INHALE 1 PUFF INTO THE LUNGS TWICE DAILY*RINSE MOUTH AFTER USE* 12 g 2   fluticasone (FLONASE) 50 MCG/ACT nasal spray USE 1 SPRAY IN EACH NOSTRIL ONCE DAILY AS NEEDED FOR CONGESTION 16 g 3   Lutein-Zeaxanthin 25-5 MG CAPS Take 1 capsule by mouth daily.     meclizine (ANTIVERT) 25 MG tablet Take 1 tablet (25 mg total) by mouth 3 (three) times daily as needed for dizziness.     memantine (NAMENDA) 5 MG tablet TAKE ONE TABLET (5MG ) BY MOUTH ONCE DAILY FOR ONE WEEK. THEN TAKE ONE TABLET (5MG ) TWICE A DAY IF TOLERATED, THEREAFTER. 60 tablet 2   promethazine (PHENERGAN) 25 MG tablet TAKE ONE TABLET BY MOUTH EVERY SIX HOURS AS NEEDED FOR NAUSEA OR VERTIGO 30 tablet 3   amLODipine (NORVASC) 5 MG tablet TAKE ONE  TABLET BY MOUTH ONCE A DAY 90 tablet 3   atorvastatin (LIPITOR) 20 MG tablet Take 1 tablet (20 mg total) by mouth at bedtime. 90 tablet 3   hydrochlorothiazide (HYDRODIURIL) 25 MG tablet Take 1 tablet (25 mg total) by mouth every other day. 45 tablet 3   Nebivolol HCl 20 MG TABS Take 1 tablet (20 mg total) by mouth daily. 90 tablet 3   No current facility-administered medications for this visit.     Allergies:   Amlodipine, Donepezil, Lactose intolerance (gi), and Quinolones   Social History:  The patient  reports that he quit smoking about 28 years ago. His smoking use included cigarettes. He started smoking about 54 years ago. He has a 25 pack-year smoking history. He has never used smokeless tobacco. He reports current alcohol use. He reports that he does not use drugs.   Family History:   family history includes Colon polyps in his sister; Diabetes in some other family members; Heart disease in his father and mother; Heart failure  in his mother; Hyperlipidemia in his daughter; Hypertension in his mother; Kidney disease in his father; Kidney failure in his father; Prostate cancer in his father; Stroke in his father.    Review of Systems: Review of Systems  Constitutional: Negative.   HENT: Negative.    Cardiovascular: Negative.   Gastrointestinal: Negative.   Musculoskeletal: Negative.   Neurological: Negative.   Psychiatric/Behavioral: Negative.    All other systems reviewed and are negative.   PHYSICAL EXAM: VS:  BP (!) 140/65 (BP Location: Left Arm)   Pulse 66   Ht 5\' 11"  (1.803 m)   Wt 163 lb 6 oz (74.1 kg)   SpO2 97%   BMI 22.79 kg/m  , BMI Body mass index is 22.79 kg/m. Constitutional:  oriented to person, place, and time. No distress.  HENT:  Head: Grossly normal Eyes:  no discharge. No scleral icterus.  Neck: No JVD, no carotid bruits  Cardiovascular: Regular rate and rhythm, no murmurs appreciated Pulmonary/Chest: Clear to auscultation bilaterally, no wheezes or  rails Abdominal: Soft.  no distension.  no tenderness.  Musculoskeletal: Normal range of motion Neurological:  normal muscle tone. Coordination normal. No atrophy Skin: Skin warm and dry Psychiatric: normal affect, pleasant  Recent Labs: 11/06/2022: ALT 19; BUN 22; Creatinine, Ser 1.04; Hemoglobin 15.8; Platelets 179.0; Potassium 3.9; Sodium 142; TSH 3.01    Lipid Panel Lab Results  Component Value Date   CHOL 139 11/06/2022   HDL 43.60 11/06/2022   LDLCALC 69 11/06/2022   TRIG 133.0 11/06/2022     Wt Readings from Last 3 Encounters:  07/02/23 163 lb 6 oz (74.1 kg)  11/12/22 164 lb (74.4 kg)  08/17/22 166 lb (75.3 kg)    ASSESSMENT AND PLAN:  Essential hypertension -  Initial blood pressure running high, repeat blood pressure with mild improvement Commend close monitoring of blood pressure at home No changes made to the medications.  Coronary artery disease involving native coronary artery of native heart without angina pectoris Currently with no symptoms of angina. No further workup at this time. Continue current medication regimen.  Cholesterol at goal  Frequent PVCs Asymptomatic, not on EKG today No medication changes made  Dissection of thoracic aorta [facility]), H/O ascending aorta repair Followed at [facility], scan every 24 months Scheduled February 2026 Appears anatomy is stable  Debility/leg weakness Recommend regular walking program  Orders Placed This Encounter  Procedures   EKG 12-Lead     Signed, Dossie Arbour, M.D., Ph.D. 07/02/2023  [facility] [place], [place] [phone]

## 2023-07-02 ENCOUNTER — Encounter: Payer: Self-pay | Admitting: Cardiovascular Disease

## 2023-07-02 ENCOUNTER — Ambulatory Visit: Payer: Medicare Other | Attending: Cardiovascular Disease | Admitting: Cardiovascular Disease

## 2023-07-02 VITALS — BP 140/65 | HR 66 | Ht 71.0 in | Wt 163.4 lb

## 2023-07-02 DIAGNOSIS — I25118 Atherosclerotic heart disease of native coronary artery with other forms of angina pectoris: Secondary | ICD-10-CM | POA: Diagnosis not present

## 2023-07-02 DIAGNOSIS — I493 Ventricular premature depolarization: Secondary | ICD-10-CM

## 2023-07-02 DIAGNOSIS — E785 Hyperlipidemia, unspecified: Secondary | ICD-10-CM

## 2023-07-02 DIAGNOSIS — I4892 Unspecified atrial flutter: Secondary | ICD-10-CM

## 2023-07-02 DIAGNOSIS — I10 Essential (primary) hypertension: Secondary | ICD-10-CM | POA: Diagnosis not present

## 2023-07-02 DIAGNOSIS — Z9889 Other specified postprocedural states: Secondary | ICD-10-CM

## 2023-07-02 DIAGNOSIS — I359 Nonrheumatic aortic valve disorder, unspecified: Secondary | ICD-10-CM | POA: Diagnosis not present

## 2023-07-02 MED ORDER — AMLODIPINE BESYLATE 5 MG PO TABS: ORAL_TABLET | ORAL | 3 refills | Status: DC

## 2023-07-02 MED ORDER — ATORVASTATIN CALCIUM 20 MG PO TABS: 20.0000 mg | ORAL_TABLET | Freq: Every day | ORAL | 3 refills | Status: DC

## 2023-07-02 MED ORDER — HYDROCHLOROTHIAZIDE 25 MG PO TABS: 25.0000 mg | ORAL_TABLET | ORAL | 3 refills | Status: DC

## 2023-07-02 MED ORDER — NEBIVOLOL HCL 20 MG PO TABS: 1.0000 | ORAL_TABLET | Freq: Every day | ORAL | 3 refills | Status: DC

## 2023-07-02 NOTE — Patient Instructions (Signed)

## 2023-07-05 ENCOUNTER — Encounter: Payer: Self-pay | Admitting: Family Medicine

## 2023-07-05 ENCOUNTER — Ambulatory Visit: Payer: Medicare Other | Admitting: Family Medicine

## 2023-07-05 VITALS — BP 128/80 | HR 59 | Temp 98.3°F | Ht 71.0 in | Wt 166.2 lb

## 2023-07-05 DIAGNOSIS — R413 Other amnesia: Secondary | ICD-10-CM

## 2023-07-05 MED ORDER — MEMANTINE HCL 10 MG PO TABS: 10.0000 mg | ORAL_TABLET | Freq: Two times a day (BID) | ORAL | 3 refills | Status: DC

## 2023-07-05 NOTE — Progress Notes (Unsigned)
BP is usually higher was med clinic.    Memory change.  Had started on namenda.  He had worked up to taking 5mg  TID.  No ADE on med.    Memory d/w pt.  Some occ lapses, some days with "foggy memory."  Some days are better than others.  Sleeping well. No hallucinations.  No abnormal dreams.  Mood is stable, at baseline.  No tremor.    Meds, vitals, and allergies reviewed.   ROS: Per HPI unless specifically indicated in ROS section      RRR Ctab

## 2023-07-05 NOTE — Patient Instructions (Signed)
Recheck at a yearly visit in about 6 months. Labs ahead of time if possible.  Recheck memory at the visit.  Increase namenda to 10mg  twice a day.  Update me as needed.   Recheck memory at that point.

## 2023-07-07 NOTE — Assessment & Plan Note (Signed)
MMSE 28 out of 30, -2 for recall.  This is 1 point better than previous testing.  D/w pt about presumed multifactorial cause for memory loss with prev illness, prev CVA.  Also discussed the possibility of primary memory loss separate from the above, i.e. dementia.  Discussed getting extra neurocognitive testing done to try to better qualify his type of memory loss.  Discussed that it may not change the plan.  He is going to consider if he wants to go through with that in the meantime.  Discussed that either way, he has an indication for a medication like Namenda.  Discussed that if his symptoms were entirely vascular in nature, then 1 would expect his symptoms to be relatively static and it would make sense to control vascular risk factors is much as possible.  Recheck at a yearly visit in about 6 months. Labs ahead of time if possible.  Recheck memory at the visit.  Increase namenda to 10mg  twice a day.  Update me as needed in the meantime.  42 minutes were devoted to patient care in this encounter (this includes time spent reviewing the patient's file/history, interviewing and examining the patient, counseling/reviewing plan with patient).

## 2023-07-12 ENCOUNTER — Other Ambulatory Visit: Payer: Self-pay | Admitting: Cardiovascular Disease

## 2023-09-20 ENCOUNTER — Telehealth: Payer: Self-pay | Admitting: Family Medicine

## 2023-09-20 DIAGNOSIS — R413 Other amnesia: Secondary | ICD-10-CM

## 2023-09-20 NOTE — Telephone Encounter (Signed)
 Copied from CRM [id number]. Topic: Referral - Question >> Sep 20, 2023  3:13 PM [place] wrote: Reason for CRM: patient is looking for a referral for a neurologist in [place]. Please reach out to Patient as soon as possible

## 2023-09-25 NOTE — Telephone Encounter (Signed)
I put in the referral.  Please let him know.  Thanks.

## 2023-09-25 NOTE — Telephone Encounter (Signed)
Spoke with patient spouse Britta Mccreedy who is on the DPR to advise that the referral has been placed

## 2023-09-25 NOTE — Addendum Note (Signed)
Addended by: Joaquim Nam on: 09/25/2023 02:20 PM   Modules accepted: Orders

## 2023-11-14 ENCOUNTER — Ambulatory Visit (INDEPENDENT_AMBULATORY_CARE_PROVIDER_SITE_OTHER): Admitting: Family Medicine

## 2023-11-14 ENCOUNTER — Encounter: Admitting: Family Medicine

## 2023-11-14 ENCOUNTER — Encounter: Payer: Self-pay | Admitting: Family Medicine

## 2023-11-14 VITALS — BP 150/80 | HR 67 | Temp 97.7°F | Ht 69.9 in | Wt 169.0 lb

## 2023-11-14 DIAGNOSIS — Z7189 Other specified counseling: Secondary | ICD-10-CM

## 2023-11-14 DIAGNOSIS — I10 Essential (primary) hypertension: Secondary | ICD-10-CM

## 2023-11-14 DIAGNOSIS — R413 Other amnesia: Secondary | ICD-10-CM

## 2023-11-14 DIAGNOSIS — Z125 Encounter for screening for malignant neoplasm of prostate: Secondary | ICD-10-CM

## 2023-11-14 DIAGNOSIS — E785 Hyperlipidemia, unspecified: Secondary | ICD-10-CM

## 2023-11-14 DIAGNOSIS — Z0000 Encounter for general adult medical examination without abnormal findings: Secondary | ICD-10-CM

## 2023-11-14 DIAGNOSIS — Z9889 Other specified postprocedural states: Secondary | ICD-10-CM

## 2023-11-14 NOTE — Progress Notes (Signed)
 Hypertension:    Using medication without problems or lightheadedness: yes Chest pain with exertion:no Edema:no Short of breath:no  Elevated Cholesterol: Using medications without problems: yes Muscle aches: no Diet compliance: d/w pt.  Exercise: d/w pt.  Labs pending.  He isn't fasting.  Walking for exercise.    No regular use/need of flovent.    Memory.  Still on namenda.  Not on donepezil.  MMSE 25/30.  No red flag events.  No acute changes per patient report, ie similar to prev.  Wife had noticed that his memory had been better on some days than others.  Aorta hx d/w pt. Imaging done per [facility].    Colonoscopy 2019 PSA pending 2025.   Advance directive- wife designated if patient were incapacitated.  Shingles prev done.   Flu 2014 RSV vaccine prev done.  PNA up to date.  Tetanus up to date.    Meds, vitals, and allergies reviewed.   ROS: Per HPI unless specifically indicated in ROS section   GEN: nad, alert and pleasant in conversation. HEENT: mucous membranes moist NECK: supple w/o LA CV: rrr PULM: ctab, no inc wob ABD: soft, +bs EXT: no edema SKIN: no acute rash  MMSE 25 out of 30.  -1 for orientation -1 for attention, -3 for recall.

## 2023-11-14 NOTE — Patient Instructions (Addendum)
 Go to the lab on the way out.   If you have mychart we'll likely use that to update you.    Take care.  Glad to see you. I want to recheck your memory at a visit in about 6 months, sooner if needed.    If your BP stays above 140/90, then let me know.

## 2023-11-15 LAB — COMPREHENSIVE METABOLIC PANEL WITH GFR
ALT: 18 U/L (ref 0–53)
AST: 20 U/L (ref 0–37)
Albumin: 4.6 g/dL (ref 3.5–5.2)
Alkaline Phosphatase: 69 U/L (ref 39–117)
BUN: 22 mg/dL (ref 6–23)
CO2: 33 meq/L — ABNORMAL HIGH (ref 19–32)
Calcium: 9.4 mg/dL (ref 8.4–10.5)
Chloride: 98 meq/L (ref 96–112)
Creatinine, Ser: 1.11 mg/dL (ref 0.40–1.50)
GFR: 62.25 mL/min (ref >60.00)
Glucose, Bld: 104 mg/dL — ABNORMAL HIGH (ref 70–99)
Potassium: 3.9 meq/L (ref 3.5–5.1)
Sodium: 142 meq/L (ref 135–145)
Total Bilirubin: 0.9 mg/dL (ref 0.2–1.2)
Total Protein: 7.3 g/dL (ref 6.0–8.3)

## 2023-11-15 LAB — CBC WITH DIFFERENTIAL/PLATELET
Basophils Absolute: 0.1 10*3/uL (ref 0.0–0.1)
Basophils Relative: 1.4 % (ref 0.0–3.0)
Eosinophils Absolute: 0.5 10*3/uL (ref 0.0–0.7)
Eosinophils Relative: 5.5 % — ABNORMAL HIGH (ref 0.0–5.0)
HCT: 45.3 % (ref 39.0–52.0)
Hemoglobin: 15.4 g/dL (ref 13.0–17.0)
Lymphocytes Relative: 22 % (ref 12.0–46.0)
Lymphs Abs: 1.9 10*3/uL (ref 0.7–4.0)
MCHC: 34 g/dL (ref 30.0–36.0)
MCV: 90.8 fl (ref 78.0–100.0)
Monocytes Absolute: 0.8 10*3/uL (ref 0.1–1.0)
Monocytes Relative: 9.6 % (ref 3.0–12.0)
Neutro Abs: 5.2 10*3/uL (ref 1.4–7.7)
Neutrophils Relative %: 61.5 % (ref 43.0–77.0)
Platelets: 177 10*3/uL (ref 150.0–400.0)
RBC: 4.98 Mil/uL (ref 4.22–5.81)
RDW: 13.9 % (ref 11.5–15.5)
WBC: 8.4 10*3/uL (ref 4.0–10.5)

## 2023-11-15 LAB — LIPID PANEL
Cholesterol: 135 mg/dL (ref 0–200)
HDL: 37.5 mg/dL — ABNORMAL LOW (ref >39.00)
LDL Cholesterol: 39 mg/dL (ref 0–99)
NonHDL: 97.73
Total CHOL/HDL Ratio: 4
Triglycerides: 296 mg/dL — ABNORMAL HIGH (ref 0.0–149.0)
VLDL: 59.2 mg/dL — ABNORMAL HIGH (ref 0.0–40.0)

## 2023-11-15 LAB — VITAMIN B12: Vitamin B-12: 345 pg/mL (ref 211–911)

## 2023-11-15 LAB — TSH: TSH: 1.83 u[IU]/mL (ref 0.35–5.50)

## 2023-11-15 LAB — PSA, MEDICARE: PSA: 0 ng/mL — ABNORMAL LOW (ref 0.10–4.00)

## 2023-11-17 NOTE — Assessment & Plan Note (Signed)
Continue atorvastatin.  See notes on labs. 

## 2023-11-17 NOTE — Assessment & Plan Note (Signed)
 Advance directive- wife designated if patient were incapacitated.

## 2023-11-17 NOTE — Assessment & Plan Note (Signed)
 Colonoscopy 2019 PSA pending 2025.   Advance directive- wife designated if patient were incapacitated.  Shingles prev done.   Flu 2014 RSV vaccine prev done.  PNA up to date.  Tetanus up to date.

## 2023-11-17 NOTE — Assessment & Plan Note (Signed)
 Aorta hx d/w pt. Imaging done per [facility].   No chest pain and I will defer.  He agrees.

## 2023-11-17 NOTE — Assessment & Plan Note (Addendum)
 Continue Namenda for now.  See notes on labs.  MMSE 25 out of 30 today.  -1 for orientation -1 for attention, -3 for recall.

## 2023-11-17 NOTE — Assessment & Plan Note (Addendum)
 Continue amlodipine hydrochlorothiazide nebivolol.  His blood pressure still mildly elevated.  I asked him to let me know if it remains elevated.  See notes on labs.  He is not fasting.

## 2023-11-24 ENCOUNTER — Other Ambulatory Visit: Payer: Self-pay | Admitting: Family Medicine

## 2023-11-24 DIAGNOSIS — R413 Other amnesia: Secondary | ICD-10-CM

## 2023-12-06 ENCOUNTER — Ambulatory Visit: Payer: Medicare Other

## 2023-12-06 VITALS — Ht 69.9 in | Wt 169.0 lb

## 2023-12-06 DIAGNOSIS — Z0000 Encounter for general adult medical examination without abnormal findings: Secondary | ICD-10-CM

## 2023-12-06 NOTE — Patient Instructions (Signed)
 Ray Fowler , Thank you for taking time to come for your Medicare Wellness Visit. I appreciate your ongoing commitment to your health goals. Please review the following plan we discussed and let me know if I can assist you in the future.   Referrals/Orders/Follow-Ups/Clinician Recommendations: none  This is a list of the screening recommended for you and due dates:  Health Maintenance  Topic Date Due   COVID-19 Vaccine (6 - 2024-25 season) 04/14/2023   Flu Shot  03/13/2024   Medicare Annual Wellness Visit  12/05/2024   DTaP/Tdap/Td vaccine (3 - Td or Tdap) 09/17/2028   Pneumonia Vaccine  Completed   Zoster (Shingles) Vaccine  Completed   HPV Vaccine  Aged Out   Meningitis B Vaccine  Aged Out    Advanced directives: (Copy Requested) Please bring a copy of your health care power of attorney and living will to the office to be added to your chart at your convenience. You can mail to [facility] [address] [place], [place] [address] or email to [email or web address]  Next Medicare Annual Wellness Visit scheduled for next year: Yes 12/10/2024

## 2023-12-06 NOTE — Progress Notes (Signed)
 Subjective:   Ray Fowler is a 83 y.o. who presents for a Medicare Wellness preventive visit.  Visit Complete: Virtual I connected with  Ray Fowler on 12/06/23 by a video and audio enabled telemedicine application and verified that I am speaking with the correct person using two identifiers.  Patient Location: Home  Provider Location: Office/Clinic  I discussed the limitations of evaluation and management by telemedicine. The patient expressed understanding and agreed to proceed.  Vital Signs: Because this visit was a virtual/telehealth visit, some criteria may be missing or patient reported. Any vitals not documented were not able to be obtained and vitals that have been documented are patient reported.  Persons Participating in Visit: Patient.  AWV Questionnaire: No: Patient Medicare AWV questionnaire was not completed prior to this visit.  Cardiac Risk Factors include: advanced age (>44men, >58 women);dyslipidemia;hypertension;male gender     Objective:    Today's Vitals   12/06/23 1534  Weight: 169 lb (76.7 kg)  Height: 5' 9.9" (1.775 m)   Body mass index is 24.32 kg/m.     12/06/2023    3:46 PM 09/04/2018    9:45 AM 12/24/2017    6:28 AM 12/20/2017   11:22 AM 11/05/2017   11:15 AM 08/21/2017    8:57 AM 08/25/2016    3:34 PM  Advanced Directives  Does Patient Have a Medical Advance Directive? Yes Yes Yes Yes Yes Yes Yes  Type of Estate agent of [place];Living will Healthcare Power of [place];Living will Healthcare Power of [place];Living will Healthcare Power of [place];Living will Healthcare Power of [place];Living will Healthcare Power of [place];Living will Healthcare Power of Attorney  Does patient want to make changes to medical advance directive?   No - Patient declined No - Patient declined   No - Patient declined  Copy of Healthcare Power of Attorney in Chart? No - copy requested No - copy requested No - copy requested No -  copy requested  No - copy requested No - copy requested  Would patient like information on creating a medical advance directive?   No - Patient declined No - Patient declined       Current Medications (verified) Outpatient Encounter Medications as of 12/06/2023  Medication Sig   amLODipine  (NORVASC ) 5 MG tablet TAKE ONE TABLET BY MOUTH ONCE A DAY   aspirin  EC 81 MG tablet Take 1 tablet (81 mg total) by mouth daily. Swallow whole.   atorvastatin  (LIPITOR) 20 MG tablet Take 1 tablet (20 mg total) by mouth at bedtime.   FLOVENT  HFA 110 MCG/ACT inhaler INHALE 1 PUFF INTO THE LUNGS TWICE DAILY*RINSE MOUTH AFTER USE*   fluticasone  (FLONASE ) 50 MCG/ACT nasal spray USE 1 SPRAY IN EACH NOSTRIL ONCE DAILY AS NEEDED FOR CONGESTION   hydrochlorothiazide  (HYDRODIURIL ) 25 MG tablet Take 1 tablet (25 mg total) by mouth every other day.   Lutein-Zeaxanthin 25-5 MG CAPS Take 1 capsule by mouth daily.   meclizine  (ANTIVERT ) 25 MG tablet Take 1 tablet (25 mg total) by mouth 3 (three) times daily as needed for dizziness.   memantine  (NAMENDA ) 10 MG tablet Take 1 tablet (10 mg total) by mouth 2 (two) times daily.   Nebivolol  HCl 20 MG TABS TAKE ONE TABLET BY MOUTH ONE TIME DAILY   promethazine  (PHENERGAN ) 25 MG tablet TAKE ONE TABLET BY MOUTH EVERY SIX HOURS AS NEEDED FOR NAUSEA OR VERTIGO   No facility-administered encounter medications on file as of 12/06/2023.    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 Carotid u/s 2/11 normal.   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   Pneumonia    2018   PONV (postoperative nausea and vomiting)    Prostate cancer (HCC)    1995   Pulmonary nodule    Stable on CT 2/11   Skin cancer    scalp 2007  melanoma   Past Surgical History:  Procedure Laterality Date   ABDOMINAL AORTIC ANEURYSM REPAIR  march 2013   Axillary Mass removal  05/2000   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[place])   CARDIAC SURGERY  2013   at [facility]- transverse aortic arch/descending aneurysm repair s/p debranching procedure  (total of 5 Gore C-TAG devices extending from distal descending aorta to his prev placed ascending dacron graft)   COLONOSCOPY     Flex- Polyp   06/1999   at 38cm, Int/Ext Hemm divertics Sandrea Cruel)   Hemi Arch Replacement  03/2009 and repeat 10/2011   and AoV resuspension w/ Repl of Ascending Aorta and Hemiarch w/ Graft; Pericardial Tamponade Aortic Regurg 8/22-8/28/10;  endovascular repair of aortic dilation 2013 with  ascending aorta to L common carotid, R subclavian, R common carotid and L carotid subclavian bypass   [facility]  08/22-8/28/2010   Thoracic aortic dissection pericardial teamponade aortic regurg   INGUINAL HERNIA REPAIR Right 12/24/2017   Procedure: HERNIA REPAIR INGUINAL ADULT;  Surgeon: Aldean Hummingbird, MD;  Location: [facility];  Service: General;  Laterality: Right;   INSERTION OF MESH Right 12/24/2017   Procedure: INSERTION OF MESH;  Surgeon: Aldean Hummingbird, MD;  Location: [facility];  Service: General;  Laterality: Right;   melanoma removed from scalp  06/2006   [place]   PENILE PROSTHESIS IMPLANT  1996   POLYPECTOMY     PROSTATECTOMY  12/1993   Dr. Bosie Bye   WISDOM TOOTH EXTRACTION  10/2005   Family History  Problem Relation Age of Onset   Heart failure Mother        died age 68    Hypertension Mother    Heart disease Mother        CHF   Kidney failure Father    Heart disease Father        coronary disease, CABG   Stroke Father        due to "infection" from [place]   Kidney disease Father        chronic   Prostate cancer Father    Diabetes Other    Colon polyps Sister    Diabetes Other    Hyperlipidemia Daughter    Colon cancer Neg Hx    Esophageal cancer Neg Hx    Stomach cancer Neg Hx  Rectal cancer Neg Hx    Social History   Socioeconomic History   Marital status: Married    Spouse name: Not on file   Number of children: 3   Years of education: Not on file   Highest education level: Not on file  Occupational History   Occupation: Retired    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  Tobacco Use   Smoking status: Former    Current packs/day: 0.00    Average packs/day: 1 pack/day for 25.0 years (25.0 ttl pk-yrs)    Types: Cigarettes    Start date: 07/10/1969    Quit date: 07/10/1994    Years since quitting: 29.4   Smokeless tobacco: Never   Tobacco comments:    quit 1995  Vaping Use   Vaping status: Never Used  Substance and Sexual Activity   Alcohol use: Yes    Comment: occassionally wine   Drug use: Never   Sexual activity: Yes  Other Topics Concern   Not on file  Social History Narrative   Married 1964   Retired from [employer] in ~2008   Social Drivers of Health   Financial Resource Strain: Low Risk  (12/06/2023)   Overall Financial Resource Strain (CARDIA)    Difficulty of Paying Living Expenses: Not hard at all  Food Insecurity: No Food Insecurity (12/06/2023)   Hunger Vital Sign    Worried About Running Out of Food in the Last Year: Never true    Ran Out of Food in the Last Year: Never true  Transportation Needs: No Transportation Needs (12/06/2023)   PRAPARE - Administrator, Civil Service (Medical): No    Lack of Transportation (Non-Medical): No  Physical Activity:  Insufficiently Active (12/06/2023)   Exercise Vital Sign    Days of Exercise per Week: 3 days    Minutes of Exercise per Session: 30 min  Stress: No Stress Concern Present (12/06/2023)   [employer] of Occupational Health - Occupational Stress Questionnaire    Feeling of Stress : Not at all  Social Connections: Socially Integrated (12/06/2023)   Social Connection and Isolation Panel [NHANES]    Frequency of Communication with Friends and Family: Twice a week    Frequency of Social Gatherings with Friends and Family: Twice a week    Attends Religious Services: More than 4 times per year    Active Member of [employer] or Organizations: Yes    Attends Engineer, structural: More than 4 times per year    Marital Status: Married    Tobacco Counseling Counseling given: Not Answered Tobacco comments: quit 1995    Clinical Intake:  Pre-visit preparation completed: Yes  Pain : No/denies pain     BMI - recorded: 24.32 Nutritional Status: BMI of 19-24  Normal Nutritional Risks: None Diabetes: No  No results found for: "HGBA1C"   How often do you need to have someone help you when you read instructions, pamphlets, or other written materials from your doctor or pharmacy?: 1 - Never  Interpreter Needed?: No  Comments: lives with wife Information entered by :: B.Tierney Behl,LPN   Activities of Daily Living     12/06/2023    3:46 PM  In your present state of health, do you have any difficulty performing the following activities:  Hearing? 0  Vision? 0  Difficulty concentrating or making decisions? 1  Walking or climbing stairs? 1  Dressing or bathing? 0  Doing errands, shopping? 0  Preparing Food and  eating ? N  Using the Toilet? N  In the past six months, have you accidently leaked urine? N  Do you have problems with loss of bowel control? Y  Managing your Medications? N  Managing your Finances? N  Housekeeping or managing your Housekeeping? N    Patient Care  Team: Donnie Galea, MD as PCP - General Jerelene Monday, Deadra Everts, MD (Cardiology) Zula Hitch, MD as Consulting Physician (Ophthalmology) Ann Barnacle, MD (Inactive) as Consulting Physician (Urology) Eduardo Grade, MD as Consulting Physician (Dermatology) Tracy Friedlander, MD as Attending Physician (Cardiothoracic Surgery)  Indicate any recent Medical Services you may have received from other than [facility] providers in the past year (date may be approximate).     Assessment:   This is a routine wellness examination for Ray Fowler.  Hearing/Vision screen Hearing Screening - Comments:: Pt says he hears well Vision Screening - Comments:: Pt says his vision is good w/glasses Dr Joanne Muckle   Goals Addressed             This Visit's Progress    Increase physical activity   On track    12/06/23-I will continue to exercise at least 30-40 min 3 days per week.        Depression Screen     12/06/2023    3:42 PM 07/05/2023   12:36 PM 11/12/2022    2:09 PM 11/07/2021   10:08 AM 09/27/2020    9:56 AM 09/10/2019   10:51 AM 09/04/2018    9:45 AM  PHQ 2/9 Scores  PHQ - 2 Score 0 0 0 0 0 0 0  PHQ- 9 Score  0 0    0    Fall Risk     12/06/2023    3:39 PM 07/05/2023   12:36 PM 11/12/2022    2:09 PM 11/07/2021   10:06 AM 09/27/2020    9:56 AM  Fall Risk   Falls in the past year? 0 0 0 0 0  Number falls in past yr: 0 0 0 0 0  Injury with Fall? 0 0 0 0 0  Risk for fall due to : No Fall Risks No Fall Risks No Fall Risks No Fall Risks No Fall Risks  Follow up Education provided;Falls prevention discussed Falls evaluation completed Falls evaluation completed Falls evaluation completed Falls evaluation completed    MEDICARE RISK AT HOME:  Medicare Risk at Home Any stairs in or around the home?: No If so, are there any without handrails?: No Home free of loose throw rugs in walkways, pet beds, electrical cords, etc?: Yes Adequate lighting in your home to reduce risk of falls?: Yes Life alert?:  No Use of a cane, walker or w/c?: No Grab bars in the bathroom?: Yes Shower chair or bench in shower?: No Elevated toilet seat or a handicapped toilet?: Yes  TIMED UP AND GO:  Was the test performed?  No  Cognitive Function: 6CIT completed    09/04/2018    9:45 AM 08/21/2017    8:13 AM 08/15/2016    9:01 AM  MMSE - Mini Mental State Exam  Orientation to time 5 5 5   Orientation to Place 5 5 5   Registration 3 3 3   Attention/ Calculation 0 0 0  Recall 3 3 3   Language- name 2 objects 0 0 0  Language- repeat 1 1 1   Language- follow 3 step command 3 3 3   Language- read & follow direction 0 0 0  Write a sentence 0 0 0  Copy design 0 0 0  Total score 20 20 20         12/06/2023    3:48 PM  6CIT Screen  What Year? 0 points  What month? 0 points  What time? 0 points  Count back from 20 0 points  Months in reverse 4 points  Repeat phrase 6 points  Total Score 10 points    Immunizations Immunization History  Administered Date(s) Administered   Fluad Quad(high Dose 65+) 04/28/2019, 05/18/2020, 05/10/2021, 05/08/2022, 05/07/2023   Influenza Split 07/11/2011, 05/08/2012   Influenza Whole 06/11/2007, 06/15/2008, 05/30/2009, 06/29/2010   Influenza, High Dose Seasonal PF 05/10/2021   Influenza,inj,Quad PF,6+ Mos 06/25/2013, 04/15/2014, 04/20/2015, 05/02/2016, 05/28/2017, 05/14/2018   Influenza-Unspecified 05/07/2023   PFIZER(Purple Top)SARS-COV-2 Vaccination 10/05/2019, 10/28/2019, 07/14/2020, 12/05/2020   Pfizer Covid-19 Vaccine Bivalent Booster 42yrs & up 05/23/2021   Pneumococcal Conjugate-13 08/09/2014   Pneumococcal Polysaccharide-23 06/28/2009, 05/09/2010   Rsv, Bivalent, Protein Subunit Rsvpref,pf (Abrysvo) 04/02/2023   Td 07/28/2004   Tdap 09/17/2018   Zoster Recombinant(Shingrix) 09/17/2018, 03/02/2019   Zoster, Live 06/19/2007    Screening Tests Health Maintenance  Topic Date Due   COVID-19 Vaccine (6 - 2024-25 season) 04/14/2023   INFLUENZA VACCINE  03/13/2024    Medicare Annual Wellness (AWV)  12/05/2024   DTaP/Tdap/Td (3 - Td or Tdap) 09/17/2028   Pneumonia Vaccine 47+ Years old  Completed   Zoster Vaccines- Shingrix  Completed   HPV VACCINES  Aged Out   Meningococcal B Vaccine  Aged Out    Health Maintenance  Health Maintenance Due  Topic Date Due   COVID-19 Vaccine (6 - 2024-25 season) 04/14/2023   Health Maintenance Items Addressed: None needed at this time  Additional Screening:  Vision Screening: Recommended annual ophthalmology exams for early detection of glaucoma and other disorders of the eye.  Dental Screening: Recommended annual dental exams for proper oral hygiene  Community Resource Referral / Chronic Care Management: CRR required this visit?  No   CCM required this visit?  No    Plan:     I have personally reviewed and noted the following in the patient's chart:   Medical and social history Use of alcohol, tobacco or illicit drugs  Current medications and supplements including opioid prescriptions. Patient is not currently taking opioid prescriptions. Functional ability and status Nutritional status Physical activity Advanced directives List of other physicians Hospitalizations, surgeries, and ER visits in previous 12 months Vitals Screenings to include cognitive, depression, and falls Referrals and appointments  In addition, I have reviewed and discussed with patient certain preventive protocols, quality metrics, and best practice recommendations. A written personalized care plan for preventive services as well as general preventive health recommendations were provided to patient.    Nerissa Bannister, LPN   1/61/0960   After Visit Summary: (MyChart) Due to this being a telephonic visit, the after visit summary with patients personalized plan was offered to patient via MyChart   Notes: Nothing significant to report at this time.

## 2024-01-16 DIAGNOSIS — R413 Other amnesia: Secondary | ICD-10-CM | POA: Diagnosis not present

## 2024-01-16 DIAGNOSIS — G939 Disorder of brain, unspecified: Secondary | ICD-10-CM | POA: Diagnosis not present

## 2024-01-20 ENCOUNTER — Other Ambulatory Visit: Payer: Self-pay | Admitting: Neurology

## 2024-01-20 DIAGNOSIS — R413 Other amnesia: Secondary | ICD-10-CM

## 2024-01-23 ENCOUNTER — Ambulatory Visit: Admission: RE | Admit: 2024-01-23 | Source: Ambulatory Visit

## 2024-01-30 ENCOUNTER — Other Ambulatory Visit: Payer: Self-pay | Admitting: Neurology

## 2024-01-30 DIAGNOSIS — G3184 Mild cognitive impairment, so stated: Secondary | ICD-10-CM

## 2024-02-03 ENCOUNTER — Ambulatory Visit
Admission: RE | Admit: 2024-02-03 | Discharge: 2024-02-03 | Disposition: A | Discharge status: Home / Self Care | Source: Ambulatory Visit | Attending: Neurology | Admitting: Neurology

## 2024-02-03 DIAGNOSIS — G9389 Other specified disorders of brain: Secondary | ICD-10-CM | POA: Diagnosis not present

## 2024-02-03 DIAGNOSIS — G3184 Mild cognitive impairment, so stated: Secondary | ICD-10-CM | POA: Insufficient documentation

## 2024-02-03 DIAGNOSIS — I672 Cerebral atherosclerosis: Secondary | ICD-10-CM | POA: Diagnosis not present

## 2024-02-04 ENCOUNTER — Ambulatory Visit: Attending: Neurology

## 2024-02-04 DIAGNOSIS — R41841 Cognitive communication deficit: Secondary | ICD-10-CM | POA: Insufficient documentation

## 2024-02-04 NOTE — Therapy (Signed)
 OUTPATIENT SPEECH LANGUAGE PATHOLOGY  EVALUATION   Patient Name: Ray Fowler MRN: [id number] DOB:Jul 07, 1941, 84 y.o., male Today's Date: 02/04/2024  PCP: Arlyss Solian, MD REFERRING PROVIDER: Jannett Fairly, MD   End of Session - 02/04/24 1418     Visit Number 1    Number of Visits 24    Date for SLP Re-Evaluation 04/28/24    SLP Start Time 1310    SLP Stop Time  1400    SLP Time Calculation (min) 50 min    Activity Tolerance Patient tolerated treatment well           Patient Active Problem List   Diagnosis Date Noted   History of CVA in adulthood 05/09/2022   Memory change 04/22/2022   Hydronephrosis of right kidney 10/15/2020   Complex renal cyst 02/22/2020   Nephrolithiasis 01/28/2019   Thrombocytopenia (HCC) 09/10/2018   Healthcare maintenance 08/28/2017   Centrilobular emphysema (HCC) 10/04/2016   Advance care planning 08/09/2014   Benign paroxysmal positional vertigo 06/06/2014   Hyperlipidemia 08/19/2012   Medicare annual wellness visit, subsequent 07/18/2012   History of prostate cancer 07/18/2012   Allergic rhinitis 07/18/2012   H/O ascending aorta repair 10/27/2011   PVC (premature ventricular contraction) 11/22/2009   BRUIT 05/27/2009   Secondary cardiomyopathy (HCC) 05/11/2009   Atrial flutter (HCC) 05/11/2009   CAD (coronary artery disease) 04/03/2009   Aortic valve disorder 04/03/2009   Essential hypertension 05/07/2007    ONSET DATE: ~2 years ago per wife report; referral date 01/15/24  REFERRING DIAG: mild cognitive impairment/concern for dementia  THERAPY DIAG:  Cognitive communication deficit  Rationale for Evaluation and Treatment Rehabilitation  SUBJECTIVE:   SUBJECTIVE STATEMENT: Pt alert and cooperate. Flat affect.   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 Patient still performing ADLs. Limited on iADLs. Endorses sedentary lifestyle at home. Symptoms include long-term memory loss, confusion, and changes in gait. Differential diagnosis includes mixed dementia, or secondary causes for memory loss. MRI and blood tests planned...  - Order MRI of the brain without contrast (NeuroQuant)  - order labs to check Vit B12, Vit B1, Vit D, folate, Treponema Pallidum (syphilis) screening cascade, Phosphorylated Tau 217 (p-Tau 217), plasma ([facility] number [id number]), Beta Amyloid 42/40 ratio, Plasma (OjaR1283, [facility] number [id number]), APO Alzheimer's Risk (OjaR1884, [facility] number [id number])  - Set up speech therapy for cognitive training [facility])...   PAIN:  Are you having pain? No  FALLS: Has patient fallen in last 6 months?  No  LIVING ENVIRONMENT: Lives with: lives with their spouse Lives in: House/apartment  PLOF:  Level of assistance: Needed assistance with ADLs Employment: Retired   PATIENT GOALS   to learn about changes to memory  OBJECTIVE:   COGNITIVE COMMUNICATION: Overall cognitive status: Impaired   AUDITORY COMPREHENSION: Overall auditory comprehension: Appears intact    READING COMPREHENSION: WFL for single words  EXPRESSION: verbal  VERBAL EXPRESSION: Level of generative/spontaneous verbalization: conversation Automatic speech: Pragmatics: Impaired: flat affect Comments: impaired confrontation naming   WRITTEN EXPRESSION: Dominant hand: right   Written expression: Appears intact  MOTOR SPEECH: Overall motor speech: Appears intact  ORAL MOTOR EXAMINATION: WFL  STANDARDIZED ASSESSMENTS: Addenbrooke's Cognitive Examination - ACE III The Addenbrooke's Cognitive Examination-III (ACE-III) is a brief cognitive test that assesses five cognitive domains. The total score is 100 with higher scores indicating better  cognitive functioning. Cut off  scores of 88 and 82 are recommended for suspicion of dementia (88 has sensitivity of 1.00 and specificity of 0.96, 82 has sensitivity of 0.93 and specificity of 1.00). American Version A  Attention 16/18  Memory 9/26  Fluency 4/14  Language 23/26  Visuospatial 11/16  TOTAL ACE- III Score 63/100     PATIENT REPORTED OUTCOME MEASURES (PROM): To be given during future sessions   TODAY'S TREATMENT:  Pt and wife educated at length re: role of SLP, results of assessment, rationale for ST, domains of cognition, and SLP POC.   PATIENT EDUCATION: Education details: as above Person educated: Patient and Spouse Education method: Explanation Education comprehension: verbalized understanding and needs further education  HOME EXERCISE PROGRAM:        To be provided, as appropriate, in upcoming sessions    GOALS:  Goals reviewed with patient? Yes  SHORT TERM GOALS: Target date: 10 sessions  Pt will complete PROM re: memory deficits. Baseline: Goal status: INITIAL   2.  Pt wilI utilize compensatory strategies for anomia in 4 of 5 mod/max assistance during structured tasks.   Baseline:  Goal status: INITIAL   3.  Pt and/or wife will report successful implementation of at least x2 strategies to improve functional attention and memory.  Baseline:  Goal status: INITIAL   4.  Pt and/or wife will verbalize x3 factors that may impact cognitive-communication. Baseline:  Goal status: INITIAL    LONG TERM GOALS: Target date: 12 weeks    Patient will demonstrate knowledge of appropriate activities to support cognitive and language function outside of ST with assistance from family.  Baseline:  Goal status: INITIAL     ASSESSMENT:  CLINICAL IMPRESSION:  Patient is a 83 y.o. male who was seen today for cognitive-communication evaluation in setting of dementia. Pt's wife reports changes to long term and short term memory for ~2 years. Assessment completed today via ACE-III. PROM to  be completed in upcoming sessions. Based on today's assessment pt presents with cognitive-communication deficits affecting attention, memory, visuospatial skills, executive functioning, and verbal fluency/wordfinding. Recommend course of skilled ST with emphasis on compensations and education.  OBJECTIVE IMPAIRMENTS include attention, memory, executive functioning, and expressive language. These impairments are limiting patient from managing appointments, managing finances, ADLs/IADLs, and effectively communicating at home and in community. Factors affecting potential to achieve goals and functional outcome are ability to learn/carryover information and severity of impairments.. Patient will benefit from skilled SLP services to address above impairments and improve overall function.  REHAB POTENTIAL: Fair - given diagnosis  PLAN: SLP FREQUENCY: 1-2x/week  SLP DURATION: 12 weeks  PLANNED INTERVENTIONS: Environmental controls, Cueing hierachy, Cognitive reorganization, Internal/external aids, Functional tasks, SLP instruction and feedback, Compensatory strategies, and Patient/family education    Delon Bangs, M.S., CCC-SLP Speech-Language Pathologist [facility] [phone] [place])  [facility] Outpatient Rehabilitation at [facility] [address] [place], [place], [address] Phone: [phone]   Fax:  [phone]

## 2024-02-06 ENCOUNTER — Ambulatory Visit

## 2024-02-10 ENCOUNTER — Ambulatory Visit

## 2024-02-10 DIAGNOSIS — R41841 Cognitive communication deficit: Secondary | ICD-10-CM

## 2024-02-10 NOTE — Therapy (Signed)
 OUTPATIENT SPEECH LANGUAGE PATHOLOGY  TREATMENT   Patient Name: Ray Fowler MRN: [id number] DOB:[birth year], 83 y.o., male Today's Date: 02/10/2024  PCP: Arlyss Solian, MD REFERRING PROVIDER: Jannett Fairly, MD   End of Session - 02/10/24 1353     Visit Number 2    Number of Visits 24    Date for SLP Re-Evaluation 04/28/24    SLP Start Time 1355    SLP Stop Time  1440    SLP Time Calculation (min) 45 min    Activity Tolerance Patient tolerated treatment well           Patient Active Problem List   Diagnosis Date Noted   History of CVA in adulthood 05/09/2022   Memory change 04/22/2022   Hydronephrosis of right kidney 10/15/2020   Complex renal cyst 02/22/2020   Nephrolithiasis 01/28/2019   Thrombocytopenia (HCC) 09/10/2018   Healthcare maintenance 08/28/2017   Centrilobular emphysema (HCC) 10/04/2016   Advance care planning 08/09/2014   Benign paroxysmal positional vertigo 06/06/2014   Hyperlipidemia 08/19/2012   Medicare annual wellness visit, subsequent 07/18/2012   History of prostate cancer 07/18/2012   Allergic rhinitis 07/18/2012   H/O ascending aorta repair 10/27/2011   PVC (premature ventricular contraction) 11/22/2009   BRUIT 05/27/2009   Secondary cardiomyopathy (HCC) 05/11/2009   Atrial flutter (HCC) 05/11/2009   CAD (coronary artery disease) 04/03/2009   Aortic valve disorder 04/03/2009   Essential hypertension 05/07/2007    ONSET DATE: ~2 years ago per wife report; referral date 01/15/24  REFERRING DIAG: mild cognitive impairment/concern for dementia  THERAPY DIAG:  Cognitive communication deficit  Rationale for Evaluation and Treatment Rehabilitation  SUBJECTIVE:   SUBJECTIVE STATEMENT: Pt alert and cooperate. Flat affect.   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 Patient still performing ADLs. Limited on iADLs. Endorses sedentary lifestyle at home. Symptoms include long-term memory loss, confusion, and changes in gait. Differential diagnosis includes mixed dementia, or secondary causes for memory loss. MRI and blood tests planned...  - Order MRI of the brain without contrast (NeuroQuant)  - order labs to check Vit B12, Vit B1, Vit D, folate, Treponema Pallidum (syphilis) screening cascade, Phosphorylated Tau 217 (p-Tau 217), plasma ([facility] number [id number]), Beta Amyloid 42/40 ratio, Plasma (OjaR1283, [facility] number [id number]), APO Alzheimer's Risk (OjaR1884, [facility] number [id number])  - Set up speech therapy for cognitive training [facility])...   PAIN:  Are you having pain? No  FALLS: Has patient fallen in last 6 months?  No  LIVING ENVIRONMENT: Lives with: lives with their spouse Lives in: House/apartment  PLOF:  Level of assistance: Needed assistance with ADLs Employment: Retired   PATIENT GOALS   to learn about changes to memory  OBJECTIVE:  TODAY'S TREATMENT:   PATIENT REPORTED OUTCOME MEASURES (PROM):  MULTIFACTORIAL MEMORY QUESTIONNAIRE (MMQ)  Administered patient self-reported outcome measure Multifactorial Memory Questionnaire (MMQ). The Multifactorial Memory Questionnaire [facility]) consists of three scales measuring separate aspects of metamemory; Satisfaction, Ability and Strategy.   Pt's responses are converted to T-Scores with severity levels based on pt's T-Score.   Severity Levels (T-score) Very Low - < 20 Low - 20 to 29 Below Average - 30-39 Average - 40 to 60 Above Average - 60 to 70 High - 71 to 80 Very High - > 80  Pt reports:  Below Average - 30-39 Memory  Satisfaction (T-score: 44)  High - 71 to 80 Memory Ability (T-score: 72)  Low - 20 to 29 use of Memory Strategies (T-score: 33)  Pt and wife educated re: rationale for ST and a few  simple ways to promote cognitive functioning including routine, exercise, and learning/doing something new. Pt endorsed a fairly sedentary lifestyle where he spends most days watching TV. Pt with limited responsibility for appointments or finances. He does manage his medication without difficulty (per wife) and mows the lawn as needed. Results of PROM appear to be skewed given that pt reports a lack of general activity and dependence on wife for most cognitive tasks.        PATIENT EDUCATION: Education details: as above Person educated: Patient and Spouse Education method: Explanation Education comprehension: verbalized understanding and needs further education  HOME EXERCISE PROGRAM:        To be provided, as appropriate, in upcoming sessions    GOALS:  Goals reviewed with patient? Yes  SHORT TERM GOALS: Target date: 10 sessions  Pt will complete PROM re: memory deficits. Baseline: Goal status: MET   2.  Pt wilI utilize compensatory strategies for anomia in 4 of 5 mod/max assistance during structured tasks.   Baseline:  Goal status: INITIAL   3.  Pt and/or wife will report successful implementation of at least x2 strategies to improve functional attention and memory.  Baseline:  Goal status: INITIAL   4.  Pt and/or wife will verbalize x3 factors that may impact cognitive-communication. Baseline:  Goal status: INITIAL    LONG TERM GOALS: Target date: 12 weeks    Patient will demonstrate knowledge of appropriate activities to support cognitive and language function outside of ST with assistance from family.  Baseline:  Goal status: INITIAL     ASSESSMENT:  CLINICAL IMPRESSION:  Patient is a 83 y.o. male who was seen today for cognitive-communication treatment in setting of dementia. Pt's wife reports changes to long term and short term memory for ~2 years. Assessment completed initially via ACE-III. Based on initial assessment pt presents with  cognitive-communication deficits affecting attention, memory, visuospatial skills, executive functioning, and verbal fluency/wordfinding. See details of tx session today - including PROM results. Recommend course of skilled ST with emphasis on compensations and education.  OBJECTIVE IMPAIRMENTS include attention, memory, executive functioning, and expressive language. These impairments are limiting patient from managing appointments, managing finances, ADLs/IADLs, and effectively communicating at home and in community. Factors affecting potential to achieve goals and functional outcome are ability to learn/carryover information and severity of impairments.. Patient will benefit from skilled SLP services to address above impairments and improve overall function.  REHAB POTENTIAL: Fair - given diagnosis  PLAN: SLP FREQUENCY: 1-2x/week  SLP DURATION: 12 weeks  PLANNED INTERVENTIONS: Environmental controls, Cueing hierachy, Cognitive reorganization, Internal/external aids, Functional tasks, SLP instruction and feedback, Compensatory strategies, and Patient/family education    Delon Bangs, M.S., CCC-SLP Speech-Language Pathologist [facility] [phone] [place])  [facility] Outpatient Rehabilitation at [facility] [address] [place], [place], [address] Phone: [phone]   Fax:  [phone]

## 2024-02-12 ENCOUNTER — Encounter

## 2024-02-13 ENCOUNTER — Ambulatory Visit: Attending: Neurology

## 2024-02-13 DIAGNOSIS — R41841 Cognitive communication deficit: Secondary | ICD-10-CM | POA: Insufficient documentation

## 2024-02-13 NOTE — Therapy (Signed)
 OUTPATIENT SPEECH LANGUAGE PATHOLOGY  TREATMENT   Patient Name: Ray Fowler MRN: [id number] DOB:05/14/1941, 83 y.o., male Today's Date: 02/13/2024  PCP: Arlyss Solian, MD REFERRING PROVIDER: Jannett Fairly, MD   End of Session - 02/13/24 1347     Visit Number 3    Number of Visits 24    Date for SLP Re-Evaluation 04/28/24    SLP Start Time 1350    SLP Stop Time  1435    SLP Time Calculation (min) 45 min    Activity Tolerance Patient tolerated treatment well           Patient Active Problem List   Diagnosis Date Noted   History of CVA in adulthood 05/09/2022   Memory change 04/22/2022   Hydronephrosis of right kidney 10/15/2020   Complex renal cyst 02/22/2020   Nephrolithiasis 01/28/2019   Thrombocytopenia (HCC) 09/10/2018   Healthcare maintenance 08/28/2017   Centrilobular emphysema (HCC) 10/04/2016   Advance care planning 08/09/2014   Benign paroxysmal positional vertigo 06/06/2014   Hyperlipidemia 08/19/2012   Medicare annual wellness visit, subsequent 07/18/2012   History of prostate cancer 07/18/2012   Allergic rhinitis 07/18/2012   H/O ascending aorta repair 10/27/2011   PVC (premature ventricular contraction) 11/22/2009   BRUIT 05/27/2009   Secondary cardiomyopathy (HCC) 05/11/2009   Atrial flutter (HCC) 05/11/2009   CAD (coronary artery disease) 04/03/2009   Aortic valve disorder 04/03/2009   Essential hypertension 05/07/2007    ONSET DATE: ~2 years ago per wife report; referral date 01/15/24  REFERRING DIAG: mild cognitive impairment/concern for dementia  THERAPY DIAG:  Cognitive communication deficit  Rationale for Evaluation and Treatment Rehabilitation  SUBJECTIVE:   SUBJECTIVE STATEMENT: Pt alert and cooperate. Flat affect.   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 Patient still performing ADLs. Limited on iADLs. Endorses sedentary lifestyle at home. Symptoms include long-term memory loss, confusion, and changes in gait. Differential diagnosis includes mixed dementia, or secondary causes for memory loss. MRI and blood tests planned...  - Order MRI of the brain without contrast (NeuroQuant)  - order labs to check Vit B12, Vit B1, Vit D, folate, Treponema Pallidum (syphilis) screening cascade, Phosphorylated Tau 217 (p-Tau 217), plasma ([facility] number [id number]), Beta Amyloid 42/40 ratio, Plasma (OjaR1283, [facility] number [id number]), APO Alzheimer's Risk (OjaR1884, [facility] number [id number])  - Set up speech therapy for cognitive training [facility])...   PAIN:  Are you having pain? No  FALLS: Has patient fallen in last 6 months?  No  LIVING ENVIRONMENT: Lives with: lives with their spouse Lives in: House/apartment  PLOF:  Level of assistance: Needed assistance with ADLs Employment: Retired   PATIENT GOALS   to learn about changes to memory  OBJECTIVE:  TODAY'S TREATMENT:  Pt and wife educated re: ways to help prevent further cognitive decline in setting of vascular dementia. Emphasis placed on routine, social engagement, and mental and physical activity. Hand out provided. Pt tasked with implementing 10 minutes of mental and/or physical activity into daily life. Pt and wife identified playing Wii, reading to great grandson, and reminiscing about trips as activities to try.       PATIENT EDUCATION: Education details: as above Person educated: Patient and Spouse Education method: Explanation Education comprehension: verbalized understanding and needs further education  HOME EXERCISE PROGRAM:        To  be provided, as appropriate, in upcoming sessions    GOALS:  Goals reviewed with patient? Yes  SHORT TERM GOALS: Target date: 10 sessions  Pt will complete PROM  re: memory deficits. Baseline: Goal status: MET   2.  Pt wilI utilize compensatory strategies for anomia in 4 of 5 opportunities with mod/max assistance during structured tasks.   Baseline:  Goal status: INITIAL   3.  Pt and/or wife will report successful implementation of at least x2 strategies to improve functional attention and memory.  Baseline:  Goal status: INITIAL   4.  Pt and/or wife will verbalize x3 factors that may impact cognitive-communication. Baseline:  Goal status: INITIAL    LONG TERM GOALS: Target date: 12 weeks    Patient will demonstrate knowledge of appropriate activities to support cognitive and language function outside of ST with assistance from family.  Baseline:  Goal status: INITIAL     ASSESSMENT:  CLINICAL IMPRESSION:  Patient is a 83 y.o. male who was seen today for cognitive-communication treatment in setting of dementia. Pt's wife reports changes to long term and short term memory for ~2 years. Assessment completed initially via ACE-III. Based on initial assessment pt presents with cognitive-communication deficits affecting attention, memory, visuospatial skills, executive functioning, and verbal fluency/wordfinding. See details of tx session today. Recommend course of skilled ST with emphasis on compensations and education.  OBJECTIVE IMPAIRMENTS include attention, memory, executive functioning, and expressive language. These impairments are limiting patient from managing appointments, managing finances, ADLs/IADLs, and effectively communicating at home and in community. Factors affecting potential to achieve goals and functional outcome are ability to learn/carryover information and severity of impairments.. Patient will benefit from skilled SLP services to address above impairments and improve overall function.  REHAB POTENTIAL: Fair - given diagnosis  PLAN: SLP FREQUENCY: 1-2x/week  SLP DURATION: 12 weeks  PLANNED INTERVENTIONS:  Environmental controls, Cueing hierachy, Cognitive reorganization, Internal/external aids, Functional tasks, SLP instruction and feedback, Compensatory strategies, and Patient/family education    Delon Bangs, M.S., CCC-SLP Speech-Language Pathologist [facility] [phone] [place])  [facility] Outpatient Rehabilitation at [facility] [address] [place], [place], [address] Phone: [phone]   Fax:  [phone]

## 2024-02-17 ENCOUNTER — Ambulatory Visit

## 2024-02-17 DIAGNOSIS — R41841 Cognitive communication deficit: Secondary | ICD-10-CM

## 2024-02-17 NOTE — Therapy (Signed)
 OUTPATIENT SPEECH LANGUAGE PATHOLOGY DISCHARGE   Patient Name: Ray Fowler MRN: [id number] DOB:[birth year], 83 y.o., male Today's Date: 02/17/2024  PCP: Arlyss Solian, MD REFERRING PROVIDER: Jannett Fairly, MD   End of Session - 02/17/24 1451     Visit Number 4    Number of Visits 24    Date for SLP Re-Evaluation 04/28/24    SLP Start Time 1355    SLP Stop Time  1450    SLP Time Calculation (min) 55 min    Activity Tolerance Patient tolerated treatment well           Patient Active Problem List   Diagnosis Date Noted   History of CVA in adulthood 05/09/2022   Memory change 04/22/2022   Hydronephrosis of right kidney 10/15/2020   Complex renal cyst 02/22/2020   Nephrolithiasis 01/28/2019   Thrombocytopenia (HCC) 09/10/2018   Healthcare maintenance 08/28/2017   Centrilobular emphysema (HCC) 10/04/2016   Advance care planning 08/09/2014   Benign paroxysmal positional vertigo 06/06/2014   Hyperlipidemia 08/19/2012   Medicare annual wellness visit, subsequent 07/18/2012   History of prostate cancer 07/18/2012   Allergic rhinitis 07/18/2012   H/O ascending aorta repair 10/27/2011   PVC (premature ventricular contraction) 11/22/2009   BRUIT 05/27/2009   Secondary cardiomyopathy (HCC) 05/11/2009   Atrial flutter (HCC) 05/11/2009   CAD (coronary artery disease) 04/03/2009   Aortic valve disorder 04/03/2009   Essential hypertension 05/07/2007    ONSET DATE: ~2 years ago per wife report; referral date 01/15/24  REFERRING DIAG: mild cognitive impairment/concern for dementia  THERAPY DIAG:  Cognitive communication deficit  Rationale for Evaluation and Treatment Rehabilitation  SUBJECTIVE:   SUBJECTIVE STATEMENT: Pt alert and cooperate. Flat affect.   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 Patient still performing ADLs. Limited on iADLs. Endorses sedentary lifestyle at home. Symptoms include long-term memory loss, confusion, and changes in gait. Differential diagnosis includes mixed dementia, or secondary causes for memory loss. MRI and blood tests planned...  - Order MRI of the brain without contrast (NeuroQuant)  - order labs to check Vit B12, Vit B1, Vit D, folate, Treponema Pallidum (syphilis) screening cascade, Phosphorylated Tau 217 (p-Tau 217), plasma ([facility] number [id number]), Beta Amyloid 42/40 ratio, Plasma (OjaR1283, [facility] number [id number]), APO Alzheimer's Risk (OjaR1884, [facility] number [id number])  - Set up speech therapy for cognitive training [facility])...   PAIN:  Are you having pain? No  FALLS: Has patient fallen in last 6 months?  No  LIVING ENVIRONMENT: Lives with: lives with their spouse Lives in: House/apartment  PLOF:  Level of assistance: Needed assistance with ADLs Employment: Retired   PATIENT GOALS   to learn about changes to memory  OBJECTIVE:  TODAY'S TREATMENT:  Reviewed re: ways to help prevent further cognitive decline in setting of vascular dementia as well as basic attention and memory strategies. Emphasis placed on routine, social engagement, and mental and physical activity. Pt and wife state that they have a routine, have been reviewing daily events, are increasing physical and mental activity and maintaining social engagement.   Pt endorsed feeling foggy today; however, unable to describe despite further questioning. Pt's wife to keep a log of such s/sx (c/o fogginess, disorientation) as well as any precipitating factors for MD.   Wife tearful about pt's CLOF and lack of diagnosis. Supportive counseling  provided. Wife encouraged to talk to Neurologist given questions about diagnosis and what to expect.  Pt endorsed feeling like it was time to d/c from ST.  ST in agreement as education has been completed and pt/wife including attention, memory, and cognitive functioning strategies into daily life.         PATIENT EDUCATION: Education details: as above Person educated: Patient and Spouse Education method: Explanation Education comprehension: verbalized understanding and needs further education  HOME EXERCISE PROGRAM:        Incorporate compensations and education provided    GOALS:  Goals reviewed with patient? Yes  SHORT TERM GOALS: Target date: 10 sessions  Pt will complete PROM re: memory deficits. Baseline: Goal status: MET   2.  Pt wilI utilize compensatory strategies for anomia in 4 of 5 opportunities with mod/max assistance during structured tasks.   Baseline:  Goal status: ADEQUATE FOR D/C   3.  Pt and/or wife will report successful implementation of at least x2 strategies to improve functional attention and memory.  Baseline:  Goal status: MET   4.  Pt and/or wife will verbalize x3 factors that may impact cognitive-communication. Baseline:  Goal status: MET    LONG TERM GOALS: Target date: 12 weeks    Patient will demonstrate knowledge of appropriate activities to support cognitive and language function outside of ST with assistance from family.  Baseline:  Goal status: MET     ASSESSMENT:  CLINICAL IMPRESSION:  Patient is a 83 y.o. male who was seen today for cognitive-communication treatment in setting of dementia. Pt's wife reports changes to long term and short term memory for ~2 years. Assessment completed initially via ACE-III. Based on initial assessment pt presents with cognitive-communication deficits affecting attention, memory, visuospatial skills, executive functioning, and verbal fluency/wordfinding. See details of tx session today. Pt has completed a course of ST with emphasis on education and compensations.    PLAN: D/C ST; follow up with Neurology    Delon Bangs, M.S.,  CCC-SLP Speech-Language Pathologist [facility] [phone] [place])  [facility] at [facility] [address] [place], [place], [address] Phone: [phone]   Fax:  [phone]

## 2024-02-19 ENCOUNTER — Ambulatory Visit

## 2024-02-20 ENCOUNTER — Ambulatory Visit

## 2024-02-24 ENCOUNTER — Encounter

## 2024-02-26 ENCOUNTER — Encounter

## 2024-02-27 ENCOUNTER — Encounter

## 2024-02-28 ENCOUNTER — Encounter: Payer: Self-pay | Admitting: Advanced Practice Midwife

## 2024-03-04 ENCOUNTER — Encounter

## 2024-03-05 ENCOUNTER — Encounter

## 2024-03-09 ENCOUNTER — Encounter

## 2024-03-11 ENCOUNTER — Encounter

## 2024-03-12 ENCOUNTER — Encounter

## 2024-03-16 ENCOUNTER — Encounter

## 2024-03-18 ENCOUNTER — Encounter

## 2024-03-19 ENCOUNTER — Encounter

## 2024-03-23 ENCOUNTER — Encounter

## 2024-04-08 DIAGNOSIS — Z08 Encounter for follow-up examination after completed treatment for malignant neoplasm: Secondary | ICD-10-CM | POA: Diagnosis not present

## 2024-04-08 DIAGNOSIS — L821 Other seborrheic keratosis: Secondary | ICD-10-CM | POA: Diagnosis not present

## 2024-04-08 DIAGNOSIS — Z85828 Personal history of other malignant neoplasm of skin: Secondary | ICD-10-CM | POA: Diagnosis not present

## 2024-04-08 DIAGNOSIS — D225 Melanocytic nevi of trunk: Secondary | ICD-10-CM | POA: Diagnosis not present

## 2024-04-08 DIAGNOSIS — L814 Other melanin hyperpigmentation: Secondary | ICD-10-CM | POA: Diagnosis not present

## 2024-04-08 DIAGNOSIS — L570 Actinic keratosis: Secondary | ICD-10-CM | POA: Diagnosis not present

## 2024-04-08 DIAGNOSIS — Z85820 Personal history of malignant melanoma of skin: Secondary | ICD-10-CM | POA: Diagnosis not present

## 2024-04-28 DIAGNOSIS — H5203 Hypermetropia, bilateral: Secondary | ICD-10-CM | POA: Diagnosis not present

## 2024-04-28 DIAGNOSIS — H2513 Age-related nuclear cataract, bilateral: Secondary | ICD-10-CM | POA: Diagnosis not present

## 2024-04-28 DIAGNOSIS — H353131 Nonexudative age-related macular degeneration, bilateral, early dry stage: Secondary | ICD-10-CM | POA: Diagnosis not present

## 2024-05-20 DIAGNOSIS — R269 Unspecified abnormalities of gait and mobility: Secondary | ICD-10-CM | POA: Diagnosis not present

## 2024-05-20 DIAGNOSIS — E559 Vitamin D deficiency, unspecified: Secondary | ICD-10-CM | POA: Diagnosis not present

## 2024-05-20 DIAGNOSIS — R569 Unspecified convulsions: Secondary | ICD-10-CM | POA: Diagnosis not present

## 2024-05-20 DIAGNOSIS — Z8673 Personal history of transient ischemic attack (TIA), and cerebral infarction without residual deficits: Secondary | ICD-10-CM | POA: Diagnosis not present

## 2024-05-20 DIAGNOSIS — G3184 Mild cognitive impairment, so stated: Secondary | ICD-10-CM | POA: Diagnosis not present

## 2024-06-08 ENCOUNTER — Other Ambulatory Visit: Payer: Self-pay | Admitting: Cardiovascular Disease

## 2024-07-22 ENCOUNTER — Other Ambulatory Visit: Payer: Self-pay | Admitting: Cardiovascular Disease

## 2024-07-22 NOTE — Telephone Encounter (Signed)
Scheduled 01/02

## 2024-07-22 NOTE — Telephone Encounter (Signed)
Please contact pt for future appointment. 

## 2024-08-01 ENCOUNTER — Other Ambulatory Visit: Payer: Self-pay | Admitting: Cardiovascular Disease

## 2024-08-03 ENCOUNTER — Other Ambulatory Visit: Payer: Self-pay | Admitting: Cardiovascular Disease

## 2024-08-13 NOTE — Progress Notes (Unsigned)
 Cardiology Office Note  Date:  08/14/2024   ID:  Ladarien, Beeks 1941/03/10, MRN [id number]  PCP:  Cleatus Arlyss RAMAN, MD   Chief Complaint  Patient presents with   Follow-up    12 month f/u no complaints today. Meds reviewed verbally with pt.    HPI:  Ray Fowler is a 84 y/o male with h/o HTN,   hyperlipidemia,  s/p Type Id hybrid arch repair in 10/2011 s/p ascending aortic and hemi-arch replacement for acute type A dissection in 03/2009.  complicated by cardiac tamponade,  CT showed extension of the dissection from the aortic root all the way down to the femoral arteries,  s/p aortic root replacement of hemiarch repair and aortic valve resuspension.  Post-op course complicated by persistent HTN and atrial flutter treated with amio (stopped in January 2011).   Followed by cardiothoracic surgery at [facility] on an annual basis with CT scan surveillance. He presents for routine followup of his of his hypertension and aortic dissection  Last seen by myself in clinic November 2024 Last seen by vascular at [facility] February 2024 CT scan at that time, aorta relatively unchanged  In follow-up today wife does most of the talking She reports that he has mild dementia, followed by neurology  He has balance problems, gait instability, afraid of falling, No exercise, periodically uses a cane  Overall he reports no new symptoms, no complaints No chest pain, no leg pain, denies shortness of breath or chest pain on exertion  Lab work reviewed Total chol 135, LDL39   EKG personally reviewed by myself on todays visit EKG Interpretation Date/Time:  Friday August 14 2024 13:44:18 EST Ventricular Rate:  65 PR Interval:  206 QRS Duration:  88 QT Interval:  414 QTC Calculation: 430 R Axis:   46  Text Interpretation: Normal sinus rhythm Normal ECG When compared with ECG of 02-Jul-2023 11:03, Premature atrial complexes are no longer Present Nonspecific T wave abnormality has  replaced inverted T waves in Inferior leads Nonspecific T wave abnormality no longer evident in Lateral leads Confirmed by Perla Lye [phone]) on 08/14/2024 1:52:25 PM   Echocardiogram November 2023 Normal ejection fraction, normal RV size and function No significant valvular heart disease  Carotid ultrasound Patent vessels including graft  Other past medical history reviewed chronic dizziness Take meclizine  as needed  Lab Results  Component Value Date   CHOL 135 11/14/2023   HDL 37.50 (L) 11/14/2023   LDLCALC 39 11/14/2023   TRIG 296.0 (H) 11/14/2023    Echo MILD LV DYSFUNCTION (See above) WITH MILD LVH, 50%   NORMAL LA PRESSURES WITH DIASTOLIC DYSFUNCTION   NORMAL RIGHT VENTRICULAR SYSTOLIC FUNCTION   VALVULAR REGURGITATION: MILD AR, TRIVIAL MR, TRIVIAL PR, TRIVIAL TR   NO VALVULAR STENOSIS  In hospital 08/21/2016 to 1/15 Had flu and PNA 08/2016 CT 08/26/2016 at [facility]   Prior ECHO showed EF 45-50%. Had cardiac CT in 12/10 after abnormal myvoiew. Coronaries normal except for 25% RCA. 6mm stable pulmonary nodule.    echo (02/2010) EF 55-60% with grade 1 diastolic dysfx and previous AO dissection flap.    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  PSH:    Past Surgical History:  Procedure Laterality Date   ABDOMINAL AORTIC ANEURYSM REPAIR  march 2013   Axillary Mass removal  05/2000   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[place])    CARDIAC SURGERY  2013   at [facility]- transverse aortic arch/descending aneurysm repair s/p debranching procedure  (total of 5 Gore C-TAG devices extending from distal descending aorta to his prev placed ascending dacron graft)   COLONOSCOPY     Flex- Polyp   06/1999   at 38cm, Int/Ext Hemm divertics [place])   Hemi Arch Replacement  03/2009 and repeat 10/2011   and AoV resuspension w/ Repl of Ascending Aorta and Hemiarch w/ Graft; Pericardial Tamponade Aortic Regurg 8/22-8/28/10;  endovascular repair of aortic dilation 2013 with  ascending aorta to L common carotid, R subclavian, R common carotid and L carotid subclavian bypass   [facility]  08/22-8/28/2010   Thoracic aortic dissection pericardial teamponade aortic regurg   INGUINAL HERNIA REPAIR Right 12/24/2017   Procedure: HERNIA REPAIR INGUINAL ADULT;  Surgeon: Tanda Locus, MD;  Location: [facility];  Service: General;  Laterality: Right;   INSERTION OF MESH Right 12/24/2017   Procedure: INSERTION OF MESH;  Surgeon: Tanda Locus, MD;  Location: [facility];  Service: General;  Laterality: Right;   melanoma removed from scalp  06/2006   [place]   PENILE PROSTHESIS IMPLANT  1996   POLYPECTOMY     PROSTATECTOMY  12/1993   Dr. Alline   WISDOM TOOTH EXTRACTION  10/2005    Current Outpatient Medications  Medication Sig Dispense Refill   amLODipine  (NORVASC ) 5 MG tablet TAKE 1 TABLET BY MOUTH DAILY 90 tablet 0   aspirin  EC 81 MG tablet Take 1 tablet (81 mg total) by mouth daily. Swallow whole.     atorvastatin  (LIPITOR) 20 MG tablet TAKE 1 TABLET (20 MG TOTAL) BY MOUTH AT BEDTIME. 90 tablet 0   cholecalciferol (VITAMIN D3) 25 MCG (1000 UNIT) tablet Take 1,000 Units by mouth daily.     FLOVENT  HFA 110 MCG/ACT inhaler INHALE 1 PUFF INTO THE LUNGS TWICE DAILY*RINSE MOUTH AFTER USE* 12 g 2   fluticasone  (FLONASE ) 50 MCG/ACT nasal spray USE 1 SPRAY IN EACH NOSTRIL ONCE DAILY AS NEEDED FOR CONGESTION 16 g 3   hydrochlorothiazide  (HYDRODIURIL ) 25 MG tablet  TAKE 1 TABLET (25 MG TOTAL) BY MOUTH EVERY OTHER DAY. 45 tablet 0   Lutein-Zeaxanthin 25-5 MG CAPS Take 1 capsule by mouth daily.     meclizine  (ANTIVERT ) 25 MG tablet Take 1 tablet (25 mg total) by mouth 3 (three) times daily as needed for dizziness.     memantine  (NAMENDA ) 10 MG tablet Take 1 tablet (10 mg total) by mouth 2 (two) times daily. 180 tablet 3   Nebivolol  HCl 20 MG TABS TAKE ONE TABLET (20 MG TOTAL) BY MOUTH DAILY. 90 tablet 0   promethazine  (PHENERGAN ) 25 MG tablet TAKE ONE TABLET BY MOUTH EVERY SIX HOURS AS NEEDED FOR NAUSEA OR VERTIGO 30 tablet 3   No current facility-administered medications for this visit.     Allergies:   Amlodipine , Donepezil , Lactose intolerance (gi), and Quinolones   Social History:  The patient  reports that he quit smoking about 30 years ago. His smoking use included cigarettes. He started smoking about 55 years ago. He has a 25 pack-year smoking history. He has never used smokeless tobacco. He reports current alcohol use. He reports that  he does not use drugs.   Family History:   family history includes Colon polyps in his sister; Diabetes in some other family members; Heart disease in his father and mother; Heart failure in his mother; Hyperlipidemia in his daughter; Hypertension in his mother; Kidney disease in his father; Kidney failure in his father; Prostate cancer in his father; Stroke in his father.    Review of Systems: Review of Systems  Constitutional: Negative.   HENT: Negative.    Cardiovascular: Negative.   Gastrointestinal: Negative.   Musculoskeletal: Negative.   Neurological: Negative.   Psychiatric/Behavioral: Negative.    All other systems reviewed and are negative.   PHYSICAL EXAM: VS:  BP 138/80 (BP Location: Left Arm, Patient Position: Sitting, Cuff Size: Normal)   Pulse 65   Ht 6' (1.829 m)   Wt 170 lb 2 oz (77.2 kg)   SpO2 98%   BMI 23.07 kg/m  , BMI Body mass index is 23.07 kg/m. Constitutional:  oriented to  person, place, and time. No distress.  HENT:  Head: Grossly normal Eyes:  no discharge. No scleral icterus.  Neck: No JVD, no carotid bruits  Cardiovascular: Regular rate and rhythm, no murmurs appreciated Pulmonary/Chest: Clear to auscultation bilaterally, no wheezes or rails Abdominal: Soft.  no distension.  no tenderness.  Musculoskeletal: Normal range of motion Neurological:  normal muscle tone. Coordination normal. No atrophy Skin: Skin warm and dry Psychiatric: normal affect, pleasant  Recent Labs: 11/14/2023: ALT 18; BUN 22; Creatinine, Ser 1.11; Hemoglobin 15.4; Platelets 177.0; Potassium 3.9; Sodium 142; TSH 1.83    Lipid Panel Lab Results  Component Value Date   CHOL 135 11/14/2023   HDL 37.50 (L) 11/14/2023   LDLCALC 39 11/14/2023   TRIG 296.0 (H) 11/14/2023     Wt Readings from Last 3 Encounters:  08/14/24 170 lb 2 oz (77.2 kg)  12/06/23 169 lb (76.7 kg)  11/14/23 169 lb (76.7 kg)    ASSESSMENT AND PLAN:  Essential hypertension -  Blood pressure is well controlled on today's visit. No changes made to the medications.  Coronary artery disease involving native coronary artery of native heart without angina pectoris Currently with no symptoms of angina. No further workup at this time. Continue current medication regimen.  Frequent PVCs Asymptomatic, not noted today On beta-blocker  Dissection of thoracic aorta (HCC), H/O ascending aorta repair Followed at [facility], scan every 24 months or so Prior scans reviewed, stable Wife is indicated she prefers not to drive down to [facility] if possible, would like to have CT scan performed locally if approved by vascular at [facility]  Debility/leg weakness Recommend regular walking program Sedentary at baseline  Orders Placed This Encounter  Procedures   EKG 12-Lead     Signed, Velinda Lunger, M.D., Ph.D. 08/14/2024  [facility] [place], [place] [phone]

## 2024-08-14 ENCOUNTER — Ambulatory Visit: Attending: Cardiovascular Disease | Admitting: Cardiovascular Disease

## 2024-08-14 ENCOUNTER — Encounter: Payer: Self-pay | Admitting: Cardiovascular Disease

## 2024-08-14 VITALS — BP 138/80 | HR 65 | Ht 72.0 in | Wt 170.1 lb

## 2024-08-14 DIAGNOSIS — I359 Nonrheumatic aortic valve disorder, unspecified: Secondary | ICD-10-CM | POA: Diagnosis not present

## 2024-08-14 DIAGNOSIS — I10 Essential (primary) hypertension: Secondary | ICD-10-CM | POA: Diagnosis not present

## 2024-08-14 DIAGNOSIS — E782 Mixed hyperlipidemia: Secondary | ICD-10-CM

## 2024-08-14 DIAGNOSIS — I25118 Atherosclerotic heart disease of native coronary artery with other forms of angina pectoris: Secondary | ICD-10-CM | POA: Diagnosis not present

## 2024-08-14 DIAGNOSIS — I4892 Unspecified atrial flutter: Secondary | ICD-10-CM

## 2024-08-14 DIAGNOSIS — Z9889 Other specified postprocedural states: Secondary | ICD-10-CM

## 2024-08-14 MED ORDER — HYDROCHLOROTHIAZIDE 25 MG PO TABS: 25.0000 mg | ORAL_TABLET | ORAL | 0 refills | Status: AC

## 2024-08-14 MED ORDER — NEBIVOLOL HCL 20 MG PO TABS: 20.0000 mg | ORAL_TABLET | Freq: Every day | ORAL | 3 refills | Status: AC

## 2024-08-14 NOTE — Patient Instructions (Signed)

## 2024-09-08 ENCOUNTER — Other Ambulatory Visit: Payer: Self-pay | Admitting: Family Medicine

## 2024-09-08 NOTE — Telephone Encounter (Signed)
 Pt needs yearly exam with Dr Cleatus in early April.

## 2024-09-08 NOTE — Telephone Encounter (Signed)
 scheduled yearly exam pt wife asking about memory lost meds was it sent in  to pharmacy

## 2024-09-08 NOTE — Telephone Encounter (Signed)
 Advised Pt's wife refill was sent in to the pharmacy.

## 2024-11-10 ENCOUNTER — Other Ambulatory Visit

## 2024-11-17 ENCOUNTER — Encounter: Admitting: Family Medicine

## 2024-12-10 ENCOUNTER — Ambulatory Visit

## 2024-12-14 ENCOUNTER — Ambulatory Visit
